# Patient Record
Sex: Female | Born: 1945 | Race: White | Hispanic: No | Smoking: Never smoker
Health system: Southern US, Community
[De-identification: ages and names within clinical notes are randomized; demographics above are authoritative.]

## PROBLEM LIST (undated history)

## (undated) ENCOUNTER — Emergency Department (HOSPITAL_COMMUNITY): Disposition: A | Discharge status: Home / Self Care | Payer: Commercial Managed Care - HMO

## (undated) DIAGNOSIS — Z23 Encounter for immunization: Secondary | ICD-10-CM

## (undated) DIAGNOSIS — I499 Cardiac arrhythmia, unspecified: Secondary | ICD-10-CM

## (undated) DIAGNOSIS — M8580 Other specified disorders of bone density and structure, unspecified site: Secondary | ICD-10-CM

## (undated) DIAGNOSIS — B3789 Other sites of candidiasis: Secondary | ICD-10-CM

## (undated) DIAGNOSIS — F32A Depression, unspecified: Secondary | ICD-10-CM

## (undated) DIAGNOSIS — Z9889 Other specified postprocedural states: Secondary | ICD-10-CM

## (undated) DIAGNOSIS — R200 Anesthesia of skin: Secondary | ICD-10-CM

## (undated) DIAGNOSIS — I10 Essential (primary) hypertension: Secondary | ICD-10-CM

## (undated) DIAGNOSIS — I82402 Acute embolism and thrombosis of unspecified deep veins of left lower extremity: Secondary | ICD-10-CM

## (undated) DIAGNOSIS — K648 Other hemorrhoids: Secondary | ICD-10-CM

## (undated) DIAGNOSIS — F329 Major depressive disorder, single episode, unspecified: Secondary | ICD-10-CM

## (undated) DIAGNOSIS — Z8719 Personal history of other diseases of the digestive system: Secondary | ICD-10-CM

## (undated) DIAGNOSIS — M179 Osteoarthritis of knee, unspecified: Secondary | ICD-10-CM

## (undated) DIAGNOSIS — K219 Gastro-esophageal reflux disease without esophagitis: Secondary | ICD-10-CM

## (undated) DIAGNOSIS — R112 Nausea with vomiting, unspecified: Secondary | ICD-10-CM

## (undated) DIAGNOSIS — J189 Pneumonia, unspecified organism: Secondary | ICD-10-CM

## (undated) DIAGNOSIS — Z9289 Personal history of other medical treatment: Secondary | ICD-10-CM

## (undated) DIAGNOSIS — M1710 Unilateral primary osteoarthritis, unspecified knee: Secondary | ICD-10-CM

## (undated) HISTORY — DX: Acute embolism and thrombosis of unspecified deep veins of left lower extremity: I82.402

## (undated) HISTORY — DX: Other specified disorders of bone density and structure, unspecified site: M85.80

## (undated) HISTORY — DX: Unilateral primary osteoarthritis, unspecified knee: M17.10

## (undated) HISTORY — DX: Major depressive disorder, single episode, unspecified: F32.9

## (undated) HISTORY — DX: Encounter for immunization: Z23

## (undated) HISTORY — PX: OTHER SURGICAL HISTORY: SHX169

## (undated) HISTORY — DX: Essential (primary) hypertension: I10

## (undated) HISTORY — DX: Other hemorrhoids: K64.8

## (undated) HISTORY — PX: CHOLECYSTECTOMY: SHX55

## (undated) HISTORY — DX: Osteoarthritis of knee, unspecified: M17.9

## (undated) HISTORY — DX: Depression, unspecified: F32.A

## (undated) HISTORY — DX: Other sites of candidiasis: B37.89

## (undated) HISTORY — DX: Personal history of other medical treatment: Z92.89

---

## 1999-09-13 ENCOUNTER — Ambulatory Visit (HOSPITAL_COMMUNITY): Admission: RE | Admit: 1999-09-13 | Discharge: 1999-09-13 | Payer: Self-pay | Admitting: Internal Medicine

## 2000-08-24 ENCOUNTER — Other Ambulatory Visit: Admission: RE | Admit: 2000-08-24 | Discharge: 2000-08-24 | Payer: Self-pay | Admitting: Internal Medicine

## 2004-11-02 ENCOUNTER — Ambulatory Visit: Payer: Self-pay | Admitting: Internal Medicine

## 2004-11-16 ENCOUNTER — Ambulatory Visit: Payer: Self-pay | Admitting: Internal Medicine

## 2004-12-21 ENCOUNTER — Ambulatory Visit: Payer: Self-pay | Admitting: Internal Medicine

## 2005-02-15 ENCOUNTER — Ambulatory Visit: Payer: Self-pay | Admitting: Internal Medicine

## 2005-02-22 ENCOUNTER — Encounter (INDEPENDENT_AMBULATORY_CARE_PROVIDER_SITE_OTHER): Payer: Self-pay | Admitting: *Deleted

## 2005-02-22 ENCOUNTER — Ambulatory Visit (HOSPITAL_COMMUNITY): Admission: RE | Admit: 2005-02-22 | Discharge: 2005-02-22 | Payer: Self-pay | Admitting: Internal Medicine

## 2005-03-01 ENCOUNTER — Ambulatory Visit: Payer: Self-pay | Admitting: Internal Medicine

## 2005-04-26 ENCOUNTER — Ambulatory Visit: Payer: Self-pay | Admitting: Internal Medicine

## 2005-09-16 ENCOUNTER — Ambulatory Visit: Payer: Self-pay | Admitting: Internal Medicine

## 2005-09-26 ENCOUNTER — Ambulatory Visit: Payer: Self-pay | Admitting: Internal Medicine

## 2005-10-05 ENCOUNTER — Ambulatory Visit: Payer: Self-pay | Admitting: Internal Medicine

## 2006-02-22 ENCOUNTER — Ambulatory Visit: Payer: Self-pay | Admitting: Hospitalist

## 2006-02-22 ENCOUNTER — Ambulatory Visit (HOSPITAL_COMMUNITY): Admission: RE | Admit: 2006-02-22 | Discharge: 2006-02-22 | Payer: Self-pay | Admitting: Hospitalist

## 2006-03-01 ENCOUNTER — Ambulatory Visit: Payer: Self-pay | Admitting: Internal Medicine

## 2006-04-06 ENCOUNTER — Ambulatory Visit: Payer: Self-pay | Admitting: Internal Medicine

## 2006-04-06 ENCOUNTER — Encounter (INDEPENDENT_AMBULATORY_CARE_PROVIDER_SITE_OTHER): Payer: Self-pay | Admitting: *Deleted

## 2006-04-06 LAB — CONVERTED CEMR LAB
CO2: 25 meq/L (ref 19–32)
Calcium: 9.5 mg/dL (ref 8.4–10.5)
Creatinine, Ser: 0.61 mg/dL (ref 0.40–1.20)
Glucose, Bld: 105 mg/dL — ABNORMAL HIGH (ref 70–99)

## 2006-04-17 ENCOUNTER — Ambulatory Visit (HOSPITAL_COMMUNITY): Admission: RE | Admit: 2006-04-17 | Discharge: 2006-04-17 | Payer: Self-pay | Admitting: Internal Medicine

## 2006-04-19 ENCOUNTER — Ambulatory Visit: Payer: Self-pay | Admitting: Hospitalist

## 2006-04-19 ENCOUNTER — Encounter (INDEPENDENT_AMBULATORY_CARE_PROVIDER_SITE_OTHER): Payer: Self-pay | Admitting: *Deleted

## 2006-04-19 LAB — CONVERTED CEMR LAB
Glucose, Bld: 109 mg/dL — ABNORMAL HIGH (ref 70–99)
LDL Cholesterol: 100 mg/dL — ABNORMAL HIGH (ref 0–99)

## 2006-05-19 ENCOUNTER — Emergency Department (HOSPITAL_COMMUNITY): Admission: EM | Admit: 2006-05-19 | Discharge: 2006-05-19 | Payer: Self-pay | Admitting: Emergency Medicine

## 2006-05-24 ENCOUNTER — Ambulatory Visit (HOSPITAL_COMMUNITY): Admission: RE | Admit: 2006-05-24 | Discharge: 2006-05-24 | Payer: Self-pay | Admitting: Sports Medicine

## 2006-07-03 DIAGNOSIS — I10 Essential (primary) hypertension: Secondary | ICD-10-CM

## 2006-07-03 DIAGNOSIS — M1990 Unspecified osteoarthritis, unspecified site: Secondary | ICD-10-CM | POA: Insufficient documentation

## 2006-07-04 ENCOUNTER — Ambulatory Visit: Payer: Self-pay | Admitting: Internal Medicine

## 2006-07-04 LAB — CONVERTED CEMR LAB
Blood in Urine, dipstick: NEGATIVE
Glucose, Urine, Semiquant: NEGATIVE
Ketones, urine, test strip: NEGATIVE
Specific Gravity, Urine: <1.005
WBC Urine, dipstick: NEGATIVE
pH: 7.5

## 2006-07-11 ENCOUNTER — Ambulatory Visit: Payer: Self-pay | Admitting: Internal Medicine

## 2006-07-11 ENCOUNTER — Encounter (INDEPENDENT_AMBULATORY_CARE_PROVIDER_SITE_OTHER): Payer: Self-pay | Admitting: *Deleted

## 2006-07-11 LAB — CONVERTED CEMR LAB
Chloride: 100 meq/L (ref 96–112)
Creatinine, Ser: 0.64 mg/dL (ref 0.40–1.20)

## 2006-07-17 ENCOUNTER — Emergency Department (HOSPITAL_COMMUNITY): Admission: EM | Admit: 2006-07-17 | Discharge: 2006-07-17 | Payer: Self-pay | Admitting: Family Medicine

## 2006-07-21 ENCOUNTER — Emergency Department (HOSPITAL_COMMUNITY): Admission: EM | Admit: 2006-07-21 | Discharge: 2006-07-21 | Payer: Self-pay | Admitting: Emergency Medicine

## 2006-09-07 ENCOUNTER — Ambulatory Visit: Payer: Self-pay | Admitting: Internal Medicine

## 2006-09-07 DIAGNOSIS — F341 Dysthymic disorder: Secondary | ICD-10-CM

## 2006-12-01 ENCOUNTER — Telehealth (INDEPENDENT_AMBULATORY_CARE_PROVIDER_SITE_OTHER): Payer: Self-pay | Admitting: *Deleted

## 2006-12-29 ENCOUNTER — Ambulatory Visit: Payer: Self-pay | Admitting: Internal Medicine

## 2007-04-09 ENCOUNTER — Ambulatory Visit: Payer: Self-pay | Admitting: Internal Medicine

## 2007-04-09 ENCOUNTER — Encounter (INDEPENDENT_AMBULATORY_CARE_PROVIDER_SITE_OTHER): Payer: Self-pay | Admitting: *Deleted

## 2007-04-09 LAB — CONVERTED CEMR LAB
BUN: 12 mg/dL (ref 6–23)
Chloride: 102 meq/L (ref 96–112)
Potassium: 4.1 meq/L (ref 3.5–5.3)
Sodium: 141 meq/L (ref 135–145)

## 2007-06-18 ENCOUNTER — Ambulatory Visit (HOSPITAL_COMMUNITY): Admission: RE | Admit: 2007-06-18 | Discharge: 2007-06-18 | Payer: Self-pay | Admitting: *Deleted

## 2007-10-15 ENCOUNTER — Ambulatory Visit: Payer: Self-pay | Admitting: *Deleted

## 2007-10-15 ENCOUNTER — Encounter (INDEPENDENT_AMBULATORY_CARE_PROVIDER_SITE_OTHER): Payer: Self-pay | Admitting: *Deleted

## 2007-11-08 ENCOUNTER — Ambulatory Visit: Payer: Self-pay | Admitting: Internal Medicine

## 2007-11-08 ENCOUNTER — Encounter (INDEPENDENT_AMBULATORY_CARE_PROVIDER_SITE_OTHER): Payer: Self-pay | Admitting: *Deleted

## 2008-03-03 ENCOUNTER — Encounter (INDEPENDENT_AMBULATORY_CARE_PROVIDER_SITE_OTHER): Payer: Self-pay | Admitting: *Deleted

## 2008-03-03 ENCOUNTER — Ambulatory Visit: Payer: Self-pay | Admitting: Internal Medicine

## 2008-04-04 ENCOUNTER — Ambulatory Visit: Payer: Self-pay | Admitting: Internal Medicine

## 2008-04-17 ENCOUNTER — Encounter (INDEPENDENT_AMBULATORY_CARE_PROVIDER_SITE_OTHER): Payer: Self-pay | Admitting: *Deleted

## 2008-04-17 ENCOUNTER — Ambulatory Visit: Payer: Self-pay | Admitting: *Deleted

## 2008-04-17 LAB — CONVERTED CEMR LAB
Albumin: 3.9 g/dL (ref 3.5–5.2)
Alkaline Phosphatase: 62 units/L (ref 39–117)
BUN: 10 mg/dL (ref 6–23)
CO2: 26 meq/L (ref 19–32)
Calcium: 9.3 mg/dL (ref 8.4–10.5)
Chloride: 104 meq/L (ref 96–112)
Cholesterol: 163 mg/dL (ref 0–200)
Glucose, Bld: 95 mg/dL (ref 70–99)
HDL: 52 mg/dL (ref >39)
Potassium: 4 meq/L (ref 3.5–5.3)
Sodium: 142 meq/L (ref 135–145)
Total Protein: 6.8 g/dL (ref 6.0–8.3)
Triglycerides: 188 mg/dL — ABNORMAL HIGH (ref <150)

## 2008-05-05 ENCOUNTER — Ambulatory Visit (HOSPITAL_COMMUNITY): Admission: RE | Admit: 2008-05-05 | Discharge: 2008-05-05 | Payer: Self-pay | Admitting: Infectious Diseases

## 2008-05-08 ENCOUNTER — Ambulatory Visit (HOSPITAL_COMMUNITY): Admission: RE | Admit: 2008-05-08 | Discharge: 2008-05-08 | Payer: Self-pay | Admitting: Infectious Diseases

## 2008-05-08 ENCOUNTER — Encounter (INDEPENDENT_AMBULATORY_CARE_PROVIDER_SITE_OTHER): Payer: Self-pay | Admitting: *Deleted

## 2008-06-10 ENCOUNTER — Telehealth (INDEPENDENT_AMBULATORY_CARE_PROVIDER_SITE_OTHER): Payer: Self-pay | Admitting: *Deleted

## 2008-06-30 ENCOUNTER — Ambulatory Visit: Payer: Self-pay | Admitting: Internal Medicine

## 2008-06-30 ENCOUNTER — Encounter (INDEPENDENT_AMBULATORY_CARE_PROVIDER_SITE_OTHER): Payer: Self-pay | Admitting: *Deleted

## 2008-06-30 LAB — HM PAP SMEAR: HM Pap smear: NEGATIVE

## 2008-12-24 ENCOUNTER — Ambulatory Visit (HOSPITAL_COMMUNITY): Admission: RE | Admit: 2008-12-24 | Discharge: 2008-12-24 | Payer: Self-pay | Admitting: Internal Medicine

## 2008-12-24 ENCOUNTER — Ambulatory Visit: Payer: Self-pay | Admitting: Internal Medicine

## 2008-12-24 DIAGNOSIS — M25519 Pain in unspecified shoulder: Secondary | ICD-10-CM

## 2009-01-01 ENCOUNTER — Ambulatory Visit: Payer: Self-pay | Admitting: Internal Medicine

## 2009-01-02 ENCOUNTER — Ambulatory Visit: Payer: Self-pay | Admitting: Family Medicine

## 2009-01-08 ENCOUNTER — Ambulatory Visit (HOSPITAL_COMMUNITY): Admission: RE | Admit: 2009-01-08 | Discharge: 2009-01-08 | Payer: Self-pay | Admitting: Internal Medicine

## 2009-03-30 ENCOUNTER — Ambulatory Visit: Payer: Self-pay | Admitting: Internal Medicine

## 2009-03-30 DIAGNOSIS — K921 Melena: Secondary | ICD-10-CM

## 2009-03-30 LAB — CONVERTED CEMR LAB
ALT: 15 units/L (ref 0–35)
AST: 16 units/L (ref 0–37)
BUN: 10 mg/dL (ref 6–23)
Calcium: 9.4 mg/dL (ref 8.4–10.5)
Chloride: 100 meq/L (ref 96–112)
Creatinine, Ser: 0.62 mg/dL (ref 0.40–1.20)
HCT: 39.7 % (ref 36.0–46.0)
HDL: 62 mg/dL (ref >39)
Hemoglobin: 13 g/dL (ref 12.0–15.0)
MCHC: 32.7 g/dL (ref 30.0–36.0)
MCV: 91.3 fL (ref >=78.0)
Platelets: 332 10*3/uL (ref 150–400)
RDW: 13.8 % (ref 11.5–15.5)
Total Bilirubin: 0.4 mg/dL (ref 0.3–1.2)
Total CHOL/HDL Ratio: 3.3
VLDL: 39 mg/dL (ref 0–40)

## 2009-07-21 ENCOUNTER — Telehealth: Payer: Self-pay | Admitting: *Deleted

## 2009-07-21 ENCOUNTER — Encounter (INDEPENDENT_AMBULATORY_CARE_PROVIDER_SITE_OTHER): Payer: Self-pay | Admitting: Internal Medicine

## 2009-09-02 ENCOUNTER — Telehealth (INDEPENDENT_AMBULATORY_CARE_PROVIDER_SITE_OTHER): Payer: Self-pay | Admitting: Internal Medicine

## 2009-09-15 ENCOUNTER — Ambulatory Visit: Payer: Self-pay | Admitting: Internal Medicine

## 2009-09-15 LAB — CONVERTED CEMR LAB
ALT: 29 units/L (ref 0–35)
AST: 22 units/L (ref 0–37)
Alkaline Phosphatase: 74 units/L (ref 39–117)
Basophils Relative: 0 % (ref 0–1)
Creatinine, Ser: 0.6 mg/dL (ref 0.40–1.20)
Eosinophils Absolute: 0.3 10*3/uL (ref 0.0–0.7)
MCHC: 32.7 g/dL (ref 30.0–36.0)
MCV: 91.3 fL (ref >=78.0)
Monocytes Relative: 5 % (ref 3–12)
Neutro Abs: 5.2 10*3/uL (ref 1.7–7.7)
Neutrophils Relative %: 65 % (ref 43–77)
Platelets: 326 10*3/uL (ref 150–400)
RBC: 4.25 M/uL (ref 3.87–5.11)
Sodium: 139 meq/L (ref 135–145)
Total Bilirubin: 0.4 mg/dL (ref 0.3–1.2)
Total Protein: 7 g/dL (ref 6.0–8.3)
WBC: 7.9 10*3/uL (ref 4.0–10.5)

## 2009-09-28 ENCOUNTER — Ambulatory Visit: Payer: Self-pay | Admitting: Family Medicine

## 2010-02-22 ENCOUNTER — Ambulatory Visit: Payer: Self-pay | Admitting: Internal Medicine

## 2010-05-03 ENCOUNTER — Telehealth: Payer: Self-pay | Admitting: Internal Medicine

## 2010-06-07 ENCOUNTER — Ambulatory Visit: Payer: Self-pay | Admitting: Internal Medicine

## 2010-06-07 DIAGNOSIS — G4700 Insomnia, unspecified: Secondary | ICD-10-CM | POA: Insufficient documentation

## 2010-06-08 LAB — CONVERTED CEMR LAB
BUN: 9 mg/dL (ref 6–23)
Creatinine, Ser: 0.61 mg/dL (ref 0.40–1.20)
Eosinophils Absolute: 0.4 10*3/uL (ref 0.0–0.7)
Eosinophils Relative: 4 % (ref 0–5)
Glucose, Bld: 102 mg/dL — ABNORMAL HIGH (ref 70–99)
HCT: 41.5 % (ref 36.0–46.0)
Hemoglobin: 13.4 g/dL (ref 12.0–15.0)
Lymphs Abs: 2.6 10*3/uL (ref 0.7–4.0)
MCHC: 32.3 g/dL (ref 30.0–36.0)
MCV: 92.4 fL (ref 78.0–100.0)
Monocytes Absolute: 0.4 10*3/uL (ref 0.1–1.0)
Monocytes Relative: 5 % (ref 3–12)
Neutrophils Relative %: 60 % (ref 43–77)
Potassium: 3.9 meq/L (ref 3.5–5.3)
RBC: 4.49 M/uL (ref 3.87–5.11)
WBC: 8.5 10*3/uL (ref 4.0–10.5)

## 2010-07-06 NOTE — Assessment & Plan Note (Signed)
Summary: EST-CK/FU/MEDS/CFB   Vital Signs:  Patient profile:   65 year old female Height:      63 inches (160.02 cm) Weight:      236.8 pounds (107.64 kg) BMI:     42.10 Temp:     98.0 degrees F oral Pulse rate:   76 / minute BP sitting:   114 / 65  (right arm)  Vitals Entered By: Chinita Pester RN (September 15, 2009 3:57 PM) CC: Medication refills.  Referral to Sports Med. forsteroid injections. Is Patient Diabetic? No Pain Assessment Patient in pain? yes     Location: right shoulder Intensity: 9 Type: sharp Onset of pain  Chronic Nutritional Status BMI of > 30 = obese  Have you ever been in a relationship where you felt threatened, hurt or afraid?No   Does patient need assistance? Functional Status Self care Ambulation Normal   Primary Care Provider:  Olene Craven MD  CC:  Medication refills.  Referral to Sports Med. forsteroid injections..  History of Present Illness: 65yof with pmh outlined in this chart.  She is here because her right shoulder hurts again due to arthritis, she went to dr. Jennette Kettle for this and recieved steriod injection earlier this year.  She has had total relief until recently and would like to go back for another injection.  Otherwise she is doing great.  States that she has taken the stress out of her life by banning her mother from her house and it has made a huge difference in her outlook.    Depression History:      The patient denies a depressed mood most of the day and a diminished interest in her usual daily activities.         Preventive Screening-Counseling & Management  Alcohol-Tobacco     Alcohol drinks/day: 0     Smoking Status: never  Caffeine-Diet-Exercise     Does Patient Exercise: yes     Type of exercise: WALKING     Times/week: 5  Current Medications (verified): 1)  Hydrochlorothiazide 25 Mg Tabs (Hydrochlorothiazide) .... Take 1 Tablet By Mouth Once A Day 2)  Lisinopril 10 Mg Tabs (Lisinopril) .... Take 1 Tablet By  Mouth Once A Day 3)  Alprazolam 0.5 Mg  Tabs (Alprazolam) .Marland Kitchen.. 1 Tablet At Bedtime As Needed  Allergies (verified): 1)  ! Celebrex 2)  Pcn  Review of Systems       per hpi  Physical Exam  General:  overweight-appearing.   Head:  normocephalic and atraumatic.   Eyes:  vision grossly intact, pupils equal, pupils round, and pupils reactive to light.   Mouth:  good dentition and pharynx pink and moist.   Lungs:  normal respiratory effort and normal breath sounds.   Heart:  normal rate, regular rhythm, no murmur, and no gallop.   Msk:  pain with movment of the right shoulder, no warmth, redness or obvious effusion Pulses:  +1 Extremities:  no edema Neurologic:  alert & oriented X3, cranial nerves II-XII intact, and gait normal.   Psych:  Oriented X3, memory intact for recent and remote, normally interactive, good eye contact, and not anxious appearing.     Impression & Recommendations:  Problem # 1:  HYPERTENSION (ICD-401.9) BP is great, no changes.   Her updated medication list for this problem includes:    Hydrochlorothiazide 25 Mg Tabs (Hydrochlorothiazide) .Marland Kitchen... Take 1 tablet by mouth once a day    Lisinopril 10 Mg Tabs (Lisinopril) .Marland Kitchen... Take 1 tablet by mouth  once a day  BP today: 114/65 Prior BP: 147/75 (03/30/2009)  Labs Reviewed: K+: 4.0 (03/30/2009) Creat: : 0.62 (03/30/2009)   Chol: 206 (03/30/2009)   HDL: 62 (03/30/2009)   LDL: 105 (03/30/2009)   TG: 193 (03/30/2009)  Problem # 2:  SHOULDER PAIN, RIGHT (ICD-719.41) Refer back to sports medicine as this was very effective in the past.  Orders: Sports Medicine (Sports Med)  Problem # 3:  OBESITY (ICD-278.00) She is frustrated that she is exercising and eating well, but can not lose weight.   Orders: T-Comprehensive Metabolic Panel [phone]) T-TSH [phone]) T-T4, Free [phone])  Problem # 4:  BLOOD IN STOOL (ICD-578.1) check cbc  Orders: T-CBC w/Diff ([id number]) Sports Medicine (Sports  Med)  Problem # 5:  ANXIETY DEPRESSION (ICD-300.4) much improved.  seems to have made major strides in dealing with her family dynamics.  Complete Medication List: 1)  Hydrochlorothiazide 25 Mg Tabs (Hydrochlorothiazide) .... Take 1 tablet by mouth once a day 2)  Lisinopril 10 Mg Tabs (Lisinopril) .... Take 1 tablet by mouth once a day 3)  Alprazolam 0.5 Mg Tabs (Alprazolam) .Marland Kitchen.. 1 tablet at bedtime as needed  Other Orders: T-Hemoccult Card-Multiple (take home) ([id number])  Patient Instructions: 1)  You will be refered to Sports medicine for shoulder injections. 2)  You had labwork done today, we will call you if there is anything that needs to be addressed before your next appointment. 3)  Please return the hemocult cards. 4)  Please schedule a follow-up appointment in 6 months. Prescriptions: ALPRAZOLAM 0.5 MG  TABS (ALPRAZOLAM) 1 tablet at bedtime as needed  #30 x 3   Entered and Authorized by:   Elby Showers MD   Signed by:   Elby Showers MD on 09/15/2009   Method used:   Telephoned to ...       [facility] [address] [phone]* (retail)       [address]       [place], [place]  [address]       Ph: [phone]       Fax: [phone]   RxID:   [id number]   Prevention & Chronic Care Immunizations   Influenza vaccine: Fluvax 3+  (03/30/2009)    Tetanus booster: Not documented    Pneumococcal vaccine: Not documented    H. zoster vaccine: Not documented  Colorectal Screening   Hemoccult: Not documented   Hemoccult action/deferral: Ordered  (09/15/2009)    Colonoscopy: Not documented   Colonoscopy action/deferral: GI referral  (03/30/2009)  Other Screening   Pap smear:  Specimen Adequacy: Satisfactory for evaluation.   Interpretation/Result:Negative for intraepithelial Lesion or Malignancy.     (06/30/2008)   Pap smear due: 06/30/2010    Mammogram: ASSESSMENT: Negative - BI-RADS 1^MM DIGITAL SCREENING  (01/08/2009)   Mammogram action/deferral: Deferred-2 yr interval   (01/01/2009)    DXA bone density scan: Not documented   Smoking status: never  (09/15/2009)  Lipids   Total Cholesterol: 206  (03/30/2009)   Lipid panel action/deferral: Lipid Panel ordered   LDL: 105  (03/30/2009)   LDL Direct: Not documented   HDL: 62  (03/30/2009)   Triglycerides: 193  (03/30/2009)  Hypertension   Last Blood Pressure: 114 / 65  (09/15/2009)   Serum creatinine: 0.62  (03/30/2009)   Serum potassium 4.0  (03/30/2009) CMP ordered     Hypertension flowsheet reviewed?: Yes   Progress toward BP goal: At goal  Self-Management Support :   Personal Goals (by the next clinic visit) :  Personal blood pressure goal: 140/90  (09/15/2009)   Patient will work on the following items until the next clinic visit to reach self-care goals:     Medications and monitoring: take my medicines every day, bring all of my medications to every visit  (09/15/2009)     Eating: eat more vegetables, use fresh or frozen vegetables, eat foods that are low in salt, eat baked foods instead of fried foods  (09/15/2009)     Activity: take a 30 minute walk every day  (09/15/2009)    Hypertension self-management support: Written self-care plan, Education handout, Resources for patients handout  (09/15/2009)   Hypertension self-care plan printed.   Hypertension education handout printed      Resource handout printed.   Nursing Instructions: Provide Hemoccult cards with instructions (see order)   Process Orders Check Orders Results:     [facility]: ABN not required for this insurance Tests Sent for requisitioning (September 17, 2009 9:09 AM):     09/15/2009: [facility] -- T-Comprehensive Metabolic Panel [[id number]] (signed)     09/15/2009: [facility] -- T-TSH [phone] (signed)     09/15/2009: [facility] -- [place], [place] [[id number]] (signed)     09/15/2009: [facility] -- T-CBC w/Diff [[id number]]  (signed)

## 2010-07-06 NOTE — Progress Notes (Signed)
Summary: refill/ hla  Phone Note Refill Request Message from:  Patient on May 03, 2010 10:29 AM  Refills Requested: Medication #1:  ALPRAZOLAM 0.5 MG  TABS 1 tablet at bedtime as needed.   Dosage confirmed as above?Dosage Confirmed   Brand Name Necessary? No   Supply Requested: 1 month   Last Refilled: 09/15/2009 last visit 09/2009  Initial call taken by: Marin Roberts RN,  May 03, 2010 10:30 AM  Follow-up for Phone Call        Rx denied because i have never seen the patient and would like to meet her and discuss about starting her on SSRI's for anxiety rather then refilling benzo. Follow-up by: [name] MD,  May 03, 2010 1:35 PM  Additional Follow-up for Phone Call Additional follow up Details #1::        Pt has appointment with you on 12/5. Will you fill until then? Additional Follow-up by: Merrie Roof RN,  May 03, 2010 2:25 PM    Additional Follow-up for Phone Call Additional follow up Details #2::    Rx approved for a month. Please call in the prescription. Follow-up by: [name] MD,  May 03, 2010 4:13 PM  Additional Follow-up for Phone Call Additional follow up Details #3:: Details for Additional Follow-up Action Taken: Rx called in Additional Follow-up by: Merrie Roof RN,  May 03, 2010 5:12 PM  Prescriptions: ALPRAZOLAM 0.5 MG  TABS (ALPRAZOLAM) 1 tablet at bedtime as needed  #30 x 0   Entered and Authorized by:   [name] MD   Signed by:   [name] MD on 05/03/2010   Method used:   Telephoned to ...       [facility] [address] [phone]* (retail)       [address]       [place], [place]  [address]       Ph: [phone]       Fax: [phone]   RxID:   [id number]

## 2010-07-06 NOTE — Assessment & Plan Note (Signed)
Summary: Lori Bradford   Primary Care Provider:  Olene Craven MD   History of Present Illness: f/u right shoulder pain. I gave her injection last June--has lasted until about a month ago. Now having pain again w overhead motions., esp getting anything off high shelf. no numbness or tingling in hand or arm. Has had no intercurrent injury. Pain relief last time was 100%. woudl like 2nd injection today  Allergies: 1)  ! Celebrex 2)  Pcn  Physical Exam  Additional Exam:  Patient given informed consent for injection. Discussed possible complications of infection, bleeding or skin atrophy at site of injection. Possible side effect of avascular necrosis (focal area of bone death) due to steroid use.Appropriate verbal time out taken Are cleaned and prepped in usual sterile fashion. A --1-- cc kennalog plus --4--cc 1% lidocaine without epinephrine was injected into the-right subacromial bursa--. Patient tolerated procedure well with no complications.    Shoulder/Elbow Exam  Shoulder Exam:    Right:    Inspection:  Normal       Location:  deltoid    Stability:  stable    Swelling:  no    decreased ROM above 110 degrees in lateral ABduction, can forward flex to 100 degrees. IR/ERE intact. distally NV intact   Impression & Recommendations:  Problem # 1:  SHOULDER PAIN, RIGHT (ICD-719.41)  return of subacromial bursitis injection therapy today rec she continue exercises, esp ROm. We discussed how often she should have injections--as infrequently as possible.  Orders: Joint Aspirate / Injection, Large ([id number])  Complete Medication List: 1)  Hydrochlorothiazide 25 Mg Tabs (Hydrochlorothiazide) .... Take 1 tablet by mouth once a day 2)  Lisinopril 10 Mg Tabs (Lisinopril) .... Take 1 tablet by mouth once a day 3)  Alprazolam 0.5 Mg Tabs (Alprazolam) .Marland Kitchen.. 1 tablet at bedtime as needed

## 2010-07-06 NOTE — Letter (Addendum)
Summary: Referral Appt.:  GI Appt. Notice  Referral Appt.:  GI Appt. Notice   Imported By: Florinda Marker 07/24/2009 14:15:26  _____________________________________________________________________  External Attachment:    Type:   Image     Comment:   External Document  Appended Document: Referral Appt.:  GI Appt. Notice Patient cancelled this appointment with Dr Dulce Sellar and never rescheduled, as per Dr Hulen Shouts office.

## 2010-07-06 NOTE — Assessment & Plan Note (Signed)
Summary: FLU SHOT/CFB  Nurse Visit   Allergies: 1)  ! Celebrex 2)  Pcn  Orders Added: 1)  Admin 1st Vaccine [90471] 2)  Flu Vaccine 13yrs + [62831] Flu Vaccine Consent Questions     Do you have a history of severe allergic reactions to this vaccine? no    Any prior history of allergic reactions to egg and/or gelatin? no    Do you have a sensitivity to the preservative Thimersol? no    Do you have a past history of Guillan-Barre Syndrome? no    Do you currently have an acute febrile illness? no    Have you ever had a severe reaction to latex? no    Vaccine information given and explained to patient? yes    Are you currently pregnant? no    Lot Number:[id number]   Exp Date:12/04/2010..leftdeltoid.Marland Kitchen  Marland KitchenCynda Familia [facility])  February 22, 2010 10:36 AM sign  .opcflu

## 2010-07-06 NOTE — Progress Notes (Signed)
  Phone Note Outgoing Call   Call placed by: Theotis Barrio NT II,  July 21, 2009 4:25 PM Call placed to: Patient Details for Reason: [facility] Summary of Call: CALLED PATIENT AND LEFT VOICE MESSAGE FOR THE PATIENT TO GIVE ME A CALL BACK AT [phone].  APPT WITH [facility]-DR OUTLAW FEB. 22, 011 @ 10:15AM FOR A CONSULTATION ONLY.  RECORDS FAXED TO [phone].  APPT REMINDER LETTER ALSO MAILED TO THE PATIENT.Lela Sturdivant NT II  July 21, 2009 4:28 PM

## 2010-07-06 NOTE — Progress Notes (Signed)
Summary: refill/ hla  Phone Note Refill Request Message from:  Fax from Pharmacy on September 02, 2009 4:06 PM  Refills Requested: Medication #1:  HYDROCHLOROTHIAZIDE 25 MG TABS Take 1 tablet by mouth once a day   Last Refilled: 12/29  Medication #2:  LISINOPRIL 10 MG TABS Take 1 tablet by mouth once a day   Last Refilled: 12/29 Initial call taken by: Marin Roberts RN,  September 02, 2009 4:06 PM    Prescriptions: LISINOPRIL 10 MG TABS (LISINOPRIL) Take 1 tablet by mouth once a day  #90 x 4   Entered and Authorized by:   Elby Showers MD   Signed by:   Elby Showers MD on 09/02/2009   Method used:   Electronically to        [facility] (retail)       [address]       [place], [place]  [address]       Ph: [phone]       Fax: [phone]   RxID:   [id number] HYDROCHLOROTHIAZIDE 25 MG TABS (HYDROCHLOROTHIAZIDE) Take 1 tablet by mouth once a day  #90 x 4   Entered and Authorized by:   Elby Showers MD   Signed by:   Elby Showers MD on 09/02/2009   Method used:   Electronically to        [facility] (retail)       [address]       [place], [place]  [address]       Ph: [phone]       Fax: [phone]   RxID:   [id number]
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
## 2010-07-14 ENCOUNTER — Other Ambulatory Visit: Payer: Self-pay | Admitting: *Deleted

## 2010-07-19 ENCOUNTER — Telehealth: Payer: Self-pay | Admitting: *Deleted

## 2010-07-19 MED ORDER — ALPRAZOLAM 0.5 MG PO TABS: 0.5000 mg | ORAL_TABLET | Freq: Every evening | ORAL | Status: DC | PRN

## 2010-07-19 NOTE — Telephone Encounter (Signed)
Encounter opened in error

## 2010-07-19 NOTE — Telephone Encounter (Signed)
Rx faxed to pharmacy  

## 2010-08-02 ENCOUNTER — Ambulatory Visit: Payer: Self-pay | Admitting: Internal Medicine

## 2010-08-05 ENCOUNTER — Encounter: Payer: Self-pay | Admitting: Internal Medicine

## 2010-08-05 ENCOUNTER — Ambulatory Visit (INDEPENDENT_AMBULATORY_CARE_PROVIDER_SITE_OTHER): Payer: Self-pay | Admitting: Internal Medicine

## 2010-08-05 DIAGNOSIS — K625 Hemorrhage of anus and rectum: Secondary | ICD-10-CM
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
## 2010-08-12 NOTE — Letter (Signed)
Summary: [facility] Instructions  [facility]  [address] [place], [place] [address]   Phone: [phone]  Fax: [phone]       Lori Bradford    [birth year]    MRN: [id number]   Procedure Day /Date: Tuesday 08/31/10     Arrival Time: 7:30 am     Procedure Time: 8:30 am     Location of Procedure:                    _ x_  [facility] (4th Floor)  PREPARATION FOR COLONOSCOPY WITH MOVIPREP   Starting 5 days prior to your procedure 08/26/10 do not eat nuts, seeds, popcorn, corn, beans, peas,  salads, or any raw vegetables.  Do not take any fiber supplements (e.g. Metamucil, Citrucel, and Benefiber).  THE DAY BEFORE YOUR PROCEDURE         DATE: 08/30/10  DAY: Tuesday  1.  Drink clear liquids the entire day-NO SOLID FOOD  2.  Do not drink anything colored red or purple.  Avoid juices with pulp.  No orange juice.  3.  Drink at least 64 oz. (8 glasses) of fluid/clear liquids during the day to prevent dehydration and help the prep work efficiently.  CLEAR LIQUIDS INCLUDE: Water Jello Ice Popsicles Tea (sugar ok, no milk/cream) Powdered fruit flavored drinks Coffee (sugar ok, no milk/cream) Gatorade Juice: apple, white grape, white cranberry  Lemonade Clear bullion, consomm, broth Carbonated beverages (any kind) Strained chicken noodle soup Hard Candy                             4.  In the morning, mix first dose of MoviPrep solution:    Empty 1 Pouch A and 1 Pouch B into the disposable container    Add lukewarm drinking water to the top line of the container. Mix to dissolve    Refrigerate (mixed solution should be used within 24 hrs)  5.  Begin drinking the prep at 5:00 p.m. The MoviPrep container is divided by 4 marks.   Every 15 minutes drink the solution down to the next mark (approximately 8 oz) until the full liter is complete.   6.  Follow completed prep with 16 oz of clear liquid of your choice (Nothing red or purple).  Continue to  drink clear liquids until bedtime.  7.  Before going to bed, mix second dose of MoviPrep solution:    Empty 1 Pouch A and 1 Pouch B into the disposable container    Add lukewarm drinking water to the top line of the container. Mix to dissolve    Refrigerate  THE DAY OF YOUR PROCEDURE      DATE: 08/31/10 DAY: Tuesday  Beginning at 3:30 a.m. (5 hours before procedure):         1. Every 15 minutes, drink the solution down to the next mark (approx 8 oz) until the full liter is complete.  2. Follow completed prep with 16 oz. of clear liquid of your choice.    3. You may drink clear liquids until 6:30 am (2 HOURS BEFORE PROCEDURE).   MEDICATION INSTRUCTIONS  Unless otherwise instructed, you should take regular prescription medications with a small sip of water   as early as possible the morning of your procedure.        OTHER INSTRUCTIONS  You will need a responsible adult at least 64 years of age to accompany you  and drive you home.   This person must remain in the waiting room during your procedure.  Wear loose fitting clothing that is easily removed.  Leave jewelry and other valuables at home.  However, you may wish to bring a book to read or  an iPod/MP3 player to listen to music as you wait for your procedure to start.  Remove all body piercing jewelry and leave at home.  Total time from sign-in until discharge is approximately 2-3 hours.  You should go home directly after your procedure and rest.  You can resume normal activities the  day after your procedure.  The day of your procedure you should not:   Drive   Make legal decisions   Operate machinery   Drink alcohol   Return to work  You will receive specific instructions about eating, activities and medications before you leave.    The above instructions have been reviewed and explained to me by   _______________________    I fully understand and can verbalize these instructions  _____________________________ Date _________

## 2010-08-26 ENCOUNTER — Encounter: Payer: Self-pay | Admitting: Internal Medicine

## 2010-08-31 ENCOUNTER — Other Ambulatory Visit: Payer: Self-pay | Admitting: Internal Medicine

## 2010-08-31 ENCOUNTER — Other Ambulatory Visit: Payer: Self-pay | Admitting: *Deleted

## 2010-08-31 MED ORDER — ALPRAZOLAM 0.5 MG PO TABS: 0.5000 mg | ORAL_TABLET | Freq: Every evening | ORAL | Status: DC | PRN

## 2010-08-31 NOTE — Telephone Encounter (Signed)
Can you please give pt 5 refills on this med.   She has to call in each month and is delayed in getting meds.

## 2010-08-31 NOTE — Telephone Encounter (Signed)
Please make an appointment with me next month to assess need for long term xanax.

## 2010-09-01 NOTE — Telephone Encounter (Signed)
Appointment made

## 2010-09-01 NOTE — Telephone Encounter (Signed)
Rx called in to pharmacy. Pt called and message left X2 to call clinic and make appointment

## 2010-09-06 ENCOUNTER — Encounter: Payer: Self-pay | Admitting: Internal Medicine

## 2010-09-06 ENCOUNTER — Ambulatory Visit (INDEPENDENT_AMBULATORY_CARE_PROVIDER_SITE_OTHER): Payer: Self-pay | Admitting: Internal Medicine

## 2010-09-06 DIAGNOSIS — G4700 Insomnia, unspecified: Secondary | ICD-10-CM

## 2010-09-06 DIAGNOSIS — E669 Obesity, unspecified: Secondary | ICD-10-CM

## 2010-09-06 DIAGNOSIS — F341 Dysthymic disorder: Secondary | ICD-10-CM

## 2010-09-06 DIAGNOSIS — I10 Essential (primary) hypertension: Secondary | ICD-10-CM

## 2010-09-06 DIAGNOSIS — M1990 Unspecified osteoarthritis, unspecified site: Secondary | ICD-10-CM

## 2010-09-06 MED ORDER — MELOXICAM 7.5 MG PO TABS: 7.5000 mg | ORAL_TABLET | Freq: Every day | ORAL | Status: DC

## 2010-09-06 MED ORDER — FLUOXETINE HCL 20 MG PO TABS: 20.0000 mg | ORAL_TABLET | Freq: Every day | ORAL | Status: DC

## 2010-09-06 NOTE — Assessment & Plan Note (Signed)
Patient was started on Prozac today 30 mg daily at nighttime I explained in detail the benefits and side effects of Prozac. Patient needs to be continued on Prozac for at least 4-6 months before any other adjustment. I would refill patient's Xanax until that period of time.

## 2010-09-06 NOTE — Assessment & Plan Note (Signed)
Well controlled at this point of time. Patient is not due for any blood tests today

## 2010-09-06 NOTE — Progress Notes (Signed)
  Subjective:    Patient ID: Lori Bradford, female    DOB: [birth year], 65 y.o.   MRN: [id number]  HPI [name] in the 65 year old white female with past medical history as described in the EPIC. This is a regular followup appointment.  Her blood pressure is well-controlled at 136/68 today and she is taking all her blood pressure medications as prescribed.  The patient has a generalized anxiety disorder and has been taking Xanax 0.5 mg as needed at night for sleeping purposes. I looked back at [facility] notes and this was started by Dr. Higinio Roger, only for a short-term measure but ultimately somehow got continued for last 4 years. Patient wants me to put in 3-4 months refill at a time because she uses it almost every day for sleeping purposes. Patient says she doesn't have any suicidal or homicidal ideations and uses the medication only as a sleep aid.  Patient is due for colonoscopy but denies.  Patient complains of pain in her bilateral knees left more than right. She has osteoarthritis and uses Aleve 1 or 2 tablets every day. Patient wants some other medications to be prescribed at this point of time.  Patient is due for a Zostavax but she says that she would rather wait until she turned 50 and didn't reimburse since she cannot afford to pay for it right now.  No other complaints at this time.    Review of Systems  Constitutional: Negative for fever, activity change and appetite change.  HENT: Negative for sore throat.   Respiratory: Negative for cough and shortness of breath.   Cardiovascular: Negative for chest pain and leg swelling.  Gastrointestinal: Negative for nausea, abdominal pain, diarrhea, constipation and abdominal distention.  Genitourinary: Negative for frequency, hematuria and difficulty urinating.  Neurological: Negative for dizziness and headaches.  Psychiatric/Behavioral: Negative for suicidal ideas and behavioral problems.       Objective:   Physical Exam   Constitutional: She is oriented to person, place, and time. She appears well-developed and well-nourished.  HENT:  Head: Normocephalic and atraumatic.  Eyes: Conjunctivae and EOM are normal. Pupils are equal, round, and reactive to light. No scleral icterus.  Neck: Normal range of motion. Neck supple. No JVD present. No thyromegaly present.  Cardiovascular: Normal rate, regular rhythm, normal heart sounds and intact distal pulses.  Exam reveals no gallop and no friction rub.   No murmur heard. Pulmonary/Chest: Effort normal and breath sounds normal. No respiratory distress. She has no wheezes. She has no rales.  Abdominal: Soft. Bowel sounds are normal. She exhibits no distension and no mass. There is no tenderness. There is no rebound and no guarding.  Musculoskeletal: Normal range of motion. She exhibits no edema and no tenderness.  Lymphadenopathy:    She has no cervical adenopathy.  Neurological: She is alert and oriented to person, place, and time.  Psychiatric: She has a normal mood and affect. Her behavior is normal.          Assessment & Plan:

## 2010-09-06 NOTE — Assessment & Plan Note (Signed)
I would refill her Xanax at this point of time but planned to taper it down once the anxiety medication that is exercise kicks.

## 2010-09-06 NOTE — Assessment & Plan Note (Signed)
Discussed in detail today patient is up almost 80 pounds above her ideal body weight she says that she would start working on it once her pain gets but controlled.

## 2010-09-06 NOTE — Patient Instructions (Signed)
Arthritis - Degenerative, Osteoarthritis You have osteoarthritis. This is the wear and tear arthritis that comes with aging. It is also called degenerative arthritis. This is common in people past middle age. It is caused by stress on the joints from living. The large weight bearing joints of the lower extremities are most often affected. The knees, hips, back, neck, and hands can become painful, swollen, and stiff. This is the most common type of arthritis. It comes on with age, carrying too much weight, and from injury. Treatment includes resting the sore joint until the pain and swelling improve. Crutches or a walker may be needed for severe flares. Only take over-the-counter or prescription medicines for pain, discomfort, or fever as directed by your caregiver. Local heat therapy may improve motion. Cortisone shots into the joint are sometimes used to reduce pain and swelling during flares. Osteoarthritis is usually not crippling and progresses slowly. There are things you can do to decrease pain:  Avoid high impact activities.   Exercise regularly.   Low impact exercises such as walking, biking and swimming help to keep the muscles strong and keep normal joint function.   Stretching helps to keep your range of motion.   Lose weight if you are overweight. This reduces joint stress.  In severe cases when you have pain at rest or increasing disability, joint surgery may be helpful. See your caregiver for follow-up treatment as recommended.  SEEK IMMEDIATE MEDICAL CARE IF:  You have severe joint pain.   Marked swelling and redness in your joint develops.   You develop a high fever.  Document Released: 05/23/2005 Document Re-Released: 09/22/2007 [employer] Patient Information 2011 [employer]. 

## 2010-09-06 NOTE — Assessment & Plan Note (Signed)
I would start patient on meloxicam 7.5 mg tablet once a day in the morning as it'll provide a relatively longer period of relief for her. Patient plans to undergo left total knee replacement once her Medicare takes them at 65 years of age. Patient has seen an orthopedic in the past.

## 2010-10-07 ENCOUNTER — Other Ambulatory Visit: Payer: Self-pay | Admitting: Sports Medicine

## 2010-10-09 ENCOUNTER — Other Ambulatory Visit: Payer: Self-pay | Admitting: Sports Medicine

## 2010-10-11 ENCOUNTER — Other Ambulatory Visit: Payer: Self-pay | Admitting: *Deleted

## 2010-10-11 MED ORDER — ALPRAZOLAM 0.5 MG PO TABS: 0.5000 mg | ORAL_TABLET | Freq: Every evening | ORAL | Status: DC | PRN

## 2010-10-11 NOTE — Telephone Encounter (Signed)
Dr. Eben Burow doesn't want her on this long term. He can refill any he wants further.

## 2010-10-11 NOTE — Telephone Encounter (Signed)
Pt request 5 refills because she doesn't like calling every month

## 2010-10-12 ENCOUNTER — Other Ambulatory Visit: Payer: Self-pay | Admitting: Sports Medicine

## 2010-10-12 NOTE — Telephone Encounter (Signed)
Alprazolam is called to [facility] also had pharm correct requests being [facility] to [facility]

## 2010-10-26 ENCOUNTER — Encounter: Payer: Self-pay | Admitting: Internal Medicine

## 2010-11-19 ENCOUNTER — Other Ambulatory Visit: Payer: Self-pay | Admitting: *Deleted

## 2010-11-19 MED ORDER — ALPRAZOLAM 0.5 MG PO TABS: 0.5000 mg | ORAL_TABLET | Freq: Every evening | ORAL | Status: DC | PRN

## 2010-11-23 ENCOUNTER — Telehealth: Payer: Self-pay | Admitting: *Deleted

## 2010-11-23 NOTE — Telephone Encounter (Signed)
Pt was given a 6 month supply of Prozac at last visit. Mobic is a short term med so long term supply not indicated. Has not gotten refill of Lisinopril since EPIC started. I assume she is upset over her Xanax. Dr Eben Burow researched the med and included his findings in his note. He is tapering her off med while starting SSRI so long term refills not appropriate.   If she has concerns about her care, she needs to make an appt and discuss with Dr Eben Burow. We generally do not switch providers. I believe after doing the research that Dr Eben Burow did, a new provider might come to the same conclusion to taper med as Dr Eben Burow. Even if a new provider decided to cont Xanax, it would be chronic controlled substances and would be held to standards - only 3 month supply, UDS, contract, etc.     Lori Bradford - would you address name calling and inappropriate language with patient?

## 2010-11-23 NOTE — Telephone Encounter (Signed)
Pt has called 2 days in a row and left unkind messages, she states she "does not blame you girls but does blame the ---- ---- dr. She progresses to call dr garg names because he will not give her "at least 90 day supplies but she would like 5 or more refills" the message was retained on triage phone. She also states she wants a new pcp. Even though the message was not directed at myself or any other staff it was quite rude toward the md.

## 2010-11-24 ENCOUNTER — Telehealth: Payer: Self-pay | Admitting: *Deleted

## 2010-11-24 NOTE — Telephone Encounter (Signed)
I called patient and we discussed how she felt.  I followed Dr. Radene Gunning recommendations.  Pt. Is due for a f/u appointment in Aug.  She agreed we need to eliminate her concerns over her care.  She also agreed that name calling can not happen and she should stop. The issue the patient stressed is the refilling of her alprazolam, stating other doctors have always done this.  My response was this doctor does not feel that is in you best interest and for clarity his recommendations were reviewed by the medical director.

## 2011-01-03 ENCOUNTER — Encounter: Payer: Self-pay | Admitting: Internal Medicine

## 2011-01-03 ENCOUNTER — Ambulatory Visit (INDEPENDENT_AMBULATORY_CARE_PROVIDER_SITE_OTHER): Payer: Self-pay | Admitting: Internal Medicine

## 2011-01-03 VITALS — BP 157/65 | HR 75 | Temp 97.5°F | Ht 63.0 in | Wt 234.6 lb

## 2011-01-03 DIAGNOSIS — F341 Dysthymic disorder: Secondary | ICD-10-CM

## 2011-01-03 DIAGNOSIS — K921 Melena: Secondary | ICD-10-CM

## 2011-01-03 DIAGNOSIS — F419 Anxiety disorder, unspecified: Secondary | ICD-10-CM

## 2011-01-03 DIAGNOSIS — I10 Essential (primary) hypertension: Secondary | ICD-10-CM

## 2011-01-03 DIAGNOSIS — G4700 Insomnia, unspecified: Secondary | ICD-10-CM

## 2011-01-03 DIAGNOSIS — F411 Generalized anxiety disorder: Secondary | ICD-10-CM

## 2011-01-03 MED ORDER — FLUOXETINE HCL 20 MG PO TABS: 20.0000 mg | ORAL_TABLET | Freq: Every day | ORAL | Status: DC

## 2011-01-03 MED ORDER — ALPRAZOLAM 0.5 MG PO TABS: 0.5000 mg | ORAL_TABLET | Freq: Every evening | ORAL | Status: DC | PRN

## 2011-01-03 MED ORDER — LISINOPRIL-HYDROCHLOROTHIAZIDE 20-12.5 MG PO TABS: 1.0000 | ORAL_TABLET | Freq: Every day | ORAL | Status: DC

## 2011-01-03 NOTE — Patient Instructions (Signed)

## 2011-01-03 NOTE — Progress Notes (Signed)
  Subjective:    Patient ID: Lori Bradford, female    DOB: 05-Apr-1946, 65 y.o.   MRN: [id number]  HPI Lori Bradford of this 65 year old female with past medical history of anxiety depression, hypertension and obesity who comes in today for medication refill. Patient has had all her regular blood tests done in last visit 3 months ago. Patient is up-to-date on all her health maintenance issues. Patient does not want to get colonoscopy done at this point of time. Patient was given a prescription for Zostavax today. Her blood pressure was elevated at 157/65. Patient said that she has not started taking the new medication which was prescribed last month. I have asked her to start taking the medication and follow up with me in one month. No other complaints at this time the  Review of Systems  Constitutional: Negative for fever, activity change and appetite change.  HENT: Negative for sore throat.   Respiratory: Negative for cough and shortness of breath.   Cardiovascular: Negative for chest pain and leg swelling.  Gastrointestinal: Negative for nausea, abdominal pain, diarrhea, constipation and abdominal distention.  Genitourinary: Negative for frequency, hematuria and difficulty urinating.  Neurological: Negative for dizziness and headaches.  Psychiatric/Behavioral: Negative for suicidal ideas and behavioral problems.       Objective:   Physical Exam  Constitutional: She is oriented to person, place, and time. She appears well-developed and well-nourished.  HENT:  Head: Normocephalic and atraumatic.  Eyes: Conjunctivae and EOM are normal. Pupils are equal, round, and reactive to light. No scleral icterus.  Neck: Normal range of motion. Neck supple. No JVD present. No thyromegaly present.  Cardiovascular: Normal rate, regular rhythm, normal heart sounds and intact distal pulses.  Exam reveals no gallop and no friction rub.   No murmur heard. Pulmonary/Chest: Effort normal and breath sounds normal.  No respiratory distress. She has no wheezes. She has no rales.  Abdominal: Soft. Bowel sounds are normal. She exhibits no distension and no mass. There is no tenderness. There is no rebound and no guarding.  Musculoskeletal: Normal range of motion. She exhibits no edema and no tenderness.  Lymphadenopathy:    She has no cervical adenopathy.  Neurological: She is alert and oriented to person, place, and time.  Psychiatric: She has a normal mood and affect. Her behavior is normal.          Assessment & Plan:   No problem-specific assessment & plan notes found for this encounter.

## 2011-01-03 NOTE — Assessment & Plan Note (Signed)
Patient has not had colonoscopy in the past and states that she would only have one once she gets Medicare next year. Hemoglobin stable checked 3 months ago. No need to repeat as patient is asymptomatic at this time.

## 2011-01-03 NOTE — Assessment & Plan Note (Signed)
Hypertension today. Followup in one month after she starts taking her medications.

## 2011-01-03 NOTE — Assessment & Plan Note (Signed)
Patient complains of chronic insomnia. The Xanax given for 30 days last visit with no refills did not work well for her as she was unable to sleep for a significant period of time. I will give her a 90 day supply today and plan to restart Prozac and follow up in one month to go up on the dose.

## 2011-01-03 NOTE — Assessment & Plan Note (Signed)
Patient states that Prozac helped a lot but she still is unable to sleep at night. In view of her insomnia which is chronic I would give her 90 days of Xanax at this time. Patient was also requested to keep taking Prozac. She did not refill Prozac and have asked her to come back in one month when she restarts Prozac and at that time we would go up on the dosages.

## 2011-02-01 ENCOUNTER — Ambulatory Visit (INDEPENDENT_AMBULATORY_CARE_PROVIDER_SITE_OTHER): Payer: Self-pay | Admitting: Internal Medicine

## 2011-02-01 ENCOUNTER — Encounter: Payer: Self-pay | Admitting: Internal Medicine

## 2011-02-01 ENCOUNTER — Ambulatory Visit: Payer: Self-pay | Admitting: Internal Medicine

## 2011-02-01 VITALS — BP 123/71 | HR 66 | Temp 96.8°F | Ht 63.0 in | Wt 233.6 lb

## 2011-02-01 DIAGNOSIS — Z23 Encounter for immunization: Secondary | ICD-10-CM

## 2011-02-01 DIAGNOSIS — F419 Anxiety disorder, unspecified: Secondary | ICD-10-CM

## 2011-02-01 DIAGNOSIS — Z299 Encounter for prophylactic measures, unspecified: Secondary | ICD-10-CM

## 2011-02-01 DIAGNOSIS — I10 Essential (primary) hypertension: Secondary | ICD-10-CM

## 2011-02-01 DIAGNOSIS — F411 Generalized anxiety disorder: Secondary | ICD-10-CM

## 2011-02-01 DIAGNOSIS — F341 Dysthymic disorder: Secondary | ICD-10-CM

## 2011-02-01 MED ORDER — LISINOPRIL-HYDROCHLOROTHIAZIDE 20-12.5 MG PO TABS: 1.0000 | ORAL_TABLET | Freq: Every day | ORAL | Status: DC

## 2011-02-01 MED ORDER — MELOXICAM 7.5 MG PO TABS: 7.5000 mg | ORAL_TABLET | Freq: Three times a day (TID) | ORAL | Status: DC

## 2011-02-01 MED ORDER — FLUOXETINE HCL 20 MG PO TABS: 20.0000 mg | ORAL_TABLET | Freq: Every day | ORAL | Status: DC

## 2011-02-01 MED ORDER — ALPRAZOLAM 0.5 MG PO TABS: 0.5000 mg | ORAL_TABLET | Freq: Every evening | ORAL | Status: DC | PRN

## 2011-02-01 NOTE — Assessment & Plan Note (Signed)
Well-controlled on current medication regimen. We will continue the same regimen for now

## 2011-02-01 NOTE — Progress Notes (Signed)
  Subjective:    Patient ID: [name], female    DOB: [birth year], 65 y.o.   MRN: [id number]  HPI  Patient is 65 year old female with past medical history outlined below who comes to clinic for regular followup and like to have refills on her medications. She reports compliance with medicines as well as recommended diet and exercise. She denies recent significant hospitalizations, no episodes of chest pain or shortness of breath, no fevers or chills, no abdominal or urinary concerns.  Review of Systems Constitutional: Denies fever, chills, diaphoresis, appetite change and fatigue.  HEENT: Denies photophobia, eye pain, redness, hearing loss, ear pain, congestion, sore throat, rhinorrhea, sneezing, mouth sores, trouble swallowing, neck pain, neck stiffness and tinnitus.   Respiratory: Denies SOB, DOE, cough, chest tightness,  and wheezing.   Cardiovascular: Denies chest pain, palpitations and leg swelling.  Gastrointestinal: Denies nausea, vomiting, abdominal pain, diarrhea, constipation, blood in stool and abdominal distention.  Genitourinary: Denies dysuria, urgency, frequency, hematuria, flank pain and difficulty urinating.  Musculoskeletal: Denies myalgias, back pain, joint swelling, arthralgias and gait problem.  Skin: Denies pallor, rash and wound.  Neurological: Denies dizziness, seizures, syncope, weakness, light-headedness, numbness and headaches.  Hematological: Denies adenopathy. Easy bruising, personal or family bleeding history  Psychiatric/Behavioral: Denies suicidal ideation, mood changes, confusion, nervousness, sleep disturbance and agitation      Objective:   Physical Exam  Constitutional: Vital signs reviewed.  Patient is a well-developed and well-nourished in no acute distress and cooperative with exam. Alert and oriented x3.  Head: Normocephalic and atraumatic Ear: TM normal bilaterally Mouth: no erythema or exudates, MMM Eyes: PERRL, EOMI, conjunctivae normal, No  scleral icterus.  Neck: Supple, Trachea midline normal ROM, No JVD, mass, thyromegaly, or carotid bruit present.  Cardiovascular: RRR, S1 normal, S2 normal, no MRG, pulses symmetric and intact bilaterally Pulmonary/Chest: CTAB, no wheezes, rales, or rhonchi Abdominal: Soft. Non-tender, non-distended, bowel sounds are normal, no masses, organomegaly, or guarding present.  GU: no CVA tenderness Musculoskeletal: No joint deformities, erythema, or stiffness, ROM full and no nontender Hematology: no cervical, inginal, or axillary adenopathy.  Neurological: A&O x3, Strenght is normal and symmetric bilaterally, cranial nerve II-XII are grossly intact, no focal motor deficit, sensory intact to light touch bilaterally.  Skin: Warm, dry and intact. No rash, cyanosis, or clubbing.  Psychiatric: Normal mood and affect. speech and behavior is normal. Judgment and thought content normal. Cognition and memory are normal.         Assessment & Plan:

## 2011-02-01 NOTE — Assessment & Plan Note (Signed)
Well-controlled on current medication regimen. We will continue the same. Would recommend checking an excellent panel on her next visit

## 2011-04-12 ENCOUNTER — Encounter: Payer: Self-pay | Admitting: Internal Medicine

## 2011-05-13 ENCOUNTER — Other Ambulatory Visit: Payer: Self-pay | Admitting: *Deleted

## 2011-05-13 DIAGNOSIS — F419 Anxiety disorder, unspecified: Secondary | ICD-10-CM

## 2011-05-16 MED ORDER — ALPRAZOLAM 0.5 MG PO TABS: 0.5000 mg | ORAL_TABLET | Freq: Every evening | ORAL | Status: DC | PRN

## 2011-05-16 NOTE — Telephone Encounter (Signed)
Spoke with Dr. Eben Burow refill for the Ambien was called to the [facility] in [facility].  Angelina Ok, RN 05/16/2011 12:33 PM

## 2011-06-14 ENCOUNTER — Telehealth: Payer: Self-pay | Admitting: *Deleted

## 2011-06-14 NOTE — Telephone Encounter (Signed)
Pharmacy informed.

## 2011-06-14 NOTE — Telephone Encounter (Signed)
It should only be for BID. Fine to give 5 refills. Please thank them for calling.

## 2011-06-14 NOTE — Telephone Encounter (Signed)
Pt brought in Rx for meloxicam 7.5 mg TID to pharmacy.  Pharmacy is questioning the dose of tid as it exceeds the recommended dose of 14 mg a day.  Rx written by Dr Aldine Contes on 8/28.  Just being filled now. Please advise

## 2011-08-19 ENCOUNTER — Other Ambulatory Visit: Payer: Self-pay | Admitting: Family Medicine

## 2011-08-19 ENCOUNTER — Encounter: Payer: Self-pay | Admitting: Family Medicine

## 2011-08-19 ENCOUNTER — Ambulatory Visit
Admission: RE | Admit: 2011-08-19 | Discharge: 2011-08-19 | Disposition: A | Discharge status: Home / Self Care | Payer: Medicare Other | Source: Ambulatory Visit | Attending: Family Medicine | Admitting: Family Medicine

## 2011-08-19 ENCOUNTER — Ambulatory Visit (INDEPENDENT_AMBULATORY_CARE_PROVIDER_SITE_OTHER): Payer: Medicare Other | Admitting: Family Medicine

## 2011-08-19 VITALS — BP 136/81

## 2011-08-19 DIAGNOSIS — M25562 Pain in left knee: Secondary | ICD-10-CM

## 2011-08-19 DIAGNOSIS — M25569 Pain in unspecified knee: Secondary | ICD-10-CM

## 2011-08-19 MED ORDER — TRAMADOL HCL 50 MG PO TABS: 50.0000 mg | ORAL_TABLET | Freq: Four times a day (QID) | ORAL | Status: AC | PRN

## 2011-08-19 NOTE — Patient Instructions (Signed)
Thank you for coming in today. Please come back in 2-4 weeks.  Use the tramadol as needed twice a day.  We will get xrays.  We will call about physical therapy.

## 2011-08-19 NOTE — Assessment & Plan Note (Signed)
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  However I suspect general knee arthritis is advance do to loss of extension bilaterally. Plan to assess arthritis with standing knee films and patellofemoral arthritis with sunrise views.  Additionally will prescribe tramadol as needed for pain and see patient back in clinic in 2-3 weeks.  Advised against increasing doses of long-term NSAIDs due to the possibility of renal insufficiency with hypertension and NSAIDs.  Also asked patient to followup with her primary care doctor for a basic metabolic panel and it's been one year since her last assessment of creatinine and she is on daily meloxicam.

## 2011-08-19 NOTE — Progress Notes (Signed)
Lori Bradford is a 66 y.o. female who presents to [facility] today for Left anterior knee pain. Pain is present for over one year. Patient notes pain upon standing from a seated position and with walking. Occasionally she will have pain at rest.  She denies any history of trauma to her left knee. Additionally she denies any surgical history to her left knee. She notes her right knee had a ligamentous injury and had a arthroscopic repair in 2000.  She takes meloxicam daily for shoulder arthritis and knee arthritis. She denies any significant knee swelling.    PMH reviewed. Significant for obesity, and hypertension ROS as above otherwise neg Medications reviewed. Significant for meloxicam and lisinopril  Exam:  BP 136/81 Gen: Well NAD, Obese MSK: Left knee with no effusion. No erythema. Range of motion Right knee diminished 170 extension and90 flexion Left knee extension to 170 flexion to 110 With range of motion of the left knee crepitations are palpated over the patella.   Pain with patellar grind test. Lachman's, valgus and varus stress and McMurray's are negative.  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  However I suspect general knee arthritis is advance do to loss of extension bilaterally. Plan to assess arthritis with standing knee films and patellofemoral arthritis with sunrise views.  Additionally will prescribe tramadol as needed for pain and see patient back in clinic in 2-3 weeks.  Advised against increasing doses of long-term NSAIDs due to the possibility of renal insufficiency with hypertension and NSAIDs.  Also asked patient to followup with her primary care doctor for a basic metabolic panel and it's been one year since her last assessment of creatinine and she is on daily meloxicam.

## 2011-08-23 ENCOUNTER — Encounter: Payer: Self-pay | Admitting: Family Medicine

## 2011-09-06 ENCOUNTER — Encounter: Payer: Self-pay | Admitting: Internal Medicine

## 2011-09-06 ENCOUNTER — Ambulatory Visit (INDEPENDENT_AMBULATORY_CARE_PROVIDER_SITE_OTHER): Payer: Medicare Other | Admitting: Internal Medicine

## 2011-09-06 VITALS — BP 126/65 | HR 79 | Temp 97.4°F | Ht 63.0 in | Wt 235.9 lb

## 2011-09-06 DIAGNOSIS — F341 Dysthymic disorder: Secondary | ICD-10-CM

## 2011-09-06 DIAGNOSIS — M25569 Pain in unspecified knee: Secondary | ICD-10-CM

## 2011-09-06 DIAGNOSIS — Z1231 Encounter for screening mammogram for malignant neoplasm of breast: Secondary | ICD-10-CM

## 2011-09-06 DIAGNOSIS — Z1211 Encounter for screening for malignant neoplasm of colon: Secondary | ICD-10-CM

## 2011-09-06 DIAGNOSIS — Z0000 Encounter for general adult medical examination without abnormal findings: Secondary | ICD-10-CM

## 2011-09-06 DIAGNOSIS — M25562 Pain in left knee: Secondary | ICD-10-CM

## 2011-09-06 DIAGNOSIS — I10 Essential (primary) hypertension: Secondary | ICD-10-CM

## 2011-09-06 DIAGNOSIS — Z1239 Encounter for other screening for malignant neoplasm of breast: Secondary | ICD-10-CM

## 2011-09-06 LAB — COMPREHENSIVE METABOLIC PANEL
AST: 20 U/L (ref 0–37)
Alkaline Phosphatase: 78 U/L (ref 39–117)
BUN: 8 mg/dL (ref 6–23)
Creat: 0.63 mg/dL (ref 0.50–1.10)
Total Bilirubin: 0.5 mg/dL (ref 0.3–1.2)

## 2011-09-06 MED ORDER — LISINOPRIL-HYDROCHLOROTHIAZIDE 20-12.5 MG PO TABS: 1.0000 | ORAL_TABLET | Freq: Every day | ORAL | Status: DC

## 2011-09-06 MED ORDER — ZOSTER VACCINE LIVE 19400 UNT/0.65ML ~~LOC~~ SOLR: 0.6500 mL | Freq: Once | SUBCUTANEOUS | Status: AC

## 2011-09-06 NOTE — Assessment & Plan Note (Signed)
Pertinent Data: BP Readings from Last 3 Encounters:  09/06/11 126/65  08/19/11 136/81  02/01/11 123/71    Basic Metabolic Panel:    Component Value Date/Time   NA 136 06/08/2010 0429   K 3.9 06/08/2010 0429   CL 99 06/08/2010 0429   CO2 25 06/08/2010 0429   BUN 9 06/08/2010 0429   CREATININE 0.61 06/08/2010 0429   GLUCOSE 102* 06/08/2010 0429   CALCIUM 10.0 06/08/2010 0429    Assessment: Status: - Patient is compliant all of the time with prescribed medications.   Disease Control: controlled  Progress toward goals: at goal  Barriers to meeting goals: no barriers identified   Plan:  continue current medications  Check BMET today.

## 2011-09-06 NOTE — Patient Instructions (Signed)
   Please follow-up at the clinic in 3-6 months, at which time we will reevaluate your blood pressure and preventative care matters - OR, please follow-up in the clinic sooner if needed.  There have been changes in your medications:  STOP your meloxicam   If you have been started on new medication(s), and you develop symptoms concerning for allergic reaction, including, but not limited to, throat closing, tongue swelling, rash, please stop the medication immediately and call the clinic at [phone], and go to the ER.  If symptoms worsen, or new symptoms arise, please call the clinic or go to the ER.  Please bring all of your medications in a bag to your next visit.

## 2011-09-06 NOTE — Assessment & Plan Note (Signed)
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 She is being evaluated and treated by Dr. Jennette Kettle. Tramadol has been much more effective than Meloxicam (which does not help with her pain).  Plan:      DC Meloxicam  Continue Tramadol as prescribed.  Check BMET.
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
## 2011-09-06 NOTE — Assessment & Plan Note (Signed)
Assessment:  Pt is currently taking Xanax and is controlled on this medication - states she stopped prozac recently because made her feel like a zombie. Denies SI/HI. Other potential contributing factors towards her symptoms include poor relationship with her daughter, and resultant inability to have a close relationship with her granddaughter.  Plan:  Continue current regimen.

## 2011-09-06 NOTE — Assessment & Plan Note (Addendum)
Health Maintenance  Topic Date Due  . Colonoscopy  06/22/1996  . Zostavax  06/22/2006  . Mammogram  01/09/2011  . Pneumococcal Polysaccharide Vaccine Age 65 And Over  06/23/2011  . Influenza Vaccine  03/06/2012  . Tetanus/tdap  06/07/2020    Assessment:  Due for several preventative care measures.  Refuses colonoscopy  Wants new Rx for zostavax.  Plan:  Zostavax Rx  Stool cards  Mammogram referral --> will forward results to Dr. Eben Burow

## 2011-09-09 ENCOUNTER — Ambulatory Visit (INDEPENDENT_AMBULATORY_CARE_PROVIDER_SITE_OTHER): Payer: Medicare Other | Admitting: Family Medicine

## 2011-09-09 ENCOUNTER — Encounter: Payer: Self-pay | Admitting: Family Medicine

## 2011-09-09 VITALS — BP 115/73 | HR 71

## 2011-09-09 DIAGNOSIS — M179 Osteoarthritis of knee, unspecified: Secondary | ICD-10-CM | POA: Insufficient documentation

## 2011-09-09 DIAGNOSIS — M1710 Unilateral primary osteoarthritis, unspecified knee: Secondary | ICD-10-CM
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
## 2011-09-09 NOTE — Patient Instructions (Signed)
You have been scheduled for an appointment with Dr. Magnus Ivan at [facility] ortho on September 19, 2011 at 10:45am.   Their office is located at [address] [place] [address] and phone number is [phone].

## 2011-09-27 ENCOUNTER — Other Ambulatory Visit (HOSPITAL_COMMUNITY): Payer: Self-pay | Admitting: Orthopaedic Surgery

## 2011-09-28 ENCOUNTER — Other Ambulatory Visit (HOSPITAL_COMMUNITY): Payer: Self-pay | Admitting: Orthopaedic Surgery

## 2011-09-30 ENCOUNTER — Ambulatory Visit (HOSPITAL_COMMUNITY)
Admission: RE | Admit: 2011-09-30 | Discharge: 2011-09-30 | Disposition: A | Discharge status: Home / Self Care | Payer: Medicare Other | Source: Ambulatory Visit | Attending: Internal Medicine | Admitting: Internal Medicine

## 2011-09-30 DIAGNOSIS — Z1231 Encounter for screening mammogram for malignant neoplasm of breast: Secondary | ICD-10-CM | POA: Insufficient documentation

## 2011-10-05 ENCOUNTER — Encounter (HOSPITAL_COMMUNITY): Payer: Self-pay | Admitting: Pharmacy Technician

## 2011-10-10 ENCOUNTER — Encounter (HOSPITAL_COMMUNITY)
Admission: RE | Admit: 2011-10-10 | Discharge: 2011-10-10 | Disposition: A | Discharge status: Home / Self Care | Payer: Medicare Other | Source: Ambulatory Visit | Attending: Orthopaedic Surgery | Admitting: Orthopaedic Surgery

## 2011-10-10 ENCOUNTER — Encounter (HOSPITAL_COMMUNITY): Payer: Self-pay

## 2011-10-10 ENCOUNTER — Ambulatory Visit (HOSPITAL_COMMUNITY)
Admission: RE | Admit: 2011-10-10 | Discharge: 2011-10-10 | Disposition: A | Discharge status: Home / Self Care | Payer: Medicare Other | Source: Ambulatory Visit | Attending: Orthopaedic Surgery | Admitting: Orthopaedic Surgery

## 2011-10-10 DIAGNOSIS — Z01810 Encounter for preprocedural cardiovascular examination: Secondary | ICD-10-CM | POA: Insufficient documentation

## 2011-10-10 DIAGNOSIS — X58XXXA Exposure to other specified factors, initial encounter: Secondary | ICD-10-CM | POA: Insufficient documentation

## 2011-10-10 DIAGNOSIS — Z01812 Encounter for preprocedural laboratory examination: Secondary | ICD-10-CM | POA: Insufficient documentation

## 2011-10-10 DIAGNOSIS — S32009A Unspecified fracture of unspecified lumbar vertebra, initial encounter for closed fracture: Secondary | ICD-10-CM | POA: Insufficient documentation

## 2011-10-10 DIAGNOSIS — I10 Essential (primary) hypertension: Secondary | ICD-10-CM | POA: Insufficient documentation

## 2011-10-10 DIAGNOSIS — M1710 Unilateral primary osteoarthritis, unspecified knee: Secondary | ICD-10-CM | POA: Insufficient documentation

## 2011-10-10 HISTORY — DX: Pneumonia, unspecified organism: J18.9

## 2011-10-10 HISTORY — DX: Cardiac arrhythmia, unspecified: I49.9

## 2011-10-10 HISTORY — DX: Personal history of other diseases of the digestive system: Z87.19

## 2011-10-10 LAB — CBC
HCT: 37.7 % (ref 36.0–46.0)
MCHC: 33.2 g/dL (ref 30.0–36.0)
MCV: 90.6 fL (ref 78.0–100.0)
RDW: 13.4 % (ref 11.5–15.5)

## 2011-10-10 LAB — PROTIME-INR: Prothrombin Time: 13.6 seconds (ref 11.6–15.2)

## 2011-10-10 LAB — URINALYSIS, ROUTINE W REFLEX MICROSCOPIC
Bilirubin Urine: NEGATIVE
Hgb urine dipstick: NEGATIVE
Specific Gravity, Urine: 1.005 (ref 1.005–1.030)
Urobilinogen, UA: 0.2 mg/dL (ref 0.0–1.0)

## 2011-10-10 LAB — BASIC METABOLIC PANEL
BUN: 7 mg/dL (ref 6–23)
CO2: 27 mEq/L (ref 19–32)
Chloride: 99 mEq/L (ref 96–112)
Creatinine, Ser: 0.63 mg/dL (ref 0.50–1.10)

## 2011-10-10 LAB — SURGICAL PCR SCREEN: Staphylococcus aureus: NEGATIVE

## 2011-10-10 NOTE — Patient Instructions (Signed)
20 Lori Bradford  10/10/2011   Your procedure is scheduled on:  10/14/11 1234pm-300pm  Report to [facility] at 1000 AM.  Call this number if you have problems the morning of surgery: [phone]   Remember:   Do not eat food:After Midnight.  May have clear liquids:until Midnight .   Take these medicines the morning of surgery with A SIP OF WATER:   Do not wear jewelry, make-up or nail polish.  Do not wear lotions, powders, or perfumes.   Do not shave 48 hours prior to surgery.  Do not bring valuables to the hospital.  Contacts, dentures or bridgework may not be worn into surgery.  Leave suitcase in the car. After surgery it may be brought to your room.  For patients admitted to the hospital, checkout time is 11:00 AM the day of discharge.    Special Instructions: CHG Shower Use Special Wash: 1/2 bottle night before surgery and 1/2 bottle morning of surgery. shower chin to toes with CHG.  Wash face and private parts with regular soap.    Please read over the following fact sheets that you were given: MRSA Information, Blood Transfusion Fact Sheet, coughing and deep breathing exercises, leg exercises

## 2011-10-14 ENCOUNTER — Encounter (HOSPITAL_COMMUNITY): Payer: Self-pay | Admitting: Anesthesiology

## 2011-10-14 ENCOUNTER — Encounter (HOSPITAL_COMMUNITY)
Admission: RE | Disposition: A | Discharge status: Home Health | Payer: Self-pay | Source: Ambulatory Visit | Attending: Orthopaedic Surgery

## 2011-10-14 ENCOUNTER — Inpatient Hospital Stay (HOSPITAL_COMMUNITY)
Admission: RE | Admit: 2011-10-14 | Discharge: 2011-10-16 | DRG: 470 | Disposition: A | Discharge status: Home Health | Payer: Medicare Other | Source: Ambulatory Visit | Attending: Orthopaedic Surgery | Admitting: Orthopaedic Surgery

## 2011-10-14 ENCOUNTER — Encounter (HOSPITAL_COMMUNITY): Payer: Self-pay | Admitting: *Deleted

## 2011-10-14 ENCOUNTER — Ambulatory Visit (HOSPITAL_COMMUNITY): Payer: Medicare Other | Admitting: Anesthesiology

## 2011-10-14 ENCOUNTER — Ambulatory Visit (HOSPITAL_COMMUNITY): Payer: Medicare Other

## 2011-10-14 DIAGNOSIS — D649 Anemia, unspecified: Secondary | ICD-10-CM | POA: Diagnosis not present

## 2011-10-14 DIAGNOSIS — Z6841 Body Mass Index (BMI) 40.0 and over, adult: Secondary | ICD-10-CM

## 2011-10-14 DIAGNOSIS — M1712 Unilateral primary osteoarthritis, left knee: Secondary | ICD-10-CM

## 2011-10-14 DIAGNOSIS — I10 Essential (primary) hypertension: Secondary | ICD-10-CM | POA: Diagnosis present

## 2011-10-14 DIAGNOSIS — M1710 Unilateral primary osteoarthritis, unspecified knee: Principal | ICD-10-CM | POA: Diagnosis present

## 2011-10-14 HISTORY — PX: KNEE ARTHROPLASTY: SHX992

## 2011-10-14 LAB — TYPE AND SCREEN
ABO/RH(D): A POS
Antibody Screen: NEGATIVE

## 2011-10-14 SURGERY — ARTHROPLASTY, KNEE, TOTAL, USING IMAGELESS COMPUTER-ASSISTED NAVIGATION
Anesthesia: General | Site: Knee | Laterality: Left | Wound class: Clean

## 2011-10-14 MED ORDER — MORPHINE SULFATE (PF) 1 MG/ML IV SOLN
INTRAVENOUS | Status: AC
  Administered 2011-10-15: 1.98 mg via INTRAVENOUS
  Filled 2011-10-14: qty 25

## 2011-10-14 MED ORDER — FENTANYL CITRATE 0.05 MG/ML IJ SOLN
INTRAMUSCULAR | Status: DC | PRN
  Administered 2011-10-14 (×7): 50 ug via INTRAVENOUS
  Administered 2011-10-14: 100 ug via INTRAVENOUS
  Administered 2011-10-14: 50 ug via INTRAVENOUS

## 2011-10-14 MED ORDER — ZOLPIDEM TARTRATE 5 MG PO TABS: 5.0000 mg | ORAL_TABLET | Freq: Every evening | ORAL | Status: DC | PRN

## 2011-10-14 MED ORDER — ALUM & MAG HYDROXIDE-SIMETH 200-200-20 MG/5ML PO SUSP: 30.0000 mL | ORAL | Status: DC | PRN

## 2011-10-14 MED ORDER — HYDROMORPHONE HCL PF 1 MG/ML IJ SOLN: 0.2500 mg | INTRAMUSCULAR | Status: DC | PRN

## 2011-10-14 MED ORDER — MIDAZOLAM HCL 5 MG/5ML IJ SOLN
INTRAMUSCULAR | Status: DC | PRN
  Administered 2011-10-14: 2 mg via INTRAVENOUS

## 2011-10-14 MED ORDER — NALOXONE HCL 0.4 MG/ML IJ SOLN: 0.4000 mg | INTRAMUSCULAR | Status: DC | PRN

## 2011-10-14 MED ORDER — ONDANSETRON HCL 4 MG/2ML IJ SOLN
INTRAMUSCULAR | Status: DC | PRN
  Administered 2011-10-14: 4 mg via INTRAVENOUS

## 2011-10-14 MED ORDER — NEOSTIGMINE METHYLSULFATE 1 MG/ML IJ SOLN
INTRAMUSCULAR | Status: DC | PRN
  Administered 2011-10-14: 4 mg via INTRAVENOUS

## 2011-10-14 MED ORDER — LISINOPRIL 20 MG PO TABS
20.0000 mg | ORAL_TABLET | Freq: Every day | ORAL | Status: DC
  Administered 2011-10-14: 20 mg via ORAL
  Filled 2011-10-14 (×3): qty 1

## 2011-10-14 MED ORDER — HYDROCODONE-ACETAMINOPHEN 5-325 MG PO TABS: 1.0000 | ORAL_TABLET | ORAL | Status: DC | PRN

## 2011-10-14 MED ORDER — ACETAMINOPHEN 650 MG RE SUPP: 650.0000 mg | Freq: Four times a day (QID) | RECTAL | Status: DC | PRN

## 2011-10-14 MED ORDER — METHOCARBAMOL 100 MG/ML IJ SOLN
500.0000 mg | Freq: Four times a day (QID) | INTRAVENOUS | Status: DC | PRN
  Administered 2011-10-14: 500 mg via INTRAVENOUS
  Filled 2011-10-14 (×2): qty 5

## 2011-10-14 MED ORDER — PROPOFOL 10 MG/ML IV BOLUS
INTRAVENOUS | Status: DC | PRN
  Administered 2011-10-14: 150 mg via INTRAVENOUS

## 2011-10-14 MED ORDER — ACETAMINOPHEN 325 MG PO TABS: 650.0000 mg | ORAL_TABLET | Freq: Four times a day (QID) | ORAL | Status: DC | PRN

## 2011-10-14 MED ORDER — OXYCODONE HCL 5 MG PO TABS: 5.0000 mg | ORAL_TABLET | ORAL | Status: DC | PRN

## 2011-10-14 MED ORDER — SODIUM CHLORIDE 0.9 % IJ SOLN: 9.0000 mL | INTRAMUSCULAR | Status: DC | PRN

## 2011-10-14 MED ORDER — DIPHENHYDRAMINE HCL 12.5 MG/5ML PO ELIX: 12.5000 mg | ORAL_SOLUTION | ORAL | Status: DC | PRN

## 2011-10-14 MED ORDER — LACTATED RINGERS IV SOLN
INTRAVENOUS | Status: DC
  Administered 2011-10-14 (×2): via INTRAVENOUS
  Administered 2011-10-14: 1000 mL via INTRAVENOUS

## 2011-10-14 MED ORDER — SUCCINYLCHOLINE CHLORIDE 20 MG/ML IJ SOLN
INTRAMUSCULAR | Status: DC | PRN
  Administered 2011-10-14: 100 mg via INTRAVENOUS

## 2011-10-14 MED ORDER — DIPHENHYDRAMINE HCL 50 MG/ML IJ SOLN: 12.5000 mg | Freq: Four times a day (QID) | INTRAMUSCULAR | Status: DC | PRN

## 2011-10-14 MED ORDER — METHOCARBAMOL 500 MG PO TABS: 500.0000 mg | ORAL_TABLET | Freq: Four times a day (QID) | ORAL | Status: DC | PRN

## 2011-10-14 MED ORDER — CLINDAMYCIN PHOSPHATE 600 MG/50ML IV SOLN
INTRAVENOUS | Status: AC
  Filled 2011-10-14: qty 50

## 2011-10-14 MED ORDER — MORPHINE SULFATE (PF) 1 MG/ML IV SOLN
INTRAVENOUS | Status: DC
  Administered 2011-10-14: 15:00:00 via INTRAVENOUS
  Administered 2011-10-14: 1.5 mg via INTRAVENOUS
  Administered 2011-10-14: 7.5 mg via INTRAVENOUS
  Administered 2011-10-15: 6 mg via INTRAVENOUS
  Administered 2011-10-15: 06:00:00 via INTRAVENOUS
  Administered 2011-10-15: 6 mg via INTRAVENOUS
  Administered 2011-10-15: 3 mg via INTRAVENOUS
  Administered 2011-10-15: 4.5 mg via INTRAVENOUS
  Administered 2011-10-15: 3.5 mg via INTRAVENOUS
  Administered 2011-10-16: 1.5 mg via INTRAVENOUS
  Administered 2011-10-16: 3 mg via INTRAVENOUS
  Filled 2011-10-14: qty 25

## 2011-10-14 MED ORDER — METOCLOPRAMIDE HCL 10 MG PO TABS: 5.0000 mg | ORAL_TABLET | Freq: Three times a day (TID) | ORAL | Status: DC | PRN

## 2011-10-14 MED ORDER — MORPHINE SULFATE 2 MG/ML IJ SOLN: 1.0000 mg | INTRAMUSCULAR | Status: DC | PRN

## 2011-10-14 MED ORDER — GLYCOPYRROLATE 0.2 MG/ML IJ SOLN
INTRAMUSCULAR | Status: DC | PRN
  Administered 2011-10-14: .5 mg via INTRAVENOUS

## 2011-10-14 MED ORDER — ACETAMINOPHEN 10 MG/ML IV SOLN
INTRAVENOUS | Status: AC
  Filled 2011-10-14: qty 100

## 2011-10-14 MED ORDER — DEXAMETHASONE SODIUM PHOSPHATE 10 MG/ML IJ SOLN
INTRAMUSCULAR | Status: DC | PRN
  Administered 2011-10-14: 10 mg via INTRAVENOUS

## 2011-10-14 MED ORDER — LISINOPRIL-HYDROCHLOROTHIAZIDE 20-12.5 MG PO TABS: 1.0000 | ORAL_TABLET | Freq: Every day | ORAL | Status: DC

## 2011-10-14 MED ORDER — ONDANSETRON HCL 4 MG/2ML IJ SOLN
4.0000 mg | Freq: Four times a day (QID) | INTRAMUSCULAR | Status: DC | PRN
  Filled 2011-10-14: qty 2

## 2011-10-14 MED ORDER — SODIUM CHLORIDE 0.9 % IV SOLN
INTRAVENOUS | Status: DC
  Administered 2011-10-14 – 2011-10-15 (×2): via INTRAVENOUS

## 2011-10-14 MED ORDER — HYDROCHLOROTHIAZIDE 12.5 MG PO CAPS
12.5000 mg | ORAL_CAPSULE | Freq: Every day | ORAL | Status: DC
  Administered 2011-10-14: 12.5 mg via ORAL
  Filled 2011-10-14 (×3): qty 1

## 2011-10-14 MED ORDER — SODIUM CHLORIDE 0.9 % IR SOLN
Status: DC | PRN
  Administered 2011-10-14: 1

## 2011-10-14 MED ORDER — DIPHENHYDRAMINE HCL 12.5 MG/5ML PO ELIX
12.5000 mg | ORAL_SOLUTION | Freq: Four times a day (QID) | ORAL | Status: DC | PRN
  Filled 2011-10-14: qty 5

## 2011-10-14 MED ORDER — ONDANSETRON HCL 4 MG/2ML IJ SOLN: 4.0000 mg | Freq: Four times a day (QID) | INTRAMUSCULAR | Status: DC | PRN

## 2011-10-14 MED ORDER — MENTHOL 3 MG MT LOZG: 1.0000 | LOZENGE | OROMUCOSAL | Status: DC | PRN

## 2011-10-14 MED ORDER — ROCURONIUM BROMIDE 100 MG/10ML IV SOLN
INTRAVENOUS | Status: DC | PRN
  Administered 2011-10-14: 20 mg via INTRAVENOUS

## 2011-10-14 MED ORDER — CLINDAMYCIN PHOSPHATE 600 MG/50ML IV SOLN
600.0000 mg | INTRAVENOUS | Status: AC
  Administered 2011-10-14: 600 mg via INTRAVENOUS

## 2011-10-14 MED ORDER — METOCLOPRAMIDE HCL 5 MG/ML IJ SOLN: 5.0000 mg | Freq: Three times a day (TID) | INTRAMUSCULAR | Status: DC | PRN

## 2011-10-14 MED ORDER — RIVAROXABAN 10 MG PO TABS
10.0000 mg | ORAL_TABLET | Freq: Every day | ORAL | Status: DC
  Administered 2011-10-15 – 2011-10-16 (×2): 10 mg via ORAL
  Filled 2011-10-14 (×3): qty 1

## 2011-10-14 MED ORDER — ACETAMINOPHEN 10 MG/ML IV SOLN
INTRAVENOUS | Status: DC | PRN
  Administered 2011-10-14: 1000 mg via INTRAVENOUS

## 2011-10-14 MED ORDER — CLINDAMYCIN PHOSPHATE 600 MG/50ML IV SOLN
600.0000 mg | Freq: Four times a day (QID) | INTRAVENOUS | Status: AC
  Administered 2011-10-14 – 2011-10-15 (×3): 600 mg via INTRAVENOUS
  Filled 2011-10-14 (×3): qty 50

## 2011-10-14 MED ORDER — PHENOL 1.4 % MT LIQD: 1.0000 | OROMUCOSAL | Status: DC | PRN

## 2011-10-14 MED ORDER — ALPRAZOLAM 0.5 MG PO TABS: 0.5000 mg | ORAL_TABLET | Freq: Every evening | ORAL | Status: DC | PRN

## 2011-10-14 MED ORDER — ONDANSETRON HCL 4 MG PO TABS: 4.0000 mg | ORAL_TABLET | Freq: Four times a day (QID) | ORAL | Status: DC | PRN

## 2011-10-14 MED ORDER — DOCUSATE SODIUM 100 MG PO CAPS
100.0000 mg | ORAL_CAPSULE | Freq: Two times a day (BID) | ORAL | Status: DC
  Administered 2011-10-14 – 2011-10-16 (×4): 100 mg via ORAL

## 2011-10-14 SURGICAL SUPPLY — 60 items
BAG SPEC THK2 15X12 ZIP CLS (MISCELLANEOUS) ×1
BAG ZIPLOCK 12X15 (MISCELLANEOUS) ×2 IMPLANT
BANDAGE ELASTIC 6 VELCRO ST LF (GAUZE/BANDAGES/DRESSINGS) ×2 IMPLANT
BANDAGE ESMARK 6X9 LF (GAUZE/BANDAGES/DRESSINGS) ×1 IMPLANT
BLADE SAG 18X100X1.27 (BLADE) ×2 IMPLANT
BLADE SAW SGTL 13.0X1.19X90.0M (BLADE) ×2 IMPLANT
BLADE SURG SZ11 CARB STEEL (BLADE) ×2 IMPLANT
BNDG CMPR 9X6 STRL LF SNTH (GAUZE/BANDAGES/DRESSINGS) ×1
BNDG COHESIVE 6X5 TAN STRL LF (GAUZE/BANDAGES/DRESSINGS) IMPLANT
BNDG ESMARK 6X9 LF (GAUZE/BANDAGES/DRESSINGS) ×2
BOWL SMART MIX CTS (DISPOSABLE) ×2 IMPLANT
CEMENT HV SMART SET (Cement) ×4 IMPLANT
CLOTH BEACON ORANGE TIMEOUT ST (SAFETY) ×2 IMPLANT
COVER SURGICAL LIGHT HANDLE (MISCELLANEOUS) ×2 IMPLANT
CUFF TOURN SGL QUICK 34 (TOURNIQUET CUFF) ×2
CUFF TRNQT CYL 34X4X40X1 (TOURNIQUET CUFF) ×1 IMPLANT
DRAPE EXTREMITY T 121X128X90 (DRAPE) ×2 IMPLANT
DRAPE LG THREE QUARTER DISP (DRAPES) ×2 IMPLANT
DRAPE POUCH INSTRU U-SHP 10X18 (DRAPES) ×2 IMPLANT
DRAPE U-SHAPE 47X51 STRL (DRAPES) ×2 IMPLANT
DRSG ADAPTIC 3X8 NADH LF (GAUZE/BANDAGES/DRESSINGS) ×2 IMPLANT
DRSG PAD ABDOMINAL 8X10 ST (GAUZE/BANDAGES/DRESSINGS) ×2 IMPLANT
DURAPREP 26ML APPLICATOR (WOUND CARE) ×2 IMPLANT
ELECT REM PT RETURN 9FT ADLT (ELECTROSURGICAL) ×2
ELECTRODE REM PT RTRN 9FT ADLT (ELECTROSURGICAL) ×1 IMPLANT
EVACUATOR 1/8 PVC DRAIN (DRAIN) ×2 IMPLANT
FACESHIELD LNG OPTICON STERILE (SAFETY) ×8 IMPLANT
GAUZE XEROFORM 1X8 LF (GAUZE/BANDAGES/DRESSINGS) ×1 IMPLANT
GAUZE XEROFORM 5X9 LF (GAUZE/BANDAGES/DRESSINGS) ×1 IMPLANT
GLOVE BIO SURGEON STRL SZ7.5 (GLOVE) ×2 IMPLANT
GOWN STRL REIN XL XLG (GOWN DISPOSABLE) ×2 IMPLANT
HANDPIECE INTERPULSE COAX TIP (DISPOSABLE) ×2
IMMOBILIZER KNEE 20 (SOFTGOODS) ×2
IMMOBILIZER KNEE 20 THIGH 36 (SOFTGOODS) IMPLANT
KIT BASIN OR (CUSTOM PROCEDURE TRAY) ×2 IMPLANT
NS IRRIG 1000ML POUR BTL (IV SOLUTION) ×2 IMPLANT
PACK TOTAL JOINT (CUSTOM PROCEDURE TRAY) ×2 IMPLANT
PADDING CAST COTTON 6X4 STRL (CAST SUPPLIES) ×2 IMPLANT
PIN SCHANZ 4MM 130MM (PIN) ×8 IMPLANT
POSITIONER SURGICAL ARM (MISCELLANEOUS) ×2 IMPLANT
SET HNDPC FAN SPRY TIP SCT (DISPOSABLE) ×1 IMPLANT
SET PAD KNEE POSITIONER (MISCELLANEOUS) ×2 IMPLANT
SPONGE GAUZE 4X4 12PLY (GAUZE/BANDAGES/DRESSINGS) ×2 IMPLANT
SPONGE LAP 18X18 X RAY DECT (DISPOSABLE) ×1 IMPLANT
STAPLER VISISTAT 35W (STAPLE) ×2 IMPLANT
STRIP CLOSURE SKIN 1/2X4 (GAUZE/BANDAGES/DRESSINGS) ×2 IMPLANT
SUCTION FRAZIER 12FR DISP (SUCTIONS) ×2 IMPLANT
SUT ETHILON 3 0 PS 1 (SUTURE) ×2 IMPLANT
SUT VIC AB 0 CT1 27 (SUTURE)
SUT VIC AB 0 CT1 27XBRD ANTBC (SUTURE) ×2 IMPLANT
SUT VIC AB 1 CT1 27 (SUTURE) ×2
SUT VIC AB 1 CT1 27XBRD ANTBC (SUTURE) ×1 IMPLANT
SUT VIC AB 2-0 CT1 27 (SUTURE) ×6
SUT VIC AB 2-0 CT1 TAPERPNT 27 (SUTURE) ×3 IMPLANT
SUT VIC AB 4-0 PS2 27 (SUTURE) ×1 IMPLANT
SUT VLOC 180 0 24IN GS25 (SUTURE) ×2 IMPLANT
TOWEL OR 17X26 10 PK STRL BLUE (TOWEL DISPOSABLE) ×6 IMPLANT
TRAY FOLEY CATH 14FRSI W/METER (CATHETERS) ×2 IMPLANT
WATER STERILE IRR 1500ML POUR (IV SOLUTION) ×2 IMPLANT
WRAP KNEE MAXI GEL POST OP (GAUZE/BANDAGES/DRESSINGS) ×2 IMPLANT

## 2011-10-14 NOTE — Plan of Care (Signed)
Problem: Consults Goal: Diagnosis- Total Joint Replacement Primary Total Knee Left     

## 2011-10-14 NOTE — Anesthesia Postprocedure Evaluation (Signed)
  Anesthesia Post-op Note  Patient: Lori Bradford  Procedure(s) Performed: Procedure(s) (LRB): COMPUTER ASSISTED TOTAL KNEE ARTHROPLASTY (Left)  Patient Location: PACU  Anesthesia Type: General  Level of Consciousness: oriented and sedated  Airway and Oxygen Therapy: Patient Spontanous Breathing and Patient connected to nasal cannula oxygen  Post-op Pain: mild  Post-op Assessment: Post-op Vital signs reviewed, Patient's Cardiovascular Status Stable, Respiratory Function Stable and Patent Airway  Post-op Vital Signs: stable  Complications: No apparent anesthesia complications

## 2011-10-14 NOTE — Preoperative (Signed)
Beta Blockers   Reason not to administer Beta Blockers:Not Applicable 

## 2011-10-14 NOTE — Brief Op Note (Signed)
10/14/2011  2:34 PM  PATIENT:  [name]  66 y.o. female  PRE-OPERATIVE DIAGNOSIS:  Severe osteoarthritis left knee  POST-OPERATIVE DIAGNOSIS:  Severe osteoarthritis left knee  PROCEDURE:  Procedure(s) (LRB): COMPUTER ASSISTED TOTAL KNEE ARTHROPLASTY (Left)  SURGEON:  Surgeon(s) and Role:    * Kathryne Hitch, MD - Primary  PHYSICIAN ASSISTANT:   ASSISTANTS: none   ANESTHESIA:   general  EBL:  Total I/O In: 2200 [I.V.:2200] Out: 400 [Urine:100; Blood:300]  BLOOD ADMINISTERED:none  DRAINS: (medium) Hemovact drain(s) in the knee joint with  Suction Open   LOCAL MEDICATIONS USED:  NONE  SPECIMEN:  No Specimen and Excision  DISPOSITION OF SPECIMEN:  N/A  COUNTS:  YES  TOURNIQUET:   Total Tourniquet Time Documented: Thigh (Left) - 108 minutes  DICTATION: .Other Dictation: Dictation Number [id number]  PLAN OF CARE: Admit to inpatient   PATIENT DISPOSITION:  PACU - hemodynamically stable.   Delay start of Pharmacological VTE agent (>24hrs) due to surgical blood loss or risk of bleeding: no
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
## 2011-10-14 NOTE — Anesthesia Preprocedure Evaluation (Signed)
Anesthesia Evaluation  Patient identified by MRN, date of birth, ID band Patient awake    Reviewed: Allergy & Precautions, H&P , NPO status , Patient's Chart, lab work & pertinent test results, reviewed documented beta blocker date and time   Airway Mallampati: II TM Distance: >3 FB     Dental  (+) Teeth Intact and Dental Advisory Given   Pulmonary neg pulmonary ROS,  breath sounds clear to auscultation        Cardiovascular hypertension, Pt. on medications Rhythm:Regular Rate:Normal  Denies cardiac symptoms   Neuro/Psych negative neurological ROS  negative psych ROS   GI/Hepatic negative GI ROS, Neg liver ROS,   Endo/Other  negative endocrine ROSMorbid obesity  Renal/GU negative Renal ROS  negative genitourinary   Musculoskeletal   Abdominal   Peds negative pediatric ROS (+)  Hematology negative hematology ROS (+)   Anesthesia Other Findings   Reproductive/Obstetrics negative OB ROS                           Anesthesia Physical Anesthesia Plan  ASA: II  Anesthesia Plan: General   Post-op Pain Management:    Induction: Intravenous  Airway Management Planned: Oral ETT  Additional Equipment:   Intra-op Plan:   Post-operative Plan: Extubation in OR  Informed Consent: I have reviewed the patients History and Physical, chart, labs and discussed the procedure including the risks, benefits and alternatives for the proposed anesthesia with the patient or authorized representative who has indicated his/her understanding and acceptance.   Dental advisory given  Plan Discussed with: CRNA and Surgeon  Anesthesia Plan Comments:         Anesthesia Quick Evaluation

## 2011-10-14 NOTE — Transfer of Care (Signed)
Immediate Anesthesia Transfer of Care Note  Patient: Lori Bradford  Procedure(s) Performed: Procedure(s) (LRB): COMPUTER ASSISTED TOTAL KNEE ARTHROPLASTY (Left)  Patient Location: PACU  Anesthesia Type: General  Level of Consciousness: awake, alert , oriented and patient cooperative  Airway & Oxygen Therapy: Patient Spontanous Breathing and Patient connected to face mask oxygen  Post-op Assessment: Report given to PACU RN and Post -op Vital signs reviewed and stable  Post vital signs: Reviewed and stable  Complications: No apparent anesthesia complications

## 2011-10-15 LAB — CBC
Hemoglobin: 10.1 g/dL — ABNORMAL LOW (ref 12.0–15.0)
MCH: 30.3 pg (ref 26.0–34.0)
MCHC: 33.4 g/dL (ref 30.0–36.0)
MCV: 90.7 fL (ref 78.0–100.0)
RBC: 3.33 MIL/uL — ABNORMAL LOW (ref 3.87–5.11)

## 2011-10-15 LAB — BASIC METABOLIC PANEL
CO2: 24 mEq/L (ref 19–32)
Calcium: 8.7 mg/dL (ref 8.4–10.5)
Creatinine, Ser: 0.52 mg/dL (ref 0.50–1.10)
GFR calc non Af Amer: >90 mL/min (ref >90)
Glucose, Bld: 178 mg/dL — ABNORMAL HIGH (ref 70–99)
Sodium: 132 mEq/L — ABNORMAL LOW (ref 135–145)

## 2011-10-15 MED ORDER — RIVAROXABAN 10 MG PO TABS: 10.0000 mg | ORAL_TABLET | Freq: Every day | ORAL | Status: DC

## 2011-10-15 MED ORDER — METHOCARBAMOL 500 MG PO TABS: 500.0000 mg | ORAL_TABLET | Freq: Four times a day (QID) | ORAL | Status: AC | PRN

## 2011-10-15 MED ORDER — FERROUS SULFATE 325 (65 FE) MG PO TABS
325.0000 mg | ORAL_TABLET | Freq: Two times a day (BID) | ORAL | Status: DC
  Administered 2011-10-15 – 2011-10-16 (×3): 325 mg via ORAL
  Filled 2011-10-15 (×5): qty 1

## 2011-10-15 MED ORDER — OXYCODONE-ACETAMINOPHEN 5-325 MG PO TABS: 1.0000 | ORAL_TABLET | ORAL | Status: AC | PRN

## 2011-10-15 NOTE — Op Note (Signed)
NAMEPATRENA, Lori Bradford               ACCOUNT NO.:  [id number]  MEDICAL RECORD NO.:  [id number]  LOCATION:  1616                         FACILITY:  [facility]  PHYSICIAN:  Vanita Panda. Magnus Ivan, M.D.DATE OF BIRTH:  11/17/1945  DATE OF PROCEDURE:  10/14/2011 DATE OF DISCHARGE:                              OPERATIVE REPORT   PREOPERATIVE DIAGNOSIS:  End-stage arthritis and degenerative joint disease, left knee.  POSTOPERATIVE DIAGNOSIS:  End-stage arthritis and degenerative joint disease, left knee.  PROCEDURE:  Left total knee arthroplasty utilizing computer navigation as assistance.  SURGEON:  Vanita Panda. Magnus Ivan, M.D.  ANESTHESIA:  General.  BLOOD LOSS:  Less than 300 mL.  TOURNIQUET TIME:  Less than 2 hours.  COMPLICATIONS:  None.  INDICATIONS:  Ms. Matera is a 66 year old female with end-stage arthritis of both her knees, left has been much worse than right.  She has well documented evidence of failure to conservative treatment, as well as radiographs that show bone-on-bone wear with multiple osteophytes throughout the knee.  She fully understands the need to proceed with total knee arthroplasty due to the effects in her quality of life.  Her pain is daily and her mobility is very limited.  PROCEDURE:  After informed consent was obtained, the appropriate left knee was marked.  She was brought to the operating room, placed supine on the operating table.  General anesthesia was then obtained and a sterile tourniquet was placed around her upper left thigh and her left leg was prepped and draped with DuraPrep and sterile drapes including sterile stockinette.  A time-out was called to identify the correct patient and correct left knee.  I then used the Esmarch wrap out the leg and tourniquet was inflated to 325 mm of pressure.  I then made a midline incision directly over the knee and dissected down to the knee joint.  I performed a medial parapatellar arthrotomy to expose  the knee joint.  Of note, preoperatively she had flexion contracture of 20 degrees and varus angulation of 8 degrees.  Once I had the knee opened and cleaned the knee of remnants of the meniscus, ACL, PCL, as well as osteophytes throughout the knee, I then began with a computer-guided navigation portion of the case.  Two temporary Steinmann pins were placed in the tibia and 2 temporary Steinmann pins were placed in the femur.  Off these pins, small globes were placed, so I could map out a model of the knee and did a generator model of the aforementioned deformities.  Based on our computer navigated plan, then I took 10 mm off the high side of the tibia.  I then used a soft tissue balancer and tensioner to balance in flexion and extension.  I then chose a size 3 femur to make my distal femoral cut at about 11-12 mm.  This did balance me in extension once I released more medial.  I then went back to the femur, set my rotation guide off my computer plan.  I then made my anterior and posterior cuts as well as my chamfer cuts.  I then made my femoral box cut.  Once this was made, I went back to the tibia, I sized  for size #3 tibia and drilled for the post and punched for the keel with that and placed the trial tibia, placed a trial size 3 femur, and then we trialed several mm polyethylene down the way to the 15 __________ stable.  Once I cleaned the knee completely off osteophytes especially in the back of the knee.  I then cut underneath the patella and drilled for a 35 patellar button.  I then removed all trial components, cleaned the knee of debris copiously with normal saline solution and pulsatile lavage.  I then cemented the real size 3 tibial tray followed by the real size 3 femur, and placed a 15 mm polyethylene insert and cemented the 35 mm patellar button.  Once the cement had dried, we let the tourniquet down.  Hemostasis was obtained with electrocautery, and felt like it improved  her alignment significantly.  I could not get her to full extension, but there was just a few degrees, but it is much better than when she was and I have significantly corrected her varus deformity.  I then placed a medium Hemovac drain within the arthrotomy and closed the arthrotomy with a running V-Loc 0 suture followed by 2-0 Vicryl in the deep and subcutaneous tissue and staples on the skin.  All Steinmann pins were removed as well.  Xeroform followed by well-padded sterile dressings were applied, and the patient's knee was placed in the knee immobilizer.  She was awakened, extubated, and taken to the recovery room in stable condition.  All final counts were correct. There were no complications noted.     Vanita Panda. Magnus Ivan, M.D.     CYB/MEDQ  D:  10/14/2011  T:  10/15/2011  Job:  [id number]

## 2011-10-15 NOTE — Plan of Care (Signed)
Problem: Consults Goal: Diagnosis- Total Joint Replacement Primary Total Knee Left     

## 2011-10-15 NOTE — Progress Notes (Signed)
Cm spoke with pt concerning dc planning. Per pt choice [facility] to provide Hh services upon dc. Pt's spouse to assist in home care. Pt request RW. DME delivery scheduled to room prior to dc home.    Leonie Green [phone]

## 2011-10-15 NOTE — Evaluation (Signed)
Physical Therapy Evaluation Patient Details Name: Lori Bradford MRN: [id number] DOB: [birth year] Today's Date: 10/15/2011 Time: 6213-0865    PT Assessment / Plan / Recommendation Clinical Impression  pt is POD#1 s/p LEFT TKA who will benefit from PT to maximize indpendence for home setting;    PT Assessment  Patient needs continued PT services    Follow Up Recommendations  Home health PT    Barriers to Discharge None      lEquipment Recommendations  Rolling walker with 5" wheels    Recommendations for Other Services     Frequency 7X/week    Precautions / Restrictions Precautions Precautions: Knee Precaution Comments: no pillow under knee Restrictions LLE Weight Bearing: Weight bearing as tolerated   Pertinent Vitals/Pain       Mobility  Bed Mobility Bed Mobility: Supine to Sit Supine to Sit: 4: Min assist Details for Bed Mobility Assistance: min with RLE and cues to use UEs to self assist Transfers Transfers: Sit to Stand;Stand to Sit Sit to Stand: 4: Min assist;From bed;With upper extremity assist Stand to Sit: 4: Min assist;To chair/3-in-1;With armrests Details for Transfer Assistance: cues for hand placement and safety Ambulation/Gait Ambulation/Gait Assistance: 4: Min assist Ambulation Distance (Feet): 10 Feet (X2) Assistive device: Rolling walker Ambulation/Gait Assistance Details: cues for sequence and RW distance from self Gait Pattern: Step-to pattern    Exercises Total Joint Exercises Ankle Circles/Pumps: AROM;10 reps;Both Quad Sets: 5 reps;Both   PT Diagnosis: Difficulty walking  PT Problem List: Decreased strength;Decreased range of motion;Decreased activity tolerance;Decreased mobility;Decreased knowledge of precautions;Pain;Decreased knowledge of use of DME PT Treatment Interventions: DME instruction;Gait training;Stair training;Functional mobility training;Therapeutic activities;Therapeutic exercise;Patient/family education   PT Goals Acute  Rehab PT Goals PT Goal Formulation: With patient Pt will go Supine/Side to Sit: with supervision PT Goal: Supine/Side to Sit - Progress: Goal set today Pt will go Sit to Supine/Side: with supervision PT Goal: Sit to Supine/Side - Progress: Goal set today Pt will go Sit to Stand: with supervision PT Goal: Sit to Stand - Progress: Goal set today Pt will go Stand to Sit: with supervision PT Goal: Stand to Sit - Progress: Goal set today Pt will Ambulate: 51 - 150 feet;with supervision;with rolling walker PT Goal: Ambulate - Progress: Goal set today Pt will Go Up / Down Stairs: 3-5 stairs;with min assist;with rail(s) PT Goal: Up/Down Stairs - Progress: Goal set today  Visit Information  Last PT Received On: 10/15/11 Assistance Needed: +1    Subjective Data  Subjective: I want to potty Patient Stated Goal: to return to independence   Prior Functioning  Home Living Lives With: Other (Comment) (ex husband) Type of Home: Mobile home Home Access: Stairs to enter Secretary/administrator of Steps: 2 Entrance Stairs-Rails: Right;Left;Can reach both Home Layout: One level Bathroom Shower/Tub: Health visitor: Handicapped height Home Adaptive Equipment: Straight cane Additional Comments: pt lives with her ex-husband, he has some STM deficits and pt is his "cargiver" helps him with meds/supervision etc Prior Function Level of Independence: Independent Communication Communication: No difficulties    Cognition  Overall Cognitive Status: Appears within functional limits for tasks assessed/performed Arousal/Alertness: Awake/alert Orientation Level: Appears intact for tasks assessed Behavior During Session: [facility] for tasks performed    Extremity/Trunk Assessment Right Upper Extremity Assessment RUE ROM/Strength/Tone: [facility] for tasks assessed Left Upper Extremity Assessment LUE ROM/Strength/Tone: [facility] for tasks assessed Right Lower Extremity Assessment RLE ROM/Strength/Tone: [facility]  for tasks assessed RLE ROM/Strength/Tone Deficits: able to do SLR; ankle WFL Left Lower Extremity  Assessment LLE ROM/Strength/Tone: Deficits LLE ROM/Strength/Tone Deficits: able to do SLR independently this am; ankle WFL   Balance    End of Session PT - End of Session Equipment Utilized During Treatment: Gait belt;Other (comment) (KI not used this am, I SLR) Activity Tolerance: Patient tolerated treatment well Patient left: in chair;with call bell/phone within reach Nurse Communication: Mobility status   [facility] 10/15/2011, 12:46 PM

## 2011-10-15 NOTE — Progress Notes (Signed)
10/15/11 1500  PT Visit Information  Last PT Received On 10/15/11  Assistance Needed +1  PT Time Calculation  PT Start Time 1430  PT Stop Time 1501  PT Time Calculation (min) 31 min  Subjective Data  Subjective im sleepy  Precautions  Precautions Knee  Precaution Comments no pillow under knee  Restrictions  LLE Weight Bearing WBAT  Cognition  Overall Cognitive Status Appears within functional limits for tasks assessed/performed  Arousal/Alertness Awake/alert  Orientation Level Appears intact for tasks assessed  Behavior During Session [facility] for tasks performed  Bed Mobility  Bed Mobility Sit to Supine  Sit to Supine 5: Supervision  Details for Bed Mobility Assistance cues to self assist   Transfers  Transfers Sit to Stand;Stand to Sit  Sit to Stand 4: Min guard;4: Min assist;From chair/3-in-1;With upper extremity assist  Stand to Sit 4: Min guard;4: Min assist;With upper extremity assist;To chair/3-in-1;To bed  Details for Transfer Assistance cues for hand placement and safety  Ambulation/Gait  Ambulation/Gait Assistance 4: Min assist  Ambulation Distance (Feet) 10 Feet (X2)  Assistive device Rolling walker  Ambulation/Gait Assistance Details cues for sequence and RW distance from self  Gait Pattern Step-to pattern;Antalgic  Total Joint Exercises  Ankle Circles/Pumps AROM;10 reps;Both  Quad Sets AROM;Both;10 reps  Heel Slides AROM;AAROM;Left;10 reps  Straight Leg Raises AROM;AAROM;Left;10 reps  PT - End of Session  Activity Tolerance Patient tolerated treatment well  Patient left in chair;with call bell/phone within reach  PT - Assessment/Plan  Comments on Treatment Session pt continues to progress well; wants to D/C in am  PT Plan Discharge plan remains appropriate;Frequency remains appropriate  PT Frequency 7X/week  Follow Up Recommendations Home health PT  Equipment Recommended Rolling walker with 5" wheels  Acute Rehab PT Goals  Time For Goal Achievement 10/15/11   Potential to Achieve Goals Good  Pt will go Sit to Supine/Side with supervision  PT Goal: Sit to Supine/Side - Progress Progressing toward goal  Pt will go Sit to Stand with supervision  PT Goal: Sit to Stand - Progress Progressing toward goal  Pt will go Stand to Sit with supervision  PT Goal: Stand to Sit - Progress Progressing toward goal  Pt will Ambulate 51 - 150 feet;with supervision;with rolling walker  PT Goal: Ambulate - Progress Progressing toward goal

## 2011-10-15 NOTE — Progress Notes (Signed)
Subjective: 1 Day Post-Op Procedure(s) (LRB): COMPUTER ASSISTED TOTAL KNEE ARTHROPLASTY (Left) Pain is tolerated well with PCA. Knee feels better.  Patient reports pain as moderate.    Objective:   VITALS:  Temp:  [97.7 F (36.5 C)-98.5 F (36.9 C)] 97.8 F (36.6 C) (05/11 0950) Pulse Rate:  [73-97] 73  (05/11 0950) Resp:  [14-19] 18  (05/11 0950) BP: (96-159)/(46-85) 96/62 mmHg (05/11 0950) SpO2:  [95 %-100 %] 98 % (05/11 0950) Weight:  [105.8 kg (233 lb 4 oz)] 105.8 kg (233 lb 4 oz) (05/10 1622)  Neurologically intact ABD soft Neurovascular intact Sensation intact distally Intact pulses distally Dorsiflexion/Plantar flexion intact Incision: dressing C/D/I Homan's negative. Hemovac discontinued.  LABS  Basename 10/15/11 0423  HGB 10.1*  WBC 13.5*  PLT 262    Basename 10/15/11 0423  NA 132*  K 3.8  CL 96  CO2 24  BUN 8  CREATININE 0.52  GLUCOSE 178*   No results found for this basename: LABPT:2,INR:2 in the last 72 hours   Assessment/Plan: 1 Day Post-Op Procedure(s) (LRB): COMPUTER ASSISTED TOTAL KNEE ARTHROPLASTY (Left)  Advance diet Up with therapy D/C IV fluids Dressing change in the am. Lori Bradford E 10/15/2011, 10:18 AM

## 2011-10-16 LAB — CBC
MCH: 30.4 pg (ref 26.0–34.0)
MCHC: 33.2 g/dL (ref 30.0–36.0)
MCV: 91.5 fL (ref 78.0–100.0)
Platelets: 260 10*3/uL (ref 150–400)
RBC: 2.93 MIL/uL — ABNORMAL LOW (ref 3.87–5.11)
RDW: 13.7 % (ref 11.5–15.5)

## 2011-10-16 NOTE — Progress Notes (Signed)
Subjective: 2 Days Post-Op Procedure(s) (LRB): COMPUTER ASSISTED TOTAL KNEE ARTHROPLASTY (Left)Awake Alert Oriented x 4. I want to go home. Still on O2 pnc, IVF still in place. No history of previous knee surgery.  Patient reports pain as mild.    Objective:   VITALS:  Temp:  [97.8 F (36.6 C)-98.9 F (37.2 C)] 98.8 F (37.1 C) (05/12 0639) Pulse Rate:  [73-101] 76  (05/12 0639) Resp:  [16-18] 16  (05/12 0749) BP: (96-135)/(60-74) 102/60 mmHg (05/12 0639) SpO2:  [98 %-100 %] 100 % (05/12 0749)  Neurologically intact ABD soft Neurovascular intact Sensation intact distally Intact pulses distally Dorsiflexion/Plantar flexion intact Incision: no drainage   LABS  Basename 10/16/11 0516 10/15/11 0423  HGB 8.9* 10.1*  WBC 15.3* 13.5*  PLT 260 262    Basename 10/15/11 0423  NA 132*  K 3.8  CL 96  CO2 24  BUN 8  CREATININE 0.52  GLUCOSE 178*   No results found for this basename: LABPT:2,INR:2 in the last 72 hours   Assessment/Plan: 2 Days Post-Op Procedure(s) (LRB): COMPUTER ASSISTED TOTAL KNEE ARTHROPLASTY (Left)  Advance diet Up with therapy D/C IV fluids Discharge home with home health  Lori Bradford E 10/16/2011, 9:31 AM
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
## 2011-10-16 NOTE — Progress Notes (Signed)
Discharged from floor via w/c, spouse with pt. No changes in assessment. Lori Bradford   

## 2011-10-16 NOTE — Evaluation (Signed)
Occupational Therapy Evaluation Patient Details Name: Lori Bradford MRN: [id number] DOB: [birth year] Today's Date: 10/16/2011 Time: 9147-8295 OT Time Calculation (min): 24 min  OT Assessment / Plan / Recommendation Clinical Impression  Pt admitted for L TKA.  At a supervision level in mobility.  Will rely on ex husband for LB ADL.  Recommend home with initial 24 hour care.  No further OT needs.    OT Assessment  Patient does not need any further OT services    Follow Up Recommendations  No OT follow up;Supervision/Assistance - 24 hour    Barriers to Discharge      Equipment Recommendations       Recommendations for Other Services    Frequency       Precautions / Restrictions Precautions Precautions: Knee Precaution Comments: no pillow under knee Restrictions Weight Bearing Restrictions: No LLE Weight Bearing: Weight bearing as tolerated   Pertinent Vitals/Pain 2/10 L knee    ADL  Eating/Feeding: Simulated;Independent Where Assessed - Eating/Feeding: Chair Grooming: Performed;Wash/dry hands;Wash/dry face;Supervision/safety Where Assessed - Grooming: Standing at sink Upper Body Bathing: Simulated;Set up Where Assessed - Upper Body Bathing: Sitting, bed Lower Body Bathing: Simulated;Minimal assistance Where Assessed - Lower Body Bathing: Sitting, bed;Sit to stand from bed Upper Body Dressing: Performed;Set up Where Assessed - Upper Body Dressing: Sitting, bed Lower Body Dressing: Performed;Minimal assistance Where Assessed - Lower Body Dressing: Sitting, bed;Sit to stand from bed Toilet Transfer: Performed;Supervision/safety Toilet Transfer Method: Ambulating Toilet Transfer Equipment: Raised toilet seat with arms (or 3-in-1 over toilet) Toileting - Clothing Manipulation: Performed;Modified independent Where Assessed - Toileting Clothing Manipulation: Standing Toileting - Hygiene: Independent Where Assessed - Toileting Hygiene: Sit to stand from 3-in-1 or  toilet Equipment Used: Rolling walker Ambulation Related to ADLs: supervision for ambulation with RW ADL Comments: Pt is knowledgeable in uses of 3 in1, but declining for home use.  Pt will rely on ex husband as needed for LB ADL, no interest in AE.       :     Visit Information  Last OT Received On: 10/16/11 Assistance Needed: +1    Subjective Data  Subjective: "I'm going home soon." Patient Stated Goal: Home with assist of ex husband.   Prior Functioning  Home Living Lives With: Significant other Available Help at Discharge: Available 24 hours/day Type of Home: Mobile home Home Access: Stairs to enter Entrance Stairs-Number of Steps: 2 Entrance Stairs-Rails: Right;Left;Can reach both Home Layout: One level Bathroom Shower/Tub: Health visitor: Handicapped height Home Adaptive Equipment: Straight cane Additional Comments: pt lives with her ex-husband, he has some STM deficits and pt is his "cargiver" helps him with meds/supervision etc Prior Function Level of Independence: Independent Able to Take Stairs?: Yes Driving: Yes Communication Communication: No difficulties Dominant Hand: Right    Cognition  Overall Cognitive Status: Appears within functional limits for tasks assessed/performed Arousal/Alertness: Awake/alert Orientation Level: Appears intact for tasks assessed Behavior During Session: [facility] for tasks performed    Extremity/Trunk Assessment Right Upper Extremity Assessment RUE ROM/Strength/Tone: WFL for tasks assessed RUE Coordination: WFL - gross/fine motor Left Upper Extremity Assessment LUE ROM/Strength/Tone: WFL for tasks assessed LUE Coordination: WFL - gross/fine motor   Mobility Bed Mobility Bed Mobility: Sit to Supine Supine to Sit: 7: Independent;HOB flat Transfers Sit to Stand: 5: Supervision;From toilet;From bed;With upper extremity assist (verbal cues for hand placement) Stand to Sit: 5: Supervision;With upper extremity  assist;To chair/3-in-1;To toilet (verbal cues for hand placement)  End of Session OT - End of Session Activity Tolerance: Patient tolerated treatment well Patient left: in chair;with family/visitor present;with call bell/phone within reach Nurse Communication: Other (comment) (pt states her ride is here for d/c) CPM Left Knee CPM Left Knee: Off   Evern Bio 10/16/2011, 10:35 AM

## 2011-10-18 ENCOUNTER — Encounter (HOSPITAL_COMMUNITY): Payer: Self-pay | Admitting: Orthopaedic Surgery

## 2011-11-03 ENCOUNTER — Ambulatory Visit (HOSPITAL_COMMUNITY)
Admission: RE | Admit: 2011-11-03 | Discharge: 2011-11-03 | Disposition: A | Discharge status: Home / Self Care | Payer: Medicare Other | Source: Ambulatory Visit | Attending: Family Medicine | Admitting: Family Medicine

## 2011-11-03 DIAGNOSIS — M7989 Other specified soft tissue disorders: Secondary | ICD-10-CM | POA: Insufficient documentation

## 2011-11-03 DIAGNOSIS — M79609 Pain in unspecified limb: Secondary | ICD-10-CM | POA: Insufficient documentation

## 2011-11-03 DIAGNOSIS — M79606 Pain in leg, unspecified: Secondary | ICD-10-CM

## 2011-12-12 ENCOUNTER — Other Ambulatory Visit: Payer: Self-pay | Admitting: *Deleted

## 2011-12-12 MED ORDER — ALPRAZOLAM 0.5 MG PO TABS: 0.5000 mg | ORAL_TABLET | Freq: Every evening | ORAL | Status: DC | PRN

## 2011-12-12 MED ORDER — LISINOPRIL-HYDROCHLOROTHIAZIDE 20-12.5 MG PO TABS: 1.0000 | ORAL_TABLET | Freq: Every day | ORAL | Status: DC

## 2011-12-14 NOTE — Telephone Encounter (Signed)
Called into pharmacy

## 2011-12-29 ENCOUNTER — Ambulatory Visit (INDEPENDENT_AMBULATORY_CARE_PROVIDER_SITE_OTHER): Payer: Medicare Other | Admitting: Internal Medicine

## 2011-12-29 ENCOUNTER — Encounter: Payer: Self-pay | Admitting: Internal Medicine

## 2011-12-29 VITALS — BP 145/67 | HR 68 | Temp 97.9°F | Ht 63.0 in | Wt 224.4 lb

## 2011-12-29 DIAGNOSIS — Z0000 Encounter for general adult medical examination without abnormal findings: Secondary | ICD-10-CM

## 2011-12-29 DIAGNOSIS — R7309 Other abnormal glucose: Secondary | ICD-10-CM

## 2011-12-29 DIAGNOSIS — Z23 Encounter for immunization: Secondary | ICD-10-CM

## 2011-12-29 DIAGNOSIS — G4700 Insomnia, unspecified: Secondary | ICD-10-CM

## 2011-12-29 DIAGNOSIS — R739 Hyperglycemia, unspecified: Secondary | ICD-10-CM

## 2011-12-29 DIAGNOSIS — I10 Essential (primary) hypertension: Secondary | ICD-10-CM

## 2011-12-29 DIAGNOSIS — D649 Anemia, unspecified: Secondary | ICD-10-CM

## 2011-12-29 LAB — ANEMIA PANEL
%SAT: 15 % — ABNORMAL LOW (ref 20–55)
ABS Retic: 49.5 10*3/uL (ref 19.0–186.0)
Ferritin: 169 ng/mL (ref 10–291)
Iron: 48 ug/dL (ref 42–145)
RBC.: 4.5 MIL/uL (ref 3.87–5.11)
TIBC: 320 ug/dL (ref 250–470)

## 2011-12-29 LAB — LIPID PANEL
Cholesterol: 218 mg/dL — ABNORMAL HIGH (ref 0–200)
Total CHOL/HDL Ratio: 3.4 Ratio

## 2011-12-29 LAB — CBC
HCT: 38.3 % (ref 36.0–46.0)
Hemoglobin: 13 g/dL (ref 12.0–15.0)
MCV: 85.1 fL (ref 78.0–100.0)
RBC: 4.5 MIL/uL (ref 3.87–5.11)
RDW: 14.5 % (ref 11.5–15.5)
WBC: 10 10*3/uL (ref 4.0–10.5)

## 2011-12-29 LAB — POCT GLYCOSYLATED HEMOGLOBIN (HGB A1C): Hemoglobin A1C: 6

## 2011-12-29 MED ORDER — ALPRAZOLAM 1 MG PO TABS: 1.0000 mg | ORAL_TABLET | Freq: Every evening | ORAL | Status: DC | PRN

## 2011-12-29 MED ORDER — LISINOPRIL-HYDROCHLOROTHIAZIDE 20-12.5 MG PO TABS: 1.0000 | ORAL_TABLET | Freq: Every day | ORAL | Status: DC

## 2011-12-29 NOTE — Patient Instructions (Signed)
Please follow-up at the clinic in 3-4 months, at which time we will reevaluate your sleeping, blood pressure - OR, please follow-up in the clinic sooner if needed.  There have been changes in your medications:  INCREASE Xanax to 1mg  at bedtime. You have been started on a new medication that can cause drowsiness, do not drive or operate heavy machinery . Do not take this medication with alcohol.    If you have been started on new medication(s), and you develop symptoms concerning for allergic reaction, including, but not limited to, throat closing, tongue swelling, rash, please stop the medication immediately and call the clinic at [phone], and go to the ER. You are getting labs today, if they are abnormal I will give you a call.  If symptoms worsen, or new symptoms arise, please call the clinic or go to the ER.  Please bring all of your medications in a bag to your next visit.   Insomnia Insomnia is frequent trouble falling and/or staying asleep. Insomnia can be a long term problem or a short term problem. Both are common. Insomnia can be a short term problem when the wakefulness is related to a certain stress or worry. Long term insomnia is often related to ongoing stress during waking hours and/or poor sleeping habits. Overtime, sleep deprivation itself can make the problem worse. Every little thing feels more severe because you are overtired and your ability to cope is decreased. CAUSES    Stress, anxiety, and depression.   Poor sleeping habits.   Distractions such as TV in the bedroom.   Naps close to bedtime.   Engaging in emotionally charged conversations before bed.   Technical reading before sleep.   Alcohol and other sedatives. They may make the problem worse. They can hurt normal sleep patterns and normal dream activity.   Stimulants such as caffeine for several hours prior to bedtime.   Pain syndromes and shortness of breath can cause insomnia.   Exercise late at  night.   Changing time zones may cause sleeping problems (jet lag).  It is sometimes helpful to have someone observe your sleeping patterns. They should look for periods of not breathing during the night (sleep apnea). They should also look to see how long those periods last. If you live alone or observers are uncertain, you can also be observed at a sleep clinic where your sleep patterns will be professionally monitored. Sleep apnea requires a checkup and treatment. Give your caregivers your medical history. Give your caregivers observations your family has made about your sleep.   SYMPTOMS    Not feeling rested in the morning.   Anxiety and restlessness at bedtime.   Difficulty falling and staying asleep.  TREATMENT    Your caregiver may prescribe treatment for an underlying medical disorders. Your caregiver can give advice or help if you are using alcohol or other drugs for self-medication. Treatment of underlying problems will usually eliminate insomnia problems.   Medications can be prescribed for short time use. They are generally not recommended for lengthy use.   Over-the-counter sleep medicines are not recommended for lengthy use. They can be habit forming.   You can promote easier sleeping by making lifestyle changes such as:   Using relaxation techniques that help with breathing and reduce muscle tension.   Exercising earlier in the day.   Changing your diet and the time of your last meal. No night time snacks.   Establish a regular time to go to bed.  Counseling can help with stressful problems and worry.   Soothing music and white noise may be helpful if there are background noises you cannot remove.   Stop tedious detailed work at least one hour before bedtime.  HOME CARE INSTRUCTIONS    Keep a diary. Inform your caregiver about your progress. This includes any medication side effects. See your caregiver regularly. Take note of:   Times when you are asleep.    Times when you are awake during the night.   The quality of your sleep.   How you feel the next day.  This information will help your caregiver care for you.  Get out of bed if you are still awake after 15 minutes. Read or do some quiet activity. Keep the lights down. Wait until you feel sleepy and go back to bed.   Keep regular sleeping and waking hours. Avoid naps.   Exercise regularly.   Avoid distractions at bedtime. Distractions include watching television or engaging in any intense or detailed activity like attempting to balance the household checkbook.   Develop a bedtime ritual. Keep a familiar routine of bathing, brushing your teeth, climbing into bed at the same time each night, listening to soothing music. Routines increase the success of falling to sleep faster.   Use relaxation techniques. This can be using breathing and muscle tension release routines. It can also include visualizing peaceful scenes. You can also help control troubling or intruding thoughts by keeping your mind occupied with boring or repetitive thoughts like the old concept of counting sheep. You can make it more creative like imagining planting one beautiful flower after another in your backyard garden.   During your day, work to eliminate stress. When this is not possible use some of the previous suggestions to help reduce the anxiety that accompanies stressful situations.  MAKE SURE YOU:    Understand these instructions.   Will watch your condition.   Will get help right away if you are not doing well or get worse.  Document Released: 05/20/2000 Document Revised: 05/12/2011 Document Reviewed: 06/20/2007 [facility] Patient Information 2012 [place], [place].

## 2011-12-29 NOTE — Assessment & Plan Note (Signed)
Assessment: The patient has chronic history of insomnia, which has been worse recently. There seemed to be multiple factors that contribute, including poor sleep hygiene (she watches TV until she falls asleep). There may also be a component of sleep apnea given the patient's body habitus, and history of snoring. She states that she's previously had a sleep study 10 years ago, and has since in fact lost weight, therefore she does not think she has sleep apnea.  Plan:      We had a long conversation regarding sleep hygiene, and instructions/ and education was given. As well, a handout was provided.   Will escalate the patient's Xanax to 1 mg each bedtime for insomnia.   We will reevaluate after these interventions are made, with consideration for sleep study in the future.
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
## 2011-12-29 NOTE — Assessment & Plan Note (Signed)
Pertinent Data: BP Readings from Last 3 Encounters:  12/29/11 145/67  10/16/11 102/60  10/16/11 102/60    Basic Metabolic Panel:    Component Value Date/Time   NA 132* 10/15/2011 0423   K 3.8 10/15/2011 0423   CL 96 10/15/2011 0423   CO2 24 10/15/2011 0423   BUN 8 10/15/2011 0423   CREATININE 0.52 10/15/2011 0423   CREATININE 0.63 09/06/2011 1145   GLUCOSE 178* 10/15/2011 0423   CALCIUM 8.7 10/15/2011 0423    Assessment: Disease Control: controlled  Progress toward goals: at goal  Barriers to meeting goals: no barriers identified    Patient is compliant all of the time with prescribed medications.   Despite recent mild hyponatremia (132) during recent admission, this seems to have been in the setting of mild hyperglycemia with a glucose of 178. Therefore, he corrected sodium is within normal limits.  Plan:  continue current medications  Refills given today.

## 2011-12-29 NOTE — Assessment & Plan Note (Addendum)
Health Maintenance  Topic Date Due  . Colonoscopy  06/22/1996  . Zostavax  06/22/2006  . Influenza Vaccine  03/06/2012  . Mammogram  09/29/2013  . Tetanus/tdap  06/07/2020  . Pneumococcal Polysaccharide Vaccine Age 65 And Over  Completed    Assessment:  Colonoscopy - The patient is overdue for a colonoscopy, and she reports blood in her stools. We had a long conversation regarding the importance of getting screening colonoscopy, to evaluate for colon cancer or other noncancerous lesions. The patient states that she understands the risks, however she absolutely refuses to do a colonoscopy at this time (even understanding that potentially if there were colon cancer, she could theoretically die from it). She refuses to do stool cards. States that she would not want further workup regardless of what was found on the stool cards or colonoscopy. She absolutely refuses both colonoscopy and stool cards today.  Zostavax - has her prescription, plans to get [facility].  Pneumovax - due, agreeable to get today.  Plan:  PCV vaccination today.   Recommended to get her Zostavax, she'll get [facility] with her prior prescription.   Recommended colonoscopy and stool cards.   Patient adamantly refuses stool cards for colonoscopy referral today.   Check lipid panel today (this has not been checked since 2010), the patient ate a sausage biscuit at 10:30 AM.   Check hemoglobin A1c, given that has recently experienced hyperglycemia during hospital course.

## 2011-12-29 NOTE — Assessment & Plan Note (Signed)
Pertinent Labs: CBC:    Component Value Date/Time   WBC 15.3* 10/16/2011 0516   HGB 8.9* 10/16/2011 0516   HCT 26.8* 10/16/2011 0516   PLT 260 10/16/2011 0516   MCV 91.5 10/16/2011 0516   NEUTROABS 5.1 06/08/2010 0429   LYMPHSABS 2.6 06/08/2010 0429   MONOABS 0.4 06/08/2010 0429   EOSABS 0.4 06/08/2010 0429   BASOSABS 0.0 06/08/2010 0429     Assessment: Patient was noted to have a normocytic anemia during her recent hospitalization in May 2013. This is likely secondary to blood loss anemia in the setting of her total knee replacement. She has not had subsequent reevaluation of this anemia. Given her history of blood in her stools, we will need to investigate further, although understanding that likely the acute decline during hospital course was secondary to her operation. Notably, the patient is asymptomatic in terms of lightheadedness, dizziness, chest pain, shortness of breath.  Plan:      CBC and anemia panel.

## 2012-01-07 NOTE — Progress Notes (Signed)
Quick Note:  LDL at this point is still under her goal LDL of 130 mg/dL per ATPIII guidelines. Will continue to monitor yearly. She needs to work on reducing fat intake. ______

## 2012-02-05 HISTORY — PX: OTHER SURGICAL HISTORY: SHX169

## 2012-03-01 ENCOUNTER — Encounter: Payer: Self-pay | Admitting: Internal Medicine

## 2012-03-14 ENCOUNTER — Encounter: Payer: Self-pay | Admitting: Internal Medicine

## 2012-03-14 DIAGNOSIS — R7303 Prediabetes: Secondary | ICD-10-CM | POA: Insufficient documentation

## 2012-03-15 ENCOUNTER — Ambulatory Visit (INDEPENDENT_AMBULATORY_CARE_PROVIDER_SITE_OTHER): Payer: Medicare Other | Admitting: Internal Medicine

## 2012-03-15 ENCOUNTER — Encounter: Payer: Self-pay | Admitting: Internal Medicine

## 2012-03-15 VITALS — BP 129/68 | HR 86 | Temp 97.5°F | Ht 63.0 in | Wt 227.5 lb

## 2012-03-15 DIAGNOSIS — F341 Dysthymic disorder: Secondary | ICD-10-CM

## 2012-03-15 DIAGNOSIS — Z0000 Encounter for general adult medical examination without abnormal findings: Secondary | ICD-10-CM

## 2012-03-15 DIAGNOSIS — M1712 Unilateral primary osteoarthritis, left knee: Secondary | ICD-10-CM

## 2012-03-15 DIAGNOSIS — G4700 Insomnia, unspecified: Secondary | ICD-10-CM

## 2012-03-15 DIAGNOSIS — R5383 Other fatigue: Secondary | ICD-10-CM

## 2012-03-15 DIAGNOSIS — I10 Essential (primary) hypertension: Secondary | ICD-10-CM

## 2012-03-15 DIAGNOSIS — R7309 Other abnormal glucose: Secondary | ICD-10-CM

## 2012-03-15 DIAGNOSIS — Z23 Encounter for immunization: Secondary | ICD-10-CM

## 2012-03-15 DIAGNOSIS — R739 Hyperglycemia, unspecified: Secondary | ICD-10-CM

## 2012-03-15 DIAGNOSIS — M1710 Unilateral primary osteoarthritis, unspecified knee: Secondary | ICD-10-CM

## 2012-03-15 LAB — COMPREHENSIVE METABOLIC PANEL WITH GFR
ALT: 16 U/L (ref 0–35)
AST: 17 U/L (ref 0–37)
Albumin: 4.1 g/dL (ref 3.5–5.2)
Alkaline Phosphatase: 81 U/L (ref 39–117)
BUN: 6 mg/dL (ref 6–23)
CO2: 28 meq/L (ref 19–32)
Calcium: 9.3 mg/dL (ref 8.4–10.5)
Chloride: 101 meq/L (ref 96–112)
Creat: 0.59 mg/dL (ref 0.50–1.10)
Glucose, Bld: 105 mg/dL — ABNORMAL HIGH (ref 70–99)
Potassium: 3.8 meq/L (ref 3.5–5.3)
Sodium: 138 meq/L (ref 135–145)
Total Bilirubin: 0.4 mg/dL (ref 0.3–1.2)
Total Protein: 7 g/dL (ref 6.0–8.3)

## 2012-03-15 LAB — GLUCOSE, CAPILLARY: Glucose-Capillary: 121 mg/dL — ABNORMAL HIGH (ref 70–99)

## 2012-03-15 MED ORDER — ALPRAZOLAM 1 MG PO TABS: 1.0000 mg | ORAL_TABLET | Freq: Every evening | ORAL | Status: DC | PRN

## 2012-03-15 MED ORDER — ZOSTER VACCINE LIVE 19400 UNT/0.65ML ~~LOC~~ SOLR: 0.6500 mL | Freq: Once | SUBCUTANEOUS | Status: DC

## 2012-03-15 NOTE — Addendum Note (Signed)
Addended by: Angelina Ok F on: 03/15/2012 02:20 PM   Modules accepted: Orders
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
## 2012-03-15 NOTE — Assessment & Plan Note (Signed)
Assessment: The patient has chronic history of insomnia, which was worse during last visit. Now controlled after changing xanax to 1mg  qHS and changing sleep hygiene. Continues to snore, and is high risk for sleep apnea.  Plan:      Continue current Xanax at 1mg  qHS.  Plan order for sleep study next visit, pt refuses today.  We will reevaluate after these interventions are made, with consideration for sleep study in the future.

## 2012-03-15 NOTE — Assessment & Plan Note (Addendum)
Pertinent Data: BP Readings from Last 3 Encounters:  03/15/12 129/68  12/29/11 145/67  10/16/11 102/60    Basic Metabolic Panel:    Component Value Date/Time   NA 132* 10/15/2011 0423   K 3.8 10/15/2011 0423   CL 96 10/15/2011 0423   CO2 24 10/15/2011 0423   BUN 8 10/15/2011 0423   CREATININE 0.52 10/15/2011 0423   CREATININE 0.63 09/06/2011 1145   GLUCOSE 178* 10/15/2011 0423   CALCIUM 8.7 10/15/2011 0423    Assessment: Disease Control: controlled  Progress toward goals: at goal  Barriers to meeting goals: no barriers identified    Patient is compliant all of the time with prescribed medications.   Plan:  continue current medications  Check CMET today.

## 2012-03-15 NOTE — Assessment & Plan Note (Signed)
Assessment:  Pt is currently taking Xanax and is controlled on this medication - Was previously on prozac, but did not tolerate secondary to feeling of feeling like a zombie. Denies SI/HI. Other potential contributing factors towards her symptoms include poor relationship with her daughter, and resultant inability to have a close relationship with her granddaughter. She also recently lost her mother 2 weeks ago.  Plan:  Continue current regimen.

## 2012-03-15 NOTE — Assessment & Plan Note (Addendum)
Pertinent Labs:  Ref. Range 09/15/2009 20:37 06/08/2010 04:29 09/06/2011 11:45 10/10/2011 11:15 10/15/2011 04:23  Glucose Latest Range: 70-99 mg/dL 960 (H) 454 (H) 098 (H) 93 178 (H)    Assessment: Has had episodes of hyperglycemia on multiple office visits. Given history of obesity, at increased risk for diabetes.  Plan:      Check A1c today.   ADDENDUM TO ABOVE AFTER LABS RESULTED: Lab Results  Component Value Date   HGBA1C 6.4 03/15/2012   Assessment: Patient has prediabetes. I called and discussed these results with the patient and she agrees that early intervention will be most appropriate. She is not sure if she will be able to achieve with lifestyle modification alone. Therefore, will plan to initiate Metformin.   Plan:  Start metformin 500mg  BID - will start with 1 pill a day for 1 week, then escalate to 2 pills a day if tolerated.  Will reassess A1c in 3 months, with plan to escalate regimen if needed at that time.  Encouraged to work on lifestyle modification.

## 2012-03-15 NOTE — Patient Instructions (Addendum)
   Please follow-up at the clinic in 6 months, at which time we will reevaluate blood pressure, cholesterol, sleeping - OR, please follow-up in the clinic sooner if needed.  There have not been changes in your medications.   You are getting labs today, if they are abnormal I will give you a call.   You are doing a fantastic job with your blood pressure control - keep up the good work.  Try to increase your exercise, as you've planned - let us know if you are having worsening chest pain, difficulty breathing, shortness of breath with activity.  We will plan to order your DEXA scan and sleep study at the next visit.  If symptoms worsen, or new symptoms arise, please call the clinic or go to the ER.  Please bring all of your medications in a bag to your next visit.

## 2012-03-15 NOTE — Progress Notes (Signed)
Quick Note:  Prediabetes noted per A1c, consistent with recent values. She will need to start with lifestyle modification, recheck at next visit, with consideration to initiate therapy at that time. Alternatively, can go ahead and initiate metformin. I will call pt to inform of results, and my note will be updated to reflect updated recommendations. ______

## 2012-03-15 NOTE — Assessment & Plan Note (Addendum)
Health Maintenance  Topic Date Due  . Colonoscopy  06/22/1996  . Zostavax  06/22/2006  . Influenza Vaccine  02/05/2012  . Mammogram  09/29/2013  . Tetanus/tdap  06/07/2020  . Pneumococcal Polysaccharide Vaccine Age 65 And Over  Completed    Assessment:  Colonoscopy - The patient is overdue for a colonoscopy, and she reports blood in her stools. We had a long conversation regarding the importance of getting screening colonoscopy, to evaluate for colon cancer or other noncancerous lesions. The patient states that she understands the risks, however she absolutely refuses to do a colonoscopy at this time (even understanding that potentially if there were colon cancer, she could theoretically die from it). She refuses to do stool cards. States that she would not want further workup regardless of what was found on the stool cards or colonoscopy. She absolutely refuses both colonoscopy and stool cards today.  Zostavax - due, agreeable for RX to be sent to [facility].  Flu shot - due, agreeable to get today.  Plan:  Flu vaccination today.   RX for Zostavax sent to her [facility].   Recommended but continues to adamantly refuse colonoscopy and stool cards.   Check hemoglobin A1c and TSH, given that has recently experienced hyperglycemia during hospital course.   Plan to order DEXA and sleep study at next visit (refuses order today).

## 2012-03-16 ENCOUNTER — Telehealth: Payer: Self-pay | Admitting: *Deleted

## 2012-03-16 MED ORDER — METFORMIN HCL 500 MG PO TABS: 500.0000 mg | ORAL_TABLET | Freq: Two times a day (BID) | ORAL | Status: DC

## 2012-03-16 NOTE — Addendum Note (Signed)
Addended by: Priscella Mann on: 03/16/2012 01:26 PM   Modules accepted: Orders

## 2012-03-16 NOTE — Telephone Encounter (Signed)
Spoke with patient regarding A1c of 6.4, and that she is in the prediabetes range, with concern for eventual progression of disease if we do not intervene at this time. We have agreed that starting metformin would be the best option at this time. We will start at once a day for 1 week, then escalate to 2 times daily if tolerated.  Patient was advised of the side effects of metformin, and that if she experiences any indication or adverse reaction or allergic reaction, she should stop the medication immediately and notify the clinic or go the the ER if symptoms are severe.  Patient expresses understanding and agreed with plan of care.  Signed: Johnette Abraham, Roma Schanz, Internal Medicine Resident Pager: [phone] (7AM-5PM) 03/16/2012, 1:03 PM

## 2012-03-16 NOTE — Telephone Encounter (Signed)
Pt called clinic stating someone had tried to reach her.   Pt can be reached at [phone]. I told her you wanted to talk with her about lab results.

## 2012-08-29 ENCOUNTER — Other Ambulatory Visit: Payer: Self-pay | Admitting: Internal Medicine

## 2012-08-29 DIAGNOSIS — Z1231 Encounter for screening mammogram for malignant neoplasm of breast: Secondary | ICD-10-CM

## 2012-09-10 ENCOUNTER — Ambulatory Visit (INDEPENDENT_AMBULATORY_CARE_PROVIDER_SITE_OTHER): Payer: Medicare Other | Admitting: Internal Medicine

## 2012-09-10 ENCOUNTER — Encounter: Payer: Self-pay | Admitting: Internal Medicine

## 2012-09-10 VITALS — BP 124/67 | HR 64 | Temp 97.0°F | Ht 62.0 in | Wt 229.2 lb

## 2012-09-10 DIAGNOSIS — M8580 Other specified disorders of bone density and structure, unspecified site: Secondary | ICD-10-CM

## 2012-09-10 DIAGNOSIS — G4700 Insomnia, unspecified: Secondary | ICD-10-CM

## 2012-09-10 DIAGNOSIS — R7309 Other abnormal glucose: Secondary | ICD-10-CM

## 2012-09-10 DIAGNOSIS — Z0000 Encounter for general adult medical examination without abnormal findings: Secondary | ICD-10-CM | POA: Insufficient documentation

## 2012-09-10 DIAGNOSIS — K219 Gastro-esophageal reflux disease without esophagitis: Secondary | ICD-10-CM | POA: Insufficient documentation

## 2012-09-10 DIAGNOSIS — R0989 Other specified symptoms and signs involving the circulatory and respiratory systems: Secondary | ICD-10-CM

## 2012-09-10 DIAGNOSIS — Z23 Encounter for immunization: Secondary | ICD-10-CM

## 2012-09-10 DIAGNOSIS — E669 Obesity, unspecified: Secondary | ICD-10-CM | POA: Insufficient documentation

## 2012-09-10 DIAGNOSIS — R0683 Snoring: Secondary | ICD-10-CM | POA: Insufficient documentation

## 2012-09-10 DIAGNOSIS — F341 Dysthymic disorder: Secondary | ICD-10-CM

## 2012-09-10 DIAGNOSIS — M899 Disorder of bone, unspecified: Secondary | ICD-10-CM

## 2012-09-10 DIAGNOSIS — I10 Essential (primary) hypertension: Secondary | ICD-10-CM

## 2012-09-10 DIAGNOSIS — R7303 Prediabetes: Secondary | ICD-10-CM

## 2012-09-10 DIAGNOSIS — Z13820 Encounter for screening for osteoporosis: Secondary | ICD-10-CM

## 2012-09-10 LAB — LIPID PANEL
Cholesterol: 186 mg/dL (ref 0–200)
Triglycerides: 154 mg/dL — ABNORMAL HIGH (ref <150)

## 2012-09-10 LAB — HEMOGLOBIN A1C: Mean Plasma Glucose: 131 mg/dL — ABNORMAL HIGH (ref <117)

## 2012-09-10 MED ORDER — ALPRAZOLAM 1 MG PO TABS: 1.0000 mg | ORAL_TABLET | Freq: Every evening | ORAL | Status: DC | PRN

## 2012-09-10 MED ORDER — ZOSTER VACCINE LIVE 19400 UNT/0.65ML ~~LOC~~ SOLR: 0.6500 mL | Freq: Once | SUBCUTANEOUS | Status: DC

## 2012-09-10 MED ORDER — LISINOPRIL-HYDROCHLOROTHIAZIDE 20-12.5 MG PO TABS: 1.0000 | ORAL_TABLET | Freq: Every day | ORAL | Status: DC

## 2012-09-10 MED ORDER — OMEPRAZOLE 20 MG PO CPDR: 20.0000 mg | DELAYED_RELEASE_CAPSULE | Freq: Every day | ORAL | Status: DC

## 2012-09-10 NOTE — Assessment & Plan Note (Signed)
Pertinent Data: Lab Results  Component Value Date   HGBA1C 6.4 03/15/2012   Assessment: Was started on Metformin during last visit in 03/2012, but she stopped taking it after 1 month because causing significant fatigue.  Plan:      Recheck A1c today.  Will continue with lifestyle modification at present - handout given.

## 2012-09-10 NOTE — Progress Notes (Signed)
I discussed the patient with resident Dr. Kalia-Reynolds at the time of the visit, and I reviewed the history, findings, diagnosis, and treatment plan as outlined in the resident's note. I agree with the plans as outlined in her note.  

## 2012-09-10 NOTE — Assessment & Plan Note (Signed)
Assessment: Has history of snoring, obesity, insomnia and daytime fatigue. High risk for OSA.  Plan:      Sleep study ordered today.
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
## 2012-09-10 NOTE — Assessment & Plan Note (Signed)
Health Maintenance  Topic Date Due  . Colonoscopy  09/14/2012  . Zostavax  09/14/2012  . Influenza Vaccine  02/04/2013  . Mammogram  09/29/2013  . Tetanus/tdap  06/07/2020  . Pneumococcal Polysaccharide Vaccine Age 66 And Over  Completed    Assessment:  Procedures due: colonoscopy, DEXA, mammo  Labs due: lipid panel, A1c  Immunizations due: Zostavax  Plan:  DEXA, mammogram (completed but no result yet), A1c, lipid panel today.  Continue to refuse colonoscopy - we again today discussed risks and benefits - continue to refuse.  RX for Zostavax today.

## 2012-09-10 NOTE — Assessment & Plan Note (Signed)
Assessment: Better controlled on the Xanax 1mg  at bedtime. States now getting 5-6 hours of sleep. Also using sleep hygiene techniques we discussed. Still snoring - now agreeable for sleep study.  Plan:      Refill Xanax today - paper RX given.  Ordered sleep study today.

## 2012-09-10 NOTE — Assessment & Plan Note (Signed)
Assessment: Disease Control: not controlled  Progress toward goals: unable to assess  Barriers to meeting goals: no barriers identified   Plan:      Rx for PPI is written  She should alert me if there are persistent symptoms, dysphagia, weight loss or GI bleeding.  GERD tips reviewed including caffeine reduction, dietary changes, elevate HOB, NPO after supper, and handout provided.

## 2012-09-10 NOTE — Assessment & Plan Note (Signed)
Assessment:  Pt is currently taking Xanax and is controlled on this medication - Was previously on prozac, but did not tolerate secondary to feeling of feeling like a zombie. Denies SI/HI.   Plan:  Continue current regimen.  Medication contract to be completed today.

## 2012-09-10 NOTE — Assessment & Plan Note (Signed)
Pertinent Data: BP Readings from Last 3 Encounters:  09/10/12 124/67  03/15/12 129/68  12/29/11 145/67    Basic Metabolic Panel:    Component Value Date/Time   NA 138 03/15/2012 1408   K 3.8 03/15/2012 1408   CL 101 03/15/2012 1408   CO2 28 03/15/2012 1408   BUN 6 03/15/2012 1408   CREATININE 0.59 03/15/2012 1408   CREATININE 0.52 10/15/2011 0423   GLUCOSE 105* 03/15/2012 1408   CALCIUM 9.3 03/15/2012 1408    Assessment: Disease Control: controlled  Progress toward goals: at goal  Barriers to meeting goals: no barriers identified    Taking medications as appropriate.   Plan:  continue current medications  Educational resources provided:    Self management tools provided:

## 2012-09-10 NOTE — Patient Instructions (Signed)
General Instructions:  Please follow-up at the clinic in 6 months, at which time we will reevaluate your blood pressure, prediabetes, sleeping - OR, please follow-up in the clinic sooner if needed.  There have not been changes in your medications.   It has been a pleasure to be a part of your medical care at [facility] - thank you for allowing me the opportunity.  Please get your DEXA scan and sleep study done as soon as you can - if they are abnormal, I will call you. You are getting labs today, if they are abnormal I will give you a call.  Keep working on weight loss and eating more healthy, you can do it!!  If symptoms worsen, or new symptoms arise, please call the clinic or go to the ER.  PLEASE BRING ALL OF YOUR MEDICATIONS  IN A BAG TO YOUR NEXT APPOINTMENT   Treatment Goals:  Goals (1 Years of Data) as of 09/10/12         As of Today 03/15/12 12/29/11 10/16/11 10/15/11     Blood Pressure    . Blood Pressure < 140/90  124/67 129/68 145/67 102/60 104/66      Progress Toward Treatment Goals:  Treatment Goal 09/10/2012  Blood pressure at goal    Self Care Goals & Plans:  Self Care Goal 09/10/2012  Manage my medications take my medicines as prescribed; bring my medications to every visit; refill my medications on time  Monitor my health keep track of my blood pressure  Eat healthy foods eat baked foods instead of fried foods; eat foods that are low in salt; eat more vegetables; drink diet soda or water instead of juice or soda; eat smaller portions  Be physically active take a walk every day       Care Management & Community Referrals:

## 2012-09-10 NOTE — Addendum Note (Signed)
Addended by: Priscella Mann on: 09/10/2012 12:40 PM   Modules accepted: Orders

## 2012-09-13 ENCOUNTER — Encounter: Payer: Medicare Other | Admitting: Internal Medicine

## 2012-09-17 ENCOUNTER — Ambulatory Visit (HOSPITAL_COMMUNITY): Payer: Medicare Other

## 2012-10-01 ENCOUNTER — Ambulatory Visit (HOSPITAL_COMMUNITY)
Admission: RE | Admit: 2012-10-01 | Discharge: 2012-10-01 | Disposition: A | Discharge status: Home / Self Care | Payer: Medicare Other | Source: Ambulatory Visit | Attending: Internal Medicine | Admitting: Internal Medicine

## 2012-10-01 ENCOUNTER — Ambulatory Visit (HOSPITAL_COMMUNITY): Payer: Medicare Other

## 2012-10-01 ENCOUNTER — Other Ambulatory Visit: Payer: Self-pay | Admitting: Internal Medicine

## 2012-10-01 DIAGNOSIS — Z13820 Encounter for screening for osteoporosis: Secondary | ICD-10-CM

## 2012-10-01 DIAGNOSIS — M1990 Unspecified osteoarthritis, unspecified site: Secondary | ICD-10-CM | POA: Insufficient documentation

## 2012-10-01 DIAGNOSIS — Z1231 Encounter for screening mammogram for malignant neoplasm of breast: Secondary | ICD-10-CM | POA: Insufficient documentation

## 2012-10-01 DIAGNOSIS — Z780 Asymptomatic menopausal state: Secondary | ICD-10-CM | POA: Insufficient documentation

## 2012-10-02 ENCOUNTER — Encounter: Payer: Self-pay | Admitting: Internal Medicine

## 2012-10-02 ENCOUNTER — Telehealth: Payer: Self-pay | Admitting: *Deleted

## 2012-10-02 DIAGNOSIS — M810 Age-related osteoporosis without current pathological fracture: Secondary | ICD-10-CM | POA: Insufficient documentation

## 2012-10-02 MED ORDER — CALCIUM CARB-CHOLECALCIFEROL 600-800 MG-UNIT PO TABS: 600.0000 mg | ORAL_TABLET | Freq: Two times a day (BID) | ORAL | Status: DC

## 2012-10-02 NOTE — Assessment & Plan Note (Addendum)
ADDENDUM TO PLAN AFTER LABS RESULTED: 10/02/2012, 9:55 AM  Pertinent Data Reviewed: DEXA Scan (10/01/2012) - Femoral T score -2, forearm T score -0.3.  Basic Metabolic Panel:    Component Value Date/Time   NA 138 03/15/2012 1408   K 3.8 03/15/2012 1408   CL 101 03/15/2012 1408   CO2 28 03/15/2012 1408   BUN 6 03/15/2012 1408   CREATININE 0.59 03/15/2012 1408   CREATININE 0.52 10/15/2011 0423   GLUCOSE 105* 03/15/2012 1408   CALCIUM 9.3 03/15/2012 1408     Assessment: Per the patient's most recent DEXA scan, which was ordered for screening purposes, she is noted to have osteopenia. Her FRAX score indicates an approximately 10 year risk of major osteoporotic fracture of 9.6% and 1.4% risk of hip fracture. As her 10 year risk of major osteoporotic fracture is < 20 % and hip fracture risk < 3%, will not initiate bisphosphonate therapy today (per uptodate), further supported by the fact that she has not had any major fractures today.   I will however initiate calcium and vitamin D supplementation today.  Plan:  Start calcium-vitamin D.  I called the patient in attempt to discuss results above. I left a voice message requesting return call. We'll also have triaged continue to try to get a hold of the patient to inform her of the above.

## 2012-10-02 NOTE — Progress Notes (Signed)
Quick Note:  Will add ca-vitamin D supplementation. Will need to recheck BMET next 2-3 months after starting the ca. ______

## 2012-10-02 NOTE — Addendum Note (Signed)
Addended by: Priscella Mann on: 10/02/2012 10:23 AM   Modules accepted: Orders

## 2012-10-02 NOTE — Telephone Encounter (Signed)
Call to pt to discuss lab results and plan for follow up per Dr. Thad Ranger.  Message left for pt to call the Clinics as soon as possible and to ask for Dr. Thad Ranger or Venita Sheffield.  Angelina Ok, RN 10/02/2012 10:42 AM.

## 2012-10-03 ENCOUNTER — Telehealth: Payer: Self-pay | Admitting: *Deleted

## 2012-10-03 NOTE — Telephone Encounter (Signed)
Return call to pt given results of Mammogram and recommendation to repeat in 1 year.  Given results of Dexa Scan and informed of need to start Calcium and Vit-D tablets 2 daily per order of Dr. Thad Ranger  Med has been sent to pt's Pharmacy..  Pt voiced understanding of the findings and treatment plans.  Angelina Ok, RN 10/03/2012 10:51AM.

## 2013-01-29 ENCOUNTER — Ambulatory Visit (INDEPENDENT_AMBULATORY_CARE_PROVIDER_SITE_OTHER): Payer: Medicare Other | Admitting: Internal Medicine

## 2013-01-29 ENCOUNTER — Encounter: Payer: Self-pay | Admitting: Internal Medicine

## 2013-01-29 VITALS — BP 113/66 | HR 64 | Temp 96.8°F | Ht 62.0 in | Wt 232.2 lb

## 2013-01-29 DIAGNOSIS — G4700 Insomnia, unspecified: Secondary | ICD-10-CM

## 2013-01-29 DIAGNOSIS — I10 Essential (primary) hypertension: Secondary | ICD-10-CM

## 2013-01-29 DIAGNOSIS — Z23 Encounter for immunization: Secondary | ICD-10-CM

## 2013-01-29 DIAGNOSIS — R7309 Other abnormal glucose: Secondary | ICD-10-CM

## 2013-01-29 DIAGNOSIS — K219 Gastro-esophageal reflux disease without esophagitis: Secondary | ICD-10-CM

## 2013-01-29 DIAGNOSIS — R7303 Prediabetes: Secondary | ICD-10-CM

## 2013-01-29 MED ORDER — ALPRAZOLAM 1 MG PO TABS: 1.0000 mg | ORAL_TABLET | Freq: Every evening | ORAL | Status: DC | PRN

## 2013-01-29 MED ORDER — OMEPRAZOLE 20 MG PO CPDR: 20.0000 mg | DELAYED_RELEASE_CAPSULE | Freq: Every day | ORAL | Status: DC

## 2013-01-29 NOTE — Patient Instructions (Signed)
We will give you refills of the Alprazolam to help with sleep and Omeprazole. Also we will like you to continue taking your metformin as previously prescribed. We will also be doing some blood work and giving you your flu shot today. We will like to see you in 6 months. Continue taking your other medications as prescribed.
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
## 2013-01-30 LAB — BASIC METABOLIC PANEL WITH GFR
GFR, Est African American: >89 mL/min
GFR, Est Non African American: >89 mL/min
Potassium: 4.1 mEq/L (ref 3.5–5.3)
Sodium: 136 mEq/L (ref 135–145)

## 2013-01-30 NOTE — Progress Notes (Signed)
I saw and evaluated the patient.  I personally confirmed the key portions of the history and exam documented by Dr. Emokpae and I reviewed pertinent patient test results.  The assessment, diagnosis, and plan were formulated together and I agree with the documentation in the resident's note. 

## 2013-06-12 DIAGNOSIS — H53009 Unspecified amblyopia, unspecified eye: Secondary | ICD-10-CM | POA: Diagnosis not present

## 2013-06-12 DIAGNOSIS — Z961 Presence of intraocular lens: Secondary | ICD-10-CM | POA: Diagnosis not present

## 2013-06-12 DIAGNOSIS — H1045 Other chronic allergic conjunctivitis: Secondary | ICD-10-CM | POA: Diagnosis not present

## 2013-06-17 DIAGNOSIS — M1710 Unilateral primary osteoarthritis, unspecified knee: Secondary | ICD-10-CM | POA: Diagnosis not present

## 2013-06-17 DIAGNOSIS — M25569 Pain in unspecified knee: Secondary | ICD-10-CM | POA: Diagnosis not present

## 2013-07-16 ENCOUNTER — Other Ambulatory Visit: Payer: Self-pay | Admitting: *Deleted

## 2013-07-16 DIAGNOSIS — G4700 Insomnia, unspecified: Secondary | ICD-10-CM

## 2013-07-16 MED ORDER — ALPRAZOLAM 1 MG PO TABS: 1.0000 mg | ORAL_TABLET | Freq: Every evening | ORAL | Status: DC | PRN

## 2013-07-17 NOTE — Telephone Encounter (Signed)
Rx called in 

## 2013-07-19 ENCOUNTER — Encounter: Payer: Self-pay | Admitting: Internal Medicine

## 2013-09-02 ENCOUNTER — Other Ambulatory Visit: Payer: Self-pay | Admitting: Internal Medicine

## 2013-09-02 DIAGNOSIS — Z1231 Encounter for screening mammogram for malignant neoplasm of breast: Secondary | ICD-10-CM

## 2013-09-17 DIAGNOSIS — M1710 Unilateral primary osteoarthritis, unspecified knee: Secondary | ICD-10-CM | POA: Diagnosis not present

## 2013-10-02 ENCOUNTER — Ambulatory Visit (HOSPITAL_COMMUNITY)
Admission: RE | Admit: 2013-10-02 | Discharge: 2013-10-02 | Disposition: A | Discharge status: Home / Self Care | Payer: Medicare Other | Source: Ambulatory Visit | Attending: Internal Medicine | Admitting: Internal Medicine

## 2013-10-02 ENCOUNTER — Encounter (INDEPENDENT_AMBULATORY_CARE_PROVIDER_SITE_OTHER): Payer: Self-pay

## 2013-10-02 DIAGNOSIS — Z1231 Encounter for screening mammogram for malignant neoplasm of breast: Secondary | ICD-10-CM | POA: Insufficient documentation

## 2013-10-07 ENCOUNTER — Other Ambulatory Visit: Payer: Self-pay | Admitting: *Deleted

## 2013-10-07 DIAGNOSIS — Z0000 Encounter for general adult medical examination without abnormal findings: Secondary | ICD-10-CM

## 2013-10-10 MED ORDER — LISINOPRIL-HYDROCHLOROTHIAZIDE 20-12.5 MG PO TABS: 1.0000 | ORAL_TABLET | Freq: Every day | ORAL | Status: DC

## 2013-10-15 ENCOUNTER — Encounter: Payer: Self-pay | Admitting: Internal Medicine

## 2013-10-15 ENCOUNTER — Encounter: Payer: Medicare Other | Admitting: Internal Medicine

## 2013-10-15 ENCOUNTER — Ambulatory Visit (INDEPENDENT_AMBULATORY_CARE_PROVIDER_SITE_OTHER): Payer: Medicare Other | Admitting: Internal Medicine

## 2013-10-15 ENCOUNTER — Other Ambulatory Visit (HOSPITAL_COMMUNITY)
Admission: RE | Admit: 2013-10-15 | Discharge: 2013-10-15 | Disposition: A | Discharge status: Home / Self Care | Payer: Medicare Other | Source: Ambulatory Visit | Attending: Internal Medicine | Admitting: Internal Medicine

## 2013-10-15 VITALS — BP 112/61 | HR 63 | Temp 98.2°F | Ht 62.0 in | Wt 232.4 lb

## 2013-10-15 DIAGNOSIS — R7309 Other abnormal glucose: Secondary | ICD-10-CM

## 2013-10-15 DIAGNOSIS — I10 Essential (primary) hypertension: Secondary | ICD-10-CM

## 2013-10-15 DIAGNOSIS — K219 Gastro-esophageal reflux disease without esophagitis: Secondary | ICD-10-CM

## 2013-10-15 DIAGNOSIS — M8580 Other specified disorders of bone density and structure, unspecified site: Secondary | ICD-10-CM

## 2013-10-15 DIAGNOSIS — R7303 Prediabetes: Secondary | ICD-10-CM

## 2013-10-15 DIAGNOSIS — Z139 Encounter for screening, unspecified: Secondary | ICD-10-CM

## 2013-10-15 DIAGNOSIS — L918 Other hypertrophic disorders of the skin: Secondary | ICD-10-CM

## 2013-10-15 DIAGNOSIS — Z124 Encounter for screening for malignant neoplasm of cervix: Secondary | ICD-10-CM | POA: Diagnosis not present

## 2013-10-15 DIAGNOSIS — G4700 Insomnia, unspecified: Secondary | ICD-10-CM

## 2013-10-15 DIAGNOSIS — Z0000 Encounter for general adult medical examination without abnormal findings: Secondary | ICD-10-CM

## 2013-10-15 LAB — HEMOGLOBIN A1C
HEMOGLOBIN A1C: 6.7 % — AB (ref <5.7)
Mean Plasma Glucose: 146 mg/dL — ABNORMAL HIGH (ref <117)

## 2013-10-15 LAB — BASIC METABOLIC PANEL WITH GFR
BUN: 14 mg/dL (ref 6–23)
CHLORIDE: 103 meq/L (ref 96–112)
CO2: 23 meq/L (ref 19–32)
CREATININE: 0.63 mg/dL (ref 0.50–1.10)
Calcium: 9.2 mg/dL (ref 8.4–10.5)
GFR, Est African American: >89 mL/min
GFR, Est Non African American: >89 mL/min
Glucose, Bld: 116 mg/dL — ABNORMAL HIGH (ref 70–99)
Potassium: 4.2 mEq/L (ref 3.5–5.3)
Sodium: 138 mEq/L (ref 135–145)

## 2013-10-15 MED ORDER — ALPRAZOLAM 1 MG PO TABS: 1.0000 mg | ORAL_TABLET | Freq: Every evening | ORAL | Status: DC | PRN

## 2013-10-15 MED ORDER — LISINOPRIL-HYDROCHLOROTHIAZIDE 20-12.5 MG PO TABS: 1.0000 | ORAL_TABLET | Freq: Every day | ORAL | Status: DC

## 2013-10-15 MED ORDER — CALCIUM CARB-CHOLECALCIFEROL 600-800 MG-UNIT PO TABS: 600.0000 mg | ORAL_TABLET | Freq: Two times a day (BID) | ORAL | Status: DC

## 2013-10-15 MED ORDER — OMEPRAZOLE 20 MG PO CPDR: 20.0000 mg | DELAYED_RELEASE_CAPSULE | Freq: Every day | ORAL | Status: DC

## 2013-10-15 NOTE — Patient Instructions (Addendum)
General Instructions:   Please bring your medicines with you each time you come to clinic.  Medicines may include prescription medications, over-the-counter medications, herbal remedies, eye drops, vitamins, or other pills.   Progress Toward Treatment Goals:  Treatment Goal 09/10/2012  Blood pressure at goal    Self Care Goals & Plans:  Self Care Goal 01/29/2013  Manage my medications bring my medications to every visit; refill my medications on time  Monitor my health check my feet daily; keep track of my blood pressure  Eat healthy foods eat baked foods instead of fried foods; eat foods that are low in salt; drink diet soda or water instead of juice or soda  Be physically active take a walk every day    No flowsheet data found.

## 2013-10-15 NOTE — Assessment & Plan Note (Addendum)
Lab Results  Component Value Date   HGBA1C 6.2* 09/10/2012   HGBA1C 6.4 03/15/2012   HGBA1C 6.0 12/29/2011     Assessment: Comments: Pre- Diabetes.   Plan: Other plans: HgbA1c today- 6.7. Counselled on weight loss and Adherence to a carb modified diet.

## 2013-10-15 NOTE — Assessment & Plan Note (Signed)
BP Readings from Last 3 Encounters:  10/15/13 112/61  01/29/13 113/66  09/10/12 124/67    Lab Results  Component Value Date   NA 136 01/29/2013   K 4.1 01/29/2013   CREATININE 0.57 01/29/2013    Assessment: Blood pressure control:  Controlled Progress toward BP goal:   At goal Comments: Complaint with meds, Lisinopril- HCTZ- 20-12.5mg  daily  Plan: Medications:  continue current medications Other plans: BMP today
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
## 2013-10-15 NOTE — Assessment & Plan Note (Signed)
No epigastric pain, but pt has some cough worse at night, hacking and non productive. Likely due to regurg.   Plan- Counselled on avoiding late night meals. - Continue Omeprazole- 20mg  daily.

## 2013-10-15 NOTE — Assessment & Plan Note (Signed)
Declined Colonoscopy, would not like use the FOBT cards either. Due for a Pap smear.  Plan- Defer colonoscopy. Pap smear done today.
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
## 2013-10-16 NOTE — Progress Notes (Signed)
Case discussed with Dr. Emokpae at the time of the visit.  We reviewed the resident's history and exam and pertinent patient test results.  I agree with the assessment, diagnosis, and plan of care documented in the resident's note. 

## 2013-11-05 ENCOUNTER — Encounter: Payer: Self-pay | Admitting: Licensed Clinical Social Worker

## 2013-11-18 ENCOUNTER — Telehealth: Payer: Self-pay | Admitting: *Deleted

## 2013-11-18 NOTE — Telephone Encounter (Signed)
Call from requesting Xanax refill. Last refill was on 5/12 per Dr Denton Brick. I called [facility] -  They did not received this refill; was not called in. Verbal order given to the pharmacist.

## 2013-11-18 NOTE — Telephone Encounter (Signed)
Thank you Glenda.  Bing Neighbors, MD. PGY-1

## 2013-11-22 ENCOUNTER — Ambulatory Visit: Payer: Medicare Other | Admitting: Family Medicine

## 2013-12-16 ENCOUNTER — Ambulatory Visit (INDEPENDENT_AMBULATORY_CARE_PROVIDER_SITE_OTHER): Payer: Medicare Other | Admitting: Family Medicine

## 2013-12-16 ENCOUNTER — Encounter: Payer: Self-pay | Admitting: Family Medicine

## 2013-12-16 VITALS — BP 135/71 | Ht 63.0 in | Wt 230.0 lb

## 2013-12-16 DIAGNOSIS — M67919 Unspecified disorder of synovium and tendon, unspecified shoulder: Secondary | ICD-10-CM | POA: Diagnosis not present

## 2013-12-16 DIAGNOSIS — M25512 Pain in left shoulder: Secondary | ICD-10-CM

## 2013-12-16 DIAGNOSIS — M75101 Unspecified rotator cuff tear or rupture of right shoulder, not specified as traumatic: Secondary | ICD-10-CM | POA: Insufficient documentation

## 2013-12-16 DIAGNOSIS — M719 Bursopathy, unspecified: Secondary | ICD-10-CM

## 2013-12-16 DIAGNOSIS — M75102 Unspecified rotator cuff tear or rupture of left shoulder, not specified as traumatic: Secondary | ICD-10-CM

## 2013-12-16 DIAGNOSIS — M25519 Pain in unspecified shoulder: Secondary | ICD-10-CM | POA: Diagnosis not present

## 2013-12-16 MED ORDER — METHYLPREDNISOLONE ACETATE 40 MG/ML IJ SUSP
40.0000 mg | Freq: Once | INTRAMUSCULAR | Status: AC
  Administered 2013-12-16: 40 mg via INTRA_ARTICULAR

## 2013-12-16 MED ORDER — METHYLPREDNISOLONE ACETATE 40 MG/ML IJ SUSP: 40.0000 mg | Freq: Once | INTRAMUSCULAR | Status: DC

## 2013-12-16 NOTE — Progress Notes (Signed)
Patient ID: Lori Bradford, female   DOB: 27-Sep-1945, 68 y.o.   MRN: [id number]  Lori Bradford - 68 y.o. female MRN [id number]  Date of birth: 14-Oct-1945    SUBJECTIVE:     Left shoulder pain for the last week and a half. Similar to the pain she had in her right shoulder years ago. At the time her right shoulder was bothering her it was diagnosed as bursitis, she had a corticosteroid injection did some exercises and it has totally resolved since. Left shoulder feels very similar to that, worse with certain movements to the side and overhead. Painful at night. Was doing some yard work he really prior to this but recalls no specific injury. ROS:     No numbness or tingling in her hand. She's noted no erythema or bruising in her arm or left upper extremity. No fever, sweats, chills, unusual weight change.  PERTINENT  PMH / PSH FH / / SH:  Past Medical, Surgical, Social, and Family History Reviewed & Updated in the EMR.  Pertinent findings include:  Negative for personal history of Diabetes, however she is diagnosed with prediabetes. Obesity Osteopenia  OBJECTIVE: BP 135/71  Ht 5\' 3"  (1.6 m)  Wt 230 lb (104.327 kg)  BMI 40.75 kg/m2  Physical Exam:  Vital signs are reviewed. GENERAL: Overweight well-developed female no acute distress Shoulder: Left. Symmetrical with the right shoulder. No tenderness to palpation over the bicipital tendon the biceps muscle is nontender. Biceps muscle strength is intact. Negative speeds, negative Yergason's. Abduction and external rotation reproduce her pain. Forward flexion above 100 reproduces her pain. She does have full range of motion in all these planes and intact strength of all rotator cuff muscles however. Distally she is neurovascularly intact.  INJECTION: Patient was given informed consent, signed copy in the chart. Appropriate time out was taken. Area prepped and draped in usual sterile fashion. One cc of methylprednisolone 40 mg/ml plus  4 cc of 1%  lidocaine without epinephrine was injected into the left subacromial bursa using a(n) posterior approach. The patient tolerated the procedure well. There were no complications. Post procedure instructions were given.   ASSESSMENT & PLAN:  See problem based charting & AVS for pt instructions.

## 2013-12-16 NOTE — Assessment & Plan Note (Signed)
Left corticosteroid  shoulder injection in subacromial bursa. We gave her a new handout for home exercise program and blue Thera-Band. I urged her to do these for both shoulders several times a week chronically. Followup when necessary.

## 2014-03-14 ENCOUNTER — Other Ambulatory Visit (HOSPITAL_COMMUNITY): Payer: Self-pay | Admitting: Orthopaedic Surgery

## 2014-03-20 ENCOUNTER — Other Ambulatory Visit: Payer: Self-pay | Admitting: *Deleted

## 2014-03-20 DIAGNOSIS — G4700 Insomnia, unspecified: Secondary | ICD-10-CM

## 2014-03-21 ENCOUNTER — Encounter: Payer: Self-pay | Admitting: Internal Medicine

## 2014-03-21 ENCOUNTER — Ambulatory Visit (INDEPENDENT_AMBULATORY_CARE_PROVIDER_SITE_OTHER): Payer: Commercial Managed Care - HMO | Admitting: Internal Medicine

## 2014-03-21 VITALS — BP 123/56 | HR 70 | Temp 98.1°F | Wt 230.4 lb

## 2014-03-21 DIAGNOSIS — R7309 Other abnormal glucose: Secondary | ICD-10-CM | POA: Diagnosis not present

## 2014-03-21 DIAGNOSIS — G4700 Insomnia, unspecified: Secondary | ICD-10-CM | POA: Diagnosis not present

## 2014-03-21 DIAGNOSIS — F341 Dysthymic disorder: Secondary | ICD-10-CM

## 2014-03-21 DIAGNOSIS — R7303 Prediabetes: Secondary | ICD-10-CM

## 2014-03-21 MED ORDER — ALPRAZOLAM 1 MG PO TABS: 1.0000 mg | ORAL_TABLET | Freq: Every evening | ORAL | Status: DC | PRN

## 2014-03-21 NOTE — Telephone Encounter (Signed)
Med reorder during visit
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
## 2014-03-21 NOTE — Patient Instructions (Signed)
General Instructions:     Please bring your medicines with you each time you come to clinic.  Medicines may include prescription medications, over-the-counter medications, herbal remedies, eye drops, vitamins, or other pills.  It was nice seeing you to day. Let us know if there is something we can help you with. Wish you the very best with your surgery.

## 2014-03-23 NOTE — Assessment & Plan Note (Addendum)
Pt on xanax, 1mg  Daily PRN, pt has a medication contract signed April 2014. Pt did not tolerate prozac. Pt advised on medication dose and when to avoid taking it. Pt also counselled not to increase dose without letting us know. 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 Pt denies falls. Endorses taking med at night only PRN. Denies suicidal and homicidal ideation. Is in good spirits and looking forward to her surgery.

## 2014-03-23 NOTE — Assessment & Plan Note (Addendum)
BP Readings from Last 3 Encounters:  03/21/14 123/56  12/16/13 135/71  10/15/13 112/61    Lab Results  Component Value Date   NA 138 10/15/2013   K 4.2 10/15/2013   CREATININE 0.63 10/15/2013    Assessment: Blood pressure control:  Controlled Progress toward BP goal:   At goal Comments: Compliant with Lisinopril-HCTZ 20-12.5mg  daily.  Plan: Medications:  continue current medications Educational resources provided: brochure Self management tools provided: home blood pressure logbook Other plans: Pt has no significant cardiac or pulmonary disease ,pt is considered low to moderate risk for surgery, and may proceed with planned total knee replacement as far no significant abnormal findings on CBC, BMP and EKG. Orders for lab work already ordered on epic. Will not order to avoid repeat.

## 2014-03-23 NOTE — Assessment & Plan Note (Addendum)
Pt encouraged to increase exercise for now conservative management with diet and exercise. Pt wants to engage in more more exercises after her knee surgery. Reacess at next visit. If HgbA1c increasing consider low dose 500mg  daily of metformin.

## 2014-03-24 ENCOUNTER — Encounter (HOSPITAL_COMMUNITY): Payer: Self-pay | Admitting: Pharmacy Technician

## 2014-03-25 NOTE — Progress Notes (Signed)
Internal Medicine Clinic Attending  Case discussed with Dr. Emokpae soon after the resident saw the patient.  We reviewed the resident's history and exam and pertinent patient test results.  I agree with the assessment, diagnosis, and plan of care documented in the resident's note. 

## 2014-03-27 ENCOUNTER — Ambulatory Visit (HOSPITAL_COMMUNITY)
Admission: RE | Admit: 2014-03-27 | Discharge: 2014-03-27 | Disposition: A | Discharge status: Home / Self Care | Payer: Commercial Managed Care - HMO | Source: Ambulatory Visit | Attending: Anesthesiology | Admitting: Anesthesiology

## 2014-03-27 ENCOUNTER — Encounter (HOSPITAL_COMMUNITY)
Admission: RE | Admit: 2014-03-27 | Discharge: 2014-03-27 | Disposition: A | Discharge status: Home / Self Care | Payer: Commercial Managed Care - HMO | Source: Ambulatory Visit | Attending: Orthopaedic Surgery | Admitting: Orthopaedic Surgery

## 2014-03-27 ENCOUNTER — Encounter (HOSPITAL_COMMUNITY): Payer: Self-pay

## 2014-03-27 DIAGNOSIS — Z01818 Encounter for other preprocedural examination: Secondary | ICD-10-CM | POA: Insufficient documentation

## 2014-03-27 DIAGNOSIS — M179 Osteoarthritis of knee, unspecified: Secondary | ICD-10-CM | POA: Insufficient documentation

## 2014-03-27 DIAGNOSIS — I10 Essential (primary) hypertension: Secondary | ICD-10-CM

## 2014-03-27 HISTORY — DX: Other specified postprocedural states: Z98.890

## 2014-03-27 HISTORY — DX: Anesthesia of skin: R20.0

## 2014-03-27 HISTORY — DX: Gastro-esophageal reflux disease without esophagitis: K21.9

## 2014-03-27 HISTORY — DX: Nausea with vomiting, unspecified: R11.2

## 2014-03-27 LAB — URINALYSIS, ROUTINE W REFLEX MICROSCOPIC
BILIRUBIN URINE: NEGATIVE
Glucose, UA: NEGATIVE mg/dL
Hgb urine dipstick: NEGATIVE
Ketones, ur: NEGATIVE mg/dL
LEUKOCYTES UA: NEGATIVE
Nitrite: NEGATIVE
Protein, ur: NEGATIVE mg/dL
SPECIFIC GRAVITY, URINE: 1.005 (ref 1.005–1.030)
UROBILINOGEN UA: 0.2 mg/dL (ref 0.0–1.0)
pH: 6 (ref 5.0–8.0)

## 2014-03-27 LAB — BASIC METABOLIC PANEL
ANION GAP: 15 (ref 5–15)
BUN: 7 mg/dL (ref 6–23)
CO2: 25 mEq/L (ref 19–32)
Calcium: 9.5 mg/dL (ref 8.4–10.5)
Chloride: 97 mEq/L (ref 96–112)
Creatinine, Ser: 0.57 mg/dL (ref 0.50–1.10)
GFR calc Af Amer: >90 mL/min (ref >90)
Glucose, Bld: 152 mg/dL — ABNORMAL HIGH (ref 70–99)
POTASSIUM: 3.9 meq/L (ref 3.7–5.3)
SODIUM: 137 meq/L (ref 137–147)

## 2014-03-27 LAB — CBC
HCT: 38.9 % (ref 36.0–46.0)
Hemoglobin: 12.5 g/dL (ref 12.0–15.0)
MCH: 29.1 pg (ref 26.0–34.0)
MCHC: 32.1 g/dL (ref 30.0–36.0)
MCV: 90.7 fL (ref 78.0–100.0)
PLATELETS: 335 10*3/uL (ref 150–400)
RBC: 4.29 MIL/uL (ref 3.87–5.11)
RDW: 13.4 % (ref 11.5–15.5)
WBC: 7.5 10*3/uL (ref 4.0–10.5)

## 2014-03-27 LAB — PROTIME-INR
INR: 1.04 (ref 0.00–1.49)
Prothrombin Time: 13.7 seconds (ref 11.6–15.2)

## 2014-03-27 LAB — SURGICAL PCR SCREEN
MRSA, PCR: NEGATIVE
Staphylococcus aureus: NEGATIVE

## 2014-03-27 LAB — APTT: APTT: 32 s (ref 24–37)

## 2014-03-27 NOTE — Pre-Procedure Instructions (Signed)
EKG AND CXR WERE DONE TODAY - PREOP AT [facility]. 

## 2014-03-27 NOTE — Progress Notes (Signed)
03/27/14 1029  OBSTRUCTIVE SLEEP APNEA  Have you ever been diagnosed with sleep apnea through a sleep study? No  Do you snore loudly (loud enough to be heard through closed doors)?  0  Do you often feel tired, fatigued, or sleepy during the daytime? 0  Has anyone observed you stop breathing during your sleep? 0  Do you have, or are you being treated for high blood pressure? 1  BMI more than 35 kg/m2? 1  Age over 67 years old? 1  Neck circumference greater than 40 cm/16 inches? 1  Gender: 0  Obstructive Sleep Apnea Score 4  Score 4 or greater  Results sent to PCP

## 2014-03-27 NOTE — Patient Instructions (Addendum)
YOUR SURGERY IS SCHEDULED AT [facility]  ON:  Friday  10/30  REPORT TO  SHORT STAY CENTER AT:  5:30 AM   DO NOT EAT OR DRINK ANYTHING AFTER MIDNIGHT THE NIGHT BEFORE YOUR SURGERY.  YOU MAY BRUSH YOUR TEETH, RINSE OUT YOUR MOUTH--BUT NO WATER, NO FOOD, NO CHEWING GUM, NO MINTS, NO CANDIES, NO CHEWING TOBACCO.   PLEASE TAKE THE FOLLOWING MEDICATIONS THE AM OF YOUR SURGERY WITH A FEW SIPS OF WATER:   OMEPRAZOLE ( PRILOSEC )     DO NOT BRING VALUABLES, MONEY, CREDIT CARDS.  DO NOT WEAR JEWELRY, MAKE-UP, NAIL POLISH AND NO METAL PINS OR CLIPS IN YOUR HAIR. CONTACT LENS, DENTURES / PARTIALS, GLASSES SHOULD NOT BE WORN TO SURGERY AND IN MOST CASES-HEARING AIDS WILL NEED TO BE REMOVED.  BRING YOUR GLASSES CASE, ANY EQUIPMENT NEEDED FOR YOUR CONTACT LENS. FOR PATIENTS ADMITTED TO THE HOSPITAL--CHECK OUT TIME THE DAY OF DISCHARGE IS 11:00 AM.  ALL INPATIENT ROOMS ARE PRIVATE - WITH BATHROOM, TELEPHONE, TELEVISION AND WIFI INTERNET.    PLEASE BE AWARE THAT YOU MAY NEED ADDITIONAL BLOOD DRAWN DAY OF YOUR SURGERY  _______________________________________________________________________   [facility] - Preparing for Surgery Before surgery, you can play an important role.  Because skin is not sterile, your skin needs to be as free of germs as possible.  You can reduce the number of germs on your skin by washing with CHG (chlorahexidine gluconate) soap before surgery.  CHG is an antiseptic cleaner which kills germs and bonds with the skin to continue killing germs even after washing. Please DO NOT use if you have an allergy to CHG or antibacterial soaps.  If your skin becomes reddened/irritated stop using the CHG and inform your nurse when you arrive at Short Stay. Do not shave (including legs and underarms) for at least 48 hours prior to the first CHG shower.  You may shave your face/neck. Please follow these instructions carefully:  1.  Shower with CHG Soap the night before surgery  and the  morning of Surgery.  2.  If you choose to wash your hair, wash your hair first as usual with your  normal  shampoo.  3.  After you shampoo, rinse your hair and body thoroughly to remove the  shampoo.                           4.  Use CHG as you would any other liquid soap.  You can apply chg directly  to the skin and wash                       Gently with a scrungie or clean washcloth.  5.  Apply the CHG Soap to your body ONLY FROM THE NECK DOWN.   Do not use on face/ open                           Wound or open sores. Avoid contact with eyes, ears mouth and genitals (private parts).                       Wash face,  Genitals (private parts) with your normal soap.             6.  Wash thoroughly, paying special attention to the area where your surgery  will be performed.  7.  Thoroughly rinse  your body with warm water from the neck down.  8.  DO NOT shower/wash with your normal soap after using and rinsing off  the CHG Soap.                9.  Pat yourself dry with a clean towel.            10.  Wear clean pajamas.            11.  Place clean sheets on your bed the night of your first shower and do not  sleep with pets. Day of Surgery : Do not apply any lotions/deodorants the morning of surgery.  Please wear clean clothes to the hospital/surgery center.  FAILURE TO FOLLOW THESE INSTRUCTIONS MAY RESULT IN THE CANCELLATION OF YOUR SURGERY PATIENT SIGNATURE_________________________________  NURSE SIGNATURE__________________________________  ________________________________________________________________________  WHAT IS A BLOOD TRANSFUSION? Blood Transfusion Information  A transfusion is the replacement of blood or some of its parts. Blood is made up of multiple cells which provide different functions.  Red blood cells carry oxygen and are used for blood loss replacement.  White blood cells fight against infection.  Platelets control bleeding.  Plasma helps clot  blood.  Other blood products are available for specialized needs, such as hemophilia or other clotting disorders. BEFORE THE TRANSFUSION  Who gives blood for transfusions?   Healthy volunteers who are fully evaluated to make sure their blood is safe. This is blood bank blood. Transfusion therapy is the safest it has ever been in the practice of medicine. Before blood is taken from a donor, a complete history is taken to make sure that person has no history of diseases nor engages in risky social behavior (examples are intravenous drug use or sexual activity with multiple partners). The donor's travel history is screened to minimize risk of transmitting infections, such as malaria. The donated blood is tested for signs of infectious diseases, such as HIV and hepatitis. The blood is then tested to be sure it is compatible with you in order to minimize the chance of a transfusion reaction. If you or a relative donates blood, this is often done in anticipation of surgery and is not appropriate for emergency situations. It takes many days to process the donated blood. RISKS AND COMPLICATIONS Although transfusion therapy is very safe and saves many lives, the main dangers of transfusion include:   Getting an infectious disease.  Developing a transfusion reaction. This is an allergic reaction to something in the blood you were given. Every precaution is taken to prevent this. The decision to have a blood transfusion has been considered carefully by your caregiver before blood is given. Blood is not given unless the benefits outweigh the risks. AFTER THE TRANSFUSION  Right after receiving a blood transfusion, you will usually feel much better and more energetic. This is especially true if your red blood cells have gotten low (anemic). The transfusion raises the level of the red blood cells which carry oxygen, and this usually causes an energy increase.  The nurse administering the transfusion will monitor  you carefully for complications. HOME CARE INSTRUCTIONS  No special instructions are needed after a transfusion. You may find your energy is better. Speak with your caregiver about any limitations on activity for underlying diseases you may have. SEEK MEDICAL CARE IF:   Your condition is not improving after your transfusion.  You develop redness or irritation at the intravenous (IV) site. SEEK IMMEDIATE MEDICAL CARE IF:  Any of the following  symptoms occur over the next 12 hours:  Shaking chills.  You have a temperature by mouth above 102 F (38.9 C), not controlled by medicine.  Chest, back, or muscle pain.  People around you feel you are not acting correctly or are confused.  Shortness of breath or difficulty breathing.  Dizziness and fainting.  You get a rash or develop hives.  You have a decrease in urine output.  Your urine turns a dark color or changes to pink, red, or brown. Any of the following symptoms occur over the next 10 days:  You have a temperature by mouth above 102 F (38.9 C), not controlled by medicine.  Shortness of breath.  Weakness after normal activity.  The white part of the eye turns yellow (jaundice).  You have a decrease in the amount of urine or are urinating less often.  Your urine turns a dark color or changes to pink, red, or brown. Document Released: 05/20/2000 Document Revised: 08/15/2011 Document Reviewed: 01/07/2008 [facility] Patient Information 2014 [place], [place].  _______________________________________________________________________

## 2014-03-28 ENCOUNTER — Telehealth: Payer: Self-pay | Admitting: Internal Medicine

## 2014-03-28 NOTE — Telephone Encounter (Signed)
Pt presented for pre-admission testing and screened positive for OSA with the sleep bang tool. At follow up visit will re-evaluate for OSA and consider dedicated sleep study/polysomnography.  Bing Neighbors, MD. PGY-2 IMTS.

## 2014-04-03 NOTE — Anesthesia Preprocedure Evaluation (Signed)
Anesthesia Evaluation  Patient identified by MRN, date of birth, ID band Patient awake    Reviewed: Allergy & Precautions, H&P , NPO status , Patient's Chart, lab work & pertinent test results  History of Anesthesia Complications (+) PONV and history of anesthetic complications  Airway Mallampati: II  TM Distance: >3 FB Neck ROM: Full    Dental no notable dental hx.    Pulmonary neg pulmonary ROS,  breath sounds clear to auscultation  Pulmonary exam normal       Cardiovascular hypertension, Pt. on medications + dysrhythmias Rhythm:Regular Rate:Normal     Neuro/Psych PSYCHIATRIC DISORDERS Anxiety Depression negative neurological ROS     GI/Hepatic Neg liver ROS, hiatal hernia, GERD-  Medicated and Controlled,  Endo/Other  negative endocrine ROSMorbid obesity  Renal/GU negative Renal ROS  negative genitourinary   Musculoskeletal  (+) Arthritis -, Osteoarthritis,    Abdominal   Peds negative pediatric ROS (+)  Hematology negative hematology ROS (+)   Anesthesia Other Findings   Reproductive/Obstetrics negative OB ROS                             Anesthesia Physical Anesthesia Plan  ASA: III  Anesthesia Plan: General and Spinal   Post-op Pain Management:    Induction: Intravenous  Airway Management Planned: Oral ETT and Nasal Cannula  Additional Equipment:   Intra-op Plan:   Post-operative Plan: Extubation in OR  Informed Consent: I have reviewed the patients History and Physical, chart, labs and discussed the procedure including the risks, benefits and alternatives for the proposed anesthesia with the patient or authorized representative who has indicated his/her understanding and acceptance.   Dental advisory given  Plan Discussed with: CRNA  Anesthesia Plan Comments:         Anesthesia Quick Evaluation

## 2014-04-04 ENCOUNTER — Inpatient Hospital Stay (HOSPITAL_COMMUNITY)
Admission: RE | Admit: 2014-04-04 | Discharge: 2014-04-06 | DRG: 470 | Disposition: A | Discharge status: Home Health | Payer: Medicare HMO | Source: Ambulatory Visit | Attending: Orthopaedic Surgery | Admitting: Orthopaedic Surgery

## 2014-04-04 ENCOUNTER — Encounter (HOSPITAL_COMMUNITY): Payer: Self-pay | Admitting: *Deleted

## 2014-04-04 ENCOUNTER — Encounter (HOSPITAL_COMMUNITY)
Admission: RE | Disposition: A | Discharge status: Home Health | Payer: Self-pay | Source: Ambulatory Visit | Attending: Orthopaedic Surgery

## 2014-04-04 ENCOUNTER — Inpatient Hospital Stay (HOSPITAL_COMMUNITY): Payer: Medicare HMO

## 2014-04-04 ENCOUNTER — Inpatient Hospital Stay (HOSPITAL_COMMUNITY): Payer: Medicare HMO | Admitting: Anesthesiology

## 2014-04-04 ENCOUNTER — Encounter (HOSPITAL_COMMUNITY): Payer: Medicare HMO | Admitting: Anesthesiology

## 2014-04-04 DIAGNOSIS — Z6841 Body Mass Index (BMI) 40.0 and over, adult: Secondary | ICD-10-CM | POA: Diagnosis not present

## 2014-04-04 DIAGNOSIS — M25561 Pain in right knee: Secondary | ICD-10-CM | POA: Diagnosis present

## 2014-04-04 DIAGNOSIS — I10 Essential (primary) hypertension: Secondary | ICD-10-CM | POA: Diagnosis present

## 2014-04-04 DIAGNOSIS — Z96659 Presence of unspecified artificial knee joint: Secondary | ICD-10-CM

## 2014-04-04 DIAGNOSIS — Z791 Long term (current) use of non-steroidal anti-inflammatories (NSAID): Secondary | ICD-10-CM

## 2014-04-04 DIAGNOSIS — M1711 Unilateral primary osteoarthritis, right knee: Principal | ICD-10-CM

## 2014-04-04 DIAGNOSIS — E669 Obesity, unspecified: Secondary | ICD-10-CM | POA: Diagnosis present

## 2014-04-04 DIAGNOSIS — Z96652 Presence of left artificial knee joint: Secondary | ICD-10-CM | POA: Diagnosis present

## 2014-04-04 DIAGNOSIS — Z96653 Presence of artificial knee joint, bilateral: Secondary | ICD-10-CM

## 2014-04-04 DIAGNOSIS — M8580 Other specified disorders of bone density and structure, unspecified site: Secondary | ICD-10-CM | POA: Diagnosis present

## 2014-04-04 DIAGNOSIS — K219 Gastro-esophageal reflux disease without esophagitis: Secondary | ICD-10-CM | POA: Diagnosis present

## 2014-04-04 HISTORY — PX: TOTAL KNEE ARTHROPLASTY: SHX125

## 2014-04-04 LAB — TYPE AND SCREEN
ABO/RH(D): A POS
Antibody Screen: NEGATIVE

## 2014-04-04 SURGERY — ARTHROPLASTY, KNEE, TOTAL
Anesthesia: General | Site: Knee | Laterality: Right

## 2014-04-04 MED ORDER — DEXAMETHASONE SODIUM PHOSPHATE 10 MG/ML IJ SOLN
INTRAMUSCULAR | Status: AC
  Filled 2014-04-04: qty 1

## 2014-04-04 MED ORDER — ONDANSETRON HCL 4 MG/2ML IJ SOLN: 4.0000 mg | Freq: Four times a day (QID) | INTRAMUSCULAR | Status: DC | PRN

## 2014-04-04 MED ORDER — TRANEXAMIC ACID 100 MG/ML IV SOLN
1000.0000 mg | INTRAVENOUS | Status: AC
  Administered 2014-04-04: 1000 mg via INTRAVENOUS
  Filled 2014-04-04: qty 10

## 2014-04-04 MED ORDER — CLINDAMYCIN PHOSPHATE 900 MG/50ML IV SOLN
900.0000 mg | INTRAVENOUS | Status: AC
  Administered 2014-04-04: 900 mg via INTRAVENOUS

## 2014-04-04 MED ORDER — METHOCARBAMOL 1000 MG/10ML IJ SOLN
500.0000 mg | Freq: Four times a day (QID) | INTRAMUSCULAR | Status: DC | PRN
  Administered 2014-04-04: 500 mg via INTRAVENOUS
  Filled 2014-04-04: qty 5

## 2014-04-04 MED ORDER — POLYETHYLENE GLYCOL 3350 17 G PO PACK: 17.0000 g | PACK | Freq: Every day | ORAL | Status: DC | PRN

## 2014-04-04 MED ORDER — ONDANSETRON HCL 4 MG/2ML IJ SOLN
INTRAMUSCULAR | Status: AC
  Filled 2014-04-04: qty 2

## 2014-04-04 MED ORDER — ONDANSETRON HCL 4 MG PO TABS: 4.0000 mg | ORAL_TABLET | Freq: Four times a day (QID) | ORAL | Status: DC | PRN

## 2014-04-04 MED ORDER — DIPHENHYDRAMINE HCL 12.5 MG/5ML PO ELIX: 12.5000 mg | ORAL_SOLUTION | ORAL | Status: DC | PRN

## 2014-04-04 MED ORDER — ONDANSETRON HCL 4 MG/2ML IJ SOLN: 4.0000 mg | Freq: Once | INTRAMUSCULAR | Status: DC | PRN

## 2014-04-04 MED ORDER — ACETAMINOPHEN 325 MG PO TABS: 650.0000 mg | ORAL_TABLET | Freq: Four times a day (QID) | ORAL | Status: DC | PRN

## 2014-04-04 MED ORDER — LISINOPRIL-HYDROCHLOROTHIAZIDE 20-12.5 MG PO TABS: 1.0000 | ORAL_TABLET | ORAL | Status: DC

## 2014-04-04 MED ORDER — METOCLOPRAMIDE HCL 10 MG PO TABS: 5.0000 mg | ORAL_TABLET | Freq: Three times a day (TID) | ORAL | Status: DC | PRN

## 2014-04-04 MED ORDER — HYDROMORPHONE HCL 1 MG/ML IJ SOLN: 1.0000 mg | INTRAMUSCULAR | Status: DC | PRN

## 2014-04-04 MED ORDER — CLINDAMYCIN PHOSPHATE 900 MG/50ML IV SOLN
INTRAVENOUS | Status: AC
  Filled 2014-04-04: qty 50

## 2014-04-04 MED ORDER — FENTANYL CITRATE 0.05 MG/ML IJ SOLN: 25.0000 ug | INTRAMUSCULAR | Status: DC | PRN

## 2014-04-04 MED ORDER — LISINOPRIL 20 MG PO TABS
20.0000 mg | ORAL_TABLET | Freq: Every day | ORAL | Status: DC
  Administered 2014-04-05: 20 mg via ORAL
  Filled 2014-04-04 (×3): qty 1

## 2014-04-04 MED ORDER — LIDOCAINE HCL (CARDIAC) 20 MG/ML IV SOLN
INTRAVENOUS | Status: AC
  Filled 2014-04-04: qty 5

## 2014-04-04 MED ORDER — ALPRAZOLAM 1 MG PO TABS
1.0000 mg | ORAL_TABLET | Freq: Every day | ORAL | Status: DC
  Administered 2014-04-04 – 2014-04-05 (×2): 1 mg via ORAL
  Filled 2014-04-04 (×2): qty 1

## 2014-04-04 MED ORDER — PROPOFOL 10 MG/ML IV BOLUS
INTRAVENOUS | Status: AC
  Filled 2014-04-04: qty 20

## 2014-04-04 MED ORDER — MENTHOL 3 MG MT LOZG
1.0000 | LOZENGE | OROMUCOSAL | Status: DC | PRN
  Filled 2014-04-04: qty 9

## 2014-04-04 MED ORDER — SODIUM CHLORIDE 0.9 % IV SOLN
INTRAVENOUS | Status: DC
  Administered 2014-04-04: 75 mL/h via INTRAVENOUS
  Administered 2014-04-05: 03:00:00 via INTRAVENOUS

## 2014-04-04 MED ORDER — ROCURONIUM BROMIDE 100 MG/10ML IV SOLN
INTRAVENOUS | Status: DC | PRN
  Administered 2014-04-04: 35 mg via INTRAVENOUS

## 2014-04-04 MED ORDER — HYDROCHLOROTHIAZIDE 12.5 MG PO CAPS
12.5000 mg | ORAL_CAPSULE | Freq: Every day | ORAL | Status: DC
Start: 2014-04-04 — End: 2014-04-06
  Administered 2014-04-05 – 2014-04-06 (×2): 12.5 mg via ORAL
  Filled 2014-04-04 (×3): qty 1

## 2014-04-04 MED ORDER — ALUM & MAG HYDROXIDE-SIMETH 200-200-20 MG/5ML PO SUSP: 30.0000 mL | ORAL | Status: DC | PRN

## 2014-04-04 MED ORDER — HYDROMORPHONE HCL 1 MG/ML IJ SOLN
INTRAMUSCULAR | Status: AC
  Filled 2014-04-04: qty 1

## 2014-04-04 MED ORDER — FENTANYL CITRATE 0.05 MG/ML IJ SOLN
INTRAMUSCULAR | Status: DC | PRN
  Administered 2014-04-04 (×2): 50 ug via INTRAVENOUS
  Administered 2014-04-04: 100 ug via INTRAVENOUS
  Administered 2014-04-04: 50 ug via INTRAVENOUS

## 2014-04-04 MED ORDER — MIDAZOLAM HCL 5 MG/5ML IJ SOLN
INTRAMUSCULAR | Status: DC | PRN
  Administered 2014-04-04: 2 mg via INTRAVENOUS

## 2014-04-04 MED ORDER — EPHEDRINE SULFATE 50 MG/ML IJ SOLN
INTRAMUSCULAR | Status: AC
  Filled 2014-04-04: qty 1

## 2014-04-04 MED ORDER — FENTANYL CITRATE 0.05 MG/ML IJ SOLN
INTRAMUSCULAR | Status: AC
  Filled 2014-04-04: qty 5

## 2014-04-04 MED ORDER — MIDAZOLAM HCL 2 MG/2ML IJ SOLN
INTRAMUSCULAR | Status: AC
  Filled 2014-04-04: qty 2

## 2014-04-04 MED ORDER — LACTATED RINGERS IV SOLN: INTRAVENOUS | Status: DC

## 2014-04-04 MED ORDER — METHOCARBAMOL 500 MG PO TABS
500.0000 mg | ORAL_TABLET | Freq: Four times a day (QID) | ORAL | Status: DC | PRN
  Administered 2014-04-04 – 2014-04-06 (×5): 500 mg via ORAL
  Filled 2014-04-04 (×5): qty 1

## 2014-04-04 MED ORDER — HYDROMORPHONE HCL 2 MG/ML IJ SOLN
INTRAMUSCULAR | Status: AC
  Filled 2014-04-04: qty 1

## 2014-04-04 MED ORDER — SODIUM CHLORIDE 0.9 % IJ SOLN
INTRAMUSCULAR | Status: DC | PRN
  Administered 2014-04-04: 40 mL

## 2014-04-04 MED ORDER — ACETAMINOPHEN 650 MG RE SUPP: 650.0000 mg | Freq: Four times a day (QID) | RECTAL | Status: DC | PRN

## 2014-04-04 MED ORDER — METOCLOPRAMIDE HCL 5 MG/ML IJ SOLN: 5.0000 mg | Freq: Three times a day (TID) | INTRAMUSCULAR | Status: DC | PRN

## 2014-04-04 MED ORDER — LIDOCAINE HCL (CARDIAC) 20 MG/ML IV SOLN
INTRAVENOUS | Status: DC | PRN
  Administered 2014-04-04: 50 mg via INTRAVENOUS

## 2014-04-04 MED ORDER — ONDANSETRON HCL 4 MG/2ML IJ SOLN
INTRAMUSCULAR | Status: DC | PRN
  Administered 2014-04-04: 4 mg via INTRAVENOUS

## 2014-04-04 MED ORDER — DOCUSATE SODIUM 100 MG PO CAPS
100.0000 mg | ORAL_CAPSULE | Freq: Two times a day (BID) | ORAL | Status: DC
  Administered 2014-04-04 – 2014-04-06 (×3): 100 mg via ORAL

## 2014-04-04 MED ORDER — OXYCODONE HCL 5 MG PO TABS
5.0000 mg | ORAL_TABLET | ORAL | Status: DC | PRN
  Administered 2014-04-04 (×4): 5 mg via ORAL
  Administered 2014-04-05 – 2014-04-06 (×5): 10 mg via ORAL
  Filled 2014-04-04 (×3): qty 1
  Filled 2014-04-04: qty 2
  Filled 2014-04-04: qty 1
  Filled 2014-04-04 (×4): qty 2

## 2014-04-04 MED ORDER — SODIUM CHLORIDE 0.9 % IJ SOLN
INTRAMUSCULAR | Status: AC
  Filled 2014-04-04: qty 10

## 2014-04-04 MED ORDER — DEXAMETHASONE SODIUM PHOSPHATE 10 MG/ML IJ SOLN
INTRAMUSCULAR | Status: DC | PRN
  Administered 2014-04-04: 10 mg via INTRAVENOUS

## 2014-04-04 MED ORDER — SODIUM CHLORIDE 0.9 % IR SOLN
Status: DC | PRN
  Administered 2014-04-04: 2000 mL

## 2014-04-04 MED ORDER — RIVAROXABAN 10 MG PO TABS
10.0000 mg | ORAL_TABLET | Freq: Every day | ORAL | Status: DC
  Administered 2014-04-05 – 2014-04-06 (×2): 10 mg via ORAL
  Filled 2014-04-04 (×3): qty 1

## 2014-04-04 MED ORDER — LACTATED RINGERS IV SOLN
INTRAVENOUS | Status: DC | PRN
  Administered 2014-04-04 (×3): via INTRAVENOUS

## 2014-04-04 MED ORDER — ROCURONIUM BROMIDE 100 MG/10ML IV SOLN
INTRAVENOUS | Status: AC
  Filled 2014-04-04: qty 1

## 2014-04-04 MED ORDER — CLINDAMYCIN PHOSPHATE 600 MG/50ML IV SOLN
600.0000 mg | Freq: Four times a day (QID) | INTRAVENOUS | Status: AC
  Administered 2014-04-04 (×2): 600 mg via INTRAVENOUS
  Filled 2014-04-04 (×2): qty 50

## 2014-04-04 MED ORDER — SODIUM CHLORIDE 0.9 % IJ SOLN
INTRAMUSCULAR | Status: AC
  Filled 2014-04-04: qty 50

## 2014-04-04 MED ORDER — 0.9 % SODIUM CHLORIDE (POUR BTL) OPTIME
TOPICAL | Status: DC | PRN
  Administered 2014-04-04: 1000 mL

## 2014-04-04 MED ORDER — PHENOL 1.4 % MT LIQD
1.0000 | OROMUCOSAL | Status: DC | PRN
  Filled 2014-04-04: qty 177

## 2014-04-04 MED ORDER — BUPIVACAINE LIPOSOME 1.3 % IJ SUSP
20.0000 mL | Freq: Once | INTRAMUSCULAR | Status: AC
  Administered 2014-04-04: 20 mL
  Filled 2014-04-04: qty 20

## 2014-04-04 MED ORDER — HYDROMORPHONE HCL 1 MG/ML IJ SOLN
0.2500 mg | INTRAMUSCULAR | Status: DC | PRN
  Administered 2014-04-04 (×2): 0.5 mg via INTRAVENOUS

## 2014-04-04 MED ORDER — PANTOPRAZOLE SODIUM 40 MG PO TBEC
40.0000 mg | DELAYED_RELEASE_TABLET | Freq: Every day | ORAL | Status: DC
  Administered 2014-04-05 – 2014-04-06 (×2): 40 mg via ORAL
  Filled 2014-04-04 (×2): qty 1

## 2014-04-04 SURGICAL SUPPLY — 67 items
APL SKNCLS STERI-STRIP NONHPOA (GAUZE/BANDAGES/DRESSINGS) ×1
BAG SPEC THK2 15X12 ZIP CLS (MISCELLANEOUS) ×1
BAG ZIPLOCK 12X15 (MISCELLANEOUS) ×2 IMPLANT
BANDAGE ELASTIC 6 VELCRO ST LF (GAUZE/BANDAGES/DRESSINGS) ×3 IMPLANT
BANDAGE ESMARK 6X9 LF (GAUZE/BANDAGES/DRESSINGS) ×1 IMPLANT
BENZOIN TINCTURE PRP APPL 2/3 (GAUZE/BANDAGES/DRESSINGS) ×3 IMPLANT
BLADE SAG 18X100X1.27 (BLADE) ×3 IMPLANT
BNDG CMPR 9X6 STRL LF SNTH (GAUZE/BANDAGES/DRESSINGS) ×1
BNDG ESMARK 6X9 LF (GAUZE/BANDAGES/DRESSINGS) ×3
BOWL SMART MIX CTS (DISPOSABLE) ×3 IMPLANT
CEMENT BONE 1-PACK (Cement) ×6 IMPLANT
CLOSURE WOUND 1/2 X4 (GAUZE/BANDAGES/DRESSINGS) ×2
CUFF TOURN SGL QUICK 34 (TOURNIQUET CUFF) ×3
CUFF TRNQT CYL 34X4X40X1 (TOURNIQUET CUFF) ×1 IMPLANT
DRAPE EXTREMITY TIBURON (DRAPES) ×3 IMPLANT
DRAPE POUCH INSTRU U-SHP 10X18 (DRAPES) ×3 IMPLANT
DRAPE SHEET LG 3/4 BI-LAMINATE (DRAPES) ×2 IMPLANT
DRAPE U-SHAPE 47X51 STRL (DRAPES) ×3 IMPLANT
DRSG PAD ABDOMINAL 8X10 ST (GAUZE/BANDAGES/DRESSINGS) ×3 IMPLANT
DURAPREP 26ML APPLICATOR (WOUND CARE) ×3 IMPLANT
ELECT REM PT RETURN 9FT ADLT (ELECTROSURGICAL) ×3
ELECTRODE REM PT RTRN 9FT ADLT (ELECTROSURGICAL) ×1 IMPLANT
EVACUATOR 1/8 PVC DRAIN (DRAIN) IMPLANT
FACESHIELD WRAPAROUND (MASK) ×12 IMPLANT
FACESHIELD WRAPAROUND OR TEAM (MASK) ×5 IMPLANT
GAUZE SPONGE 4X4 12PLY STRL (GAUZE/BANDAGES/DRESSINGS) ×3 IMPLANT
GLOVE BIO SURGEON STRL SZ7 (GLOVE) ×2 IMPLANT
GLOVE BIO SURGEON STRL SZ7.5 (GLOVE) ×6 IMPLANT
GLOVE BIOGEL PI IND STRL 6.5 (GLOVE) ×1 IMPLANT
GLOVE BIOGEL PI IND STRL 7.0 (GLOVE) ×1 IMPLANT
GLOVE BIOGEL PI IND STRL 8 (GLOVE) ×2 IMPLANT
GLOVE BIOGEL PI INDICATOR 6.5 (GLOVE) ×2
GLOVE BIOGEL PI INDICATOR 7.0 (GLOVE) ×2
GLOVE BIOGEL PI INDICATOR 8 (GLOVE) ×6
GLOVE ECLIPSE 7.5 STRL STRAW (GLOVE) ×2 IMPLANT
GLOVE ECLIPSE 8.0 STRL XLNG CF (GLOVE) ×3 IMPLANT
GOWN SPEC L3 XXLG W/TWL (GOWN DISPOSABLE) ×3 IMPLANT
GOWN STRL REUS W/TWL XL LVL3 (GOWN DISPOSABLE) ×8 IMPLANT
HANDPIECE INTERPULSE COAX TIP (DISPOSABLE) ×3
IMMOBILIZER KNEE 20 (SOFTGOODS) ×3
IMMOBILIZER KNEE 20 THIGH 36 (SOFTGOODS) ×1 IMPLANT
KIT BASIN OR (CUSTOM PROCEDURE TRAY) ×3 IMPLANT
KNEE/VIT E POLY LINER LEVEL 1B ×2 IMPLANT
NDL SAFETY ECLIPSE 18X1.5 (NEEDLE) ×1 IMPLANT
NEEDLE HYPO 18GX1.5 SHARP (NEEDLE) ×3
NS IRRIG 1000ML POUR BTL (IV SOLUTION) ×3 IMPLANT
PACK TOTAL JOINT (CUSTOM PROCEDURE TRAY) ×3 IMPLANT
PADDING CAST COTTON 6X4 STRL (CAST SUPPLIES) ×4 IMPLANT
POSITIONER SURGICAL ARM (MISCELLANEOUS) ×3 IMPLANT
SET HNDPC FAN SPRY TIP SCT (DISPOSABLE) ×1 IMPLANT
SET PAD KNEE POSITIONER (MISCELLANEOUS) ×3 IMPLANT
SPONGE LAP 18X18 X RAY DECT (DISPOSABLE) ×3 IMPLANT
STRIP CLOSURE SKIN 1/2X4 (GAUZE/BANDAGES/DRESSINGS) ×2 IMPLANT
SUCTION FRAZIER 12FR DISP (SUCTIONS) ×3 IMPLANT
SUT MNCRL AB 4-0 PS2 18 (SUTURE) ×2 IMPLANT
SUT VIC AB 0 CT1 27 (SUTURE) ×3
SUT VIC AB 0 CT1 27XBRD ANTBC (SUTURE) ×1 IMPLANT
SUT VIC AB 1 CT1 27 (SUTURE) ×9
SUT VIC AB 1 CT1 27XBRD ANTBC (SUTURE) ×1 IMPLANT
SUT VIC AB 2-0 CT1 27 (SUTURE) ×6
SUT VIC AB 2-0 CT1 TAPERPNT 27 (SUTURE) ×2 IMPLANT
SYR 50ML LL SCALE MARK (SYRINGE) ×2 IMPLANT
TOWEL OR 17X26 10 PK STRL BLUE (TOWEL DISPOSABLE) ×3 IMPLANT
TOWEL OR NON WOVEN STRL DISP B (DISPOSABLE) ×3 IMPLANT
TRAY FOLEY CATH 14FRSI W/METER (CATHETERS) ×3 IMPLANT
WATER STERILE IRR 1500ML POUR (IV SOLUTION) ×3 IMPLANT
WRAP KNEE MAXI GEL POST OP (GAUZE/BANDAGES/DRESSINGS) ×3 IMPLANT
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
## 2014-04-04 NOTE — Progress Notes (Signed)
Utilization review completed.  

## 2014-04-04 NOTE — Evaluation (Signed)
Physical Therapy Evaluation Patient Details Name: Lori Bradford MRN: [id number] DOB: [birth year] Today's Date: 04/04/2014   History of Present Illness     Clinical Impression  Pt s/p R TKR presents with decreased R LE strength/ROM and post op pain limiting functional mobility.  Pt should progress to d/c home with family assist and HHPT follow up.    Follow Up Recommendations Home health PT    Equipment Recommendations  None recommended by PT    Recommendations for Other Services OT consult     Precautions / Restrictions Precautions Precautions: Fall;Knee Required Braces or Orthoses: Knee Immobilizer - Right Knee Immobilizer - Right: Discontinue once straight leg raise with < 10 degree lag Restrictions Weight Bearing Restrictions: No Other Position/Activity Restrictions: WBAT      Mobility  Bed Mobility Overal bed mobility: Needs Assistance Bed Mobility: Supine to Sit;Sit to Supine     Supine to sit: Mod assist Sit to supine: Mod assist   General bed mobility comments: cues for sequence and use of L LE to self assist  Transfers Overall transfer level: Modified independent Equipment used: Rolling walker (2 wheeled)             General transfer comment: cues for LE management and use of UEs to self assist  Ambulation/Gait Ambulation/Gait assistance: Mod assist Ambulation Distance (Feet): 12 Feet Assistive device: Rolling walker (2 wheeled) Gait Pattern/deviations: Step-to pattern;Decreased step length - right;Decreased step length - left;Shuffle;Antalgic;Trunk flexed Gait velocity: decr   General Gait Details: cues for sequence, posture and position from [employer] Mobility    Modified Rankin (Stroke Patients Only)       Balance                                             Pertinent Vitals/Pain Pain Assessment: 0-10 Pain Score: 5  Pain Location: R knee Pain Descriptors / Indicators:  Aching;Sore Pain Intervention(s): Limited activity within patient's tolerance;Monitored during session;Premedicated before session;Ice applied    Home Living Family/patient expects to be discharged to:: Private residence Living Arrangements: Spouse/significant other;Children Available Help at Discharge: Family Type of Home: Mobile home Home Access: Stairs to enter Entrance Stairs-Rails: Right;Left;Can reach both Entrance Stairs-Number of Steps: 2 Home Layout: One level Home Equipment: Environmental consultant - 2 wheels      Prior Function Level of Independence: Independent               Hand Dominance   Dominant Hand: Right    Extremity/Trunk Assessment   Upper Extremity Assessment: Overall WFL for tasks assessed           Lower Extremity Assessment: RLE deficits/detail RLE Deficits / Details: 2/5 quads with AAROM at knee -10 - 45    Cervical / Trunk Assessment: Normal  Communication   Communication: No difficulties  Cognition Arousal/Alertness: Awake/alert Behavior During Therapy: WFL for tasks assessed/performed Overall Cognitive Status: Within Functional Limits for tasks assessed                      General Comments      Exercises Total Joint Exercises Ankle Circles/Pumps: AROM;10 reps;Supine;Both Quad Sets: AROM;Both;10 reps;Supine Heel Slides: AAROM;10 reps;Supine;Right Straight Leg Raises: AAROM;Right;10 reps;Supine      Assessment/Plan    PT Assessment Patient needs continued PT services  PT  Diagnosis Difficulty walking   PT Problem List Decreased strength;Decreased range of motion;Decreased activity tolerance;Decreased mobility;Decreased knowledge of use of DME;Pain;Obesity  PT Treatment Interventions DME instruction;Gait training;Stair training;Functional mobility training;Therapeutic activities;Therapeutic exercise;Patient/family education   PT Goals (Current goals can be found in the Care Plan section) Acute Rehab PT Goals Patient Stated Goal:  Resume previous lifestyle with decreased pain PT Goal Formulation: With patient Time For Goal Achievement: 04/11/14 Potential to Achieve Goals: Good    Frequency 7X/week   Barriers to discharge        Co-evaluation               End of Session Equipment Utilized During Treatment: Gait belt;Right knee immobilizer Activity Tolerance: Patient tolerated treatment well Patient left: in bed;with call bell/phone within reach Nurse Communication: Mobility status         Time: 1224-4975 PT Time Calculation (min): 32 min   Charges:   PT Evaluation $Initial PT Evaluation Tier I: 1 Procedure PT Treatments $Gait Training: 8-22 mins $Therapeutic Exercise: 8-22 mins   PT G Codes:          Aliyanna Wassmer 04/04/2014, 4:51 PM

## 2014-04-04 NOTE — Anesthesia Postprocedure Evaluation (Signed)
  Anesthesia Post-op Note  Patient: Lori Bradford  Procedure(s) Performed: Procedure(s) (LRB): RIGHT TOTAL KNEE ARTHROPLASTY (Right)  Patient Location: PACU  Anesthesia Type: General  Level of Consciousness: awake and alert   Airway and Oxygen Therapy: Patient Spontanous Breathing  Post-op Pain: mild  Post-op Assessment: Post-op Vital signs reviewed, Patient's Cardiovascular Status Stable, Respiratory Function Stable, Patent Airway and No signs of Nausea or vomiting  Last Vitals:  Filed Vitals:   04/04/14 1015  BP: 137/59  Pulse: 81  Temp:   Resp: 19    Post-op Vital Signs: stable   Complications: No apparent anesthesia complications

## 2014-04-04 NOTE — Transfer of Care (Signed)
Immediate Anesthesia Transfer of Care Note  Patient: Lori Bradford  Procedure(s) Performed: Procedure(s) (LRB): RIGHT TOTAL KNEE ARTHROPLASTY (Right)  Patient Location: PACU  Anesthesia Type: General  Level of Consciousness: sedated, patient cooperative and responds to stimulation  Airway & Oxygen Therapy: Patient Spontanous Breathing and Patient connected to face mask oxgen  Post-op Assessment: Report given to PACU RN and Post -op Vital signs reviewed and stable  Post vital signs: Reviewed and stable  Complications: No apparent anesthesia complications

## 2014-04-04 NOTE — Brief Op Note (Signed)
04/04/2014  9:39 AM  PATIENT:  Lori Bradford  68 y.o. female  PRE-OPERATIVE DIAGNOSIS:  osteoarthritis right knee  POST-OPERATIVE DIAGNOSIS:  osteoarthritis right knee  PROCEDURE:  Procedure(s): RIGHT TOTAL KNEE ARTHROPLASTY (Right)  SURGEON:  Surgeon(s) and Role:    * Mcarthur Rossetti, MD - Primary  PHYSICIAN ASSISTANT: Benita Stabile, PA-C  ANESTHESIA:   general  EBL:  Total I/O In: 1000 [I.V.:1000] Out: 350 [Urine:300; Blood:50]  BLOOD ADMINISTERED:none  DRAINS: none   LOCAL MEDICATIONS USED:  OTHER Experil  SPECIMEN:  No Specimen  DISPOSITION OF SPECIMEN:  N/A  COUNTS:  YES  TOURNIQUET:   Total Tourniquet Time Documented: Thigh (Right) - 89 minutes Total: Thigh (Right) - 89 minutes   DICTATION: .Other Dictation: Dictation Number [id number]  PLAN OF CARE: Admit to inpatient   PATIENT DISPOSITION:  PACU - hemodynamically stable.   Delay start of Pharmacological VTE agent (>24hrs) due to surgical blood loss or risk of bleeding: no

## 2014-04-04 NOTE — Op Note (Signed)
NAMEGAVIN, FAIVRE               ACCOUNT NO.:  [id number]  MEDICAL RECORD NO.:  [id number]  LOCATION:  [facility]                         FACILITY:  [facility]  PHYSICIAN:  Lind Guest. Ninfa Linden, M.D.DATE OF BIRTH:  16-Jun-1945  DATE OF PROCEDURE:  04/04/2014 DATE OF DISCHARGE:                              OPERATIVE REPORT   PREOPERATIVE DIAGNOSES:  Primary osteoarthritis and degenerative joint disease, right knee.  POSTOPERATIVE DIAGNOSES:  Primary osteoarthritis and degenerative joint disease, right knee.  PROCEDURE:  Right total knee arthroplasty.  IMPLANTS:  Stryker triathlon knee with size 2 femur, size 2 tibia, 60 mm polyethylene insert, size 29 patellar button.  SURGEON:  Lind Guest. Ninfa Linden, M.D.  ASSISTANT:  Erskine Emery, PA-C  ANESTHESIA: 1. General. 2. Local with Exparel in the joint.  TOURNIQUET TIME:  Under 2 hours.  BLOOD LOSS:  100 mL.  COMPLICATIONS:  None.  INDICATIONS:  Ms. Wendt is a very pleasant 68 year old female who has severe primary osteoarthritis involving her right knee.  She wished to proceed with a total knee arthroplasty after the failure of conservative treatment.  She has had a previous left total knee arthroplasty.  She understands the risk of acute blood anemia, nerve or vessel injury, fracture, infection, and DVT.  She understands the goals are decreased pain, improved mobility, and overall improved quality of life.  PROCEDURE DESCRIPTION:  After informed consent was obtained, appropriate right knee was marked.  She was brought to the operating room, placed supine on the operating table.  General anesthesia was then obtained.  A nonsterile tourniquet was placed around her upper right thigh and her right leg was prepped and draped with DuraPrep and sterile drapes including a sterile stockinette.  Time-out was called and she was identified as correct patient, correct right knee.  We then used an Esmarch to wrap out the leg and the  tourniquet was inflated to 300 mm of pressure.  I then made a midline incision directly over the patella and carried this proximally and distally.  We dissected down the knee joint and performed a medial parapatellar arthrotomy.  Right away, we found a large effusion and then osteophytes throughout her knee.  There was significant deformity on the medial side with the femoral condyle having indented significantly the tibial plateau.  We needed to make a larger tibial cut due to that.  We set our extramedullary guide for making our tibia cut trying to correct for varus and valgus as well as a negative slope when made this cut.  We then went to the femur and put her intramedullary guide for the femur.  We were able to then put her cutting block on for the femur with 5 degrees right externally rotated and made our distal femoral cut at 10 mm.  We then brought the leg back down in extension and we put the extension block in place and felt like we needed to go upon her extension because of the significant amount of bone we needed to take on the tibial plateau.  We then went back to the femur and put our sizing guide based off the epicondylar axis, chose a size 2 femur.  We made our 4:1 cuts  off with size 2 femur, and then our femoral box cut.  We went back to the tibia and trialed for a size 2 tibia.  I did put some drill holes in the medial side due to the severe sclerotic bone.  We then punched our keel and saw cut for the size 2 tibia.  With the size 2 tibia tray in place as well as the 2 femur, we went all the way up trialing to a 16 polyethylene insert because this offered the best stability with varus valgus.  She also was able to get out of about full extension.  We then made our patellar cut and drilled 3 holes for a size 29 patellar button.  We then irrigated the knee with pulsatile lavage using normal saline solution.  I cleaned the debris from the back of the knee and then injected  Exparel throughout the knee. We then mixed our cement and cemented the real Stryker Triathlon tibial tray size 2, the real size 2 femur was then cemented.  We placed the real 16 mm fix bearing polyethylene insert and cemented the size 29 patellar button.  Once the cement had hardened and dried, hemostasis was obtained with electrocautery after tourniquet was let down.  We then copiously irrigated the knee with normal saline solution using pulsatile lavage again.  I then closed the joint capsule with interrupted #1 Ethibond suture followed by 0-Vicryl in the deep tissue, 2-0 Vicryl in the subcutaneous tissue, 4-0 Monocryl subcuticular stitch, and Steri- Strips on the skin.  Well-padded sterile dressing was applied.  She was awakened, extubated, and taken to the recovery room in stable condition. All final counts were correct.  There were no complications noted.  Of note, Erskine Emery, PA-C assisted during the entire case and his assistance was crucial for facilitating this case.     Lind Guest. Ninfa Linden, M.D.     CYB/MEDQ  D:  04/04/2014  T:  04/04/2014  Job:  [id number]

## 2014-04-05 LAB — BASIC METABOLIC PANEL
Anion gap: 15 (ref 5–15)
BUN: 6 mg/dL (ref 6–23)
CALCIUM: 8.4 mg/dL (ref 8.4–10.5)
CO2: 24 meq/L (ref 19–32)
Chloride: 100 mEq/L (ref 96–112)
Creatinine, Ser: 0.57 mg/dL (ref 0.50–1.10)
GFR calc Af Amer: >90 mL/min (ref >90)
GLUCOSE: 193 mg/dL — AB (ref 70–99)
Potassium: 4.3 mEq/L (ref 3.7–5.3)
Sodium: 139 mEq/L (ref 137–147)

## 2014-04-05 LAB — CBC
HCT: 30.2 % — ABNORMAL LOW (ref 36.0–46.0)
HEMOGLOBIN: 10.1 g/dL — AB (ref 12.0–15.0)
MCH: 29.9 pg (ref 26.0–34.0)
MCHC: 33.4 g/dL (ref 30.0–36.0)
MCV: 89.3 fL (ref 78.0–100.0)
Platelets: 225 10*3/uL (ref 150–400)
RBC: 3.38 MIL/uL — AB (ref 3.87–5.11)
RDW: 13.5 % (ref 11.5–15.5)
WBC: 16.1 10*3/uL — ABNORMAL HIGH (ref 4.0–10.5)

## 2014-04-05 NOTE — Evaluation (Signed)
Occupational Therapy Evaluation Patient Details Name: Lori Bradford MRN: [id number] DOB: [birth year] Today's Date: 04/05/2014    History of Present Illness RTKR   Clinical Impression   All education complete.  Pt very I and feels comfortable with going home tomorrow.    Follow Up Recommendations  No OT follow up          Precautions / Restrictions Precautions Precautions: Fall;Knee Required Braces or Orthoses: Knee Immobilizer - Right Knee Immobilizer - Right: Discontinue once straight leg raise with < 10 degree lag Restrictions Weight Bearing Restrictions: No Other Position/Activity Restrictions: WBAT      Mobility Bed Mobility Overal bed mobility: Needs Assistance Bed Mobility: Supine to Sit     Supine to sit: Supervision Sit to supine: Supervision   General bed mobility comments: cues for sequence and use of L LE to self assist  Transfers Overall transfer level: Needs assistance Equipment used: Rolling walker (2 wheeled) Transfers: Sit to/from Stand Sit to Stand: Supervision                   ADL Overall ADL's : Needs assistance/impaired Eating/Feeding: Independent;Sitting   Grooming: Set up;Standing   Upper Body Bathing: Set up;Sitting   Lower Body Bathing: Minimal assistance;Sit to/from stand   Upper Body Dressing : Set up;Sitting   Lower Body Dressing: Minimal assistance;Sit to/from stand   Toilet Transfer: Supervision/safety;RW   Toileting- Water quality scientist and Hygiene: Supervision/safety;Sit to/from stand       Functional mobility during ADLs: Set up General ADL Comments: verbal cues for safety .                 Pertinent Vitals/Pain Pain Score: 3  Pain Location: r knee Pain Descriptors / Indicators: Aching Pain Intervention(s): Monitored during session;Ice applied;Repositioned     Hand Dominance Right      Communication Communication Communication: No difficulties   Cognition Arousal/Alertness:  Awake/alert Behavior During Therapy: WFL for tasks assessed/performed Overall Cognitive Status: Within Functional Limits for tasks assessed                     General Comments   ex husband will A as needed            Home Living Family/patient expects to be discharged to:: Private residence Living Arrangements: Spouse/significant other;Children Available Help at Discharge: Family Type of Home: Mobile home Home Access: Stairs to enter Technical brewer of Steps: 2 Entrance Stairs-Rails: Right;Left;Can reach both Home Layout: One level     Bathroom Shower/Tub: Occupational psychologist: Handicapped height     Home Equipment: Environmental consultant - 2 wheels          Prior Functioning/Environment Level of Independence: Independent             OT Diagnosis: Generalized weakness         OT Goals(Current goals can be found in the care plan section) Acute Rehab OT Goals Patient Stated Goal: Resume previous lifestyle with decreased pain  OT Frequency:                End of Session Equipment Utilized During Treatment: Rolling walker Nurse Communication: Mobility status  Activity Tolerance: Patient tolerated treatment well Patient left: in bed;with call bell/phone within reach   Time: 1329-1350 OT Time Calculation (min): 21 min Charges:  OT General Charges $OT Visit: 1 Procedure OT Evaluation $Initial OT Evaluation Tier I: 1 Procedure OT Treatments $Self Care/Home Management : 8-22 mins G-Codes:  Betsy Pries 04/05/2014, 1:51 PM

## 2014-04-05 NOTE — Progress Notes (Signed)
Physical Therapy Treatment Patient Details Name: Lori Bradford MRN: [id number] DOB: 11-29-1945 Today's Date: 04/05/2014    History of Present Illness      PT Comments      Follow Up Recommendations  Home health PT     Equipment Recommendations  None recommended by PT    Recommendations for Other Services OT consult     Precautions / Restrictions Precautions Precautions: Fall;Knee Required Braces or Orthoses: Knee Immobilizer - Right Knee Immobilizer - Right: Discontinue once straight leg raise with < 10 degree lag Restrictions Weight Bearing Restrictions: No Other Position/Activity Restrictions: WBAT    Mobility  Bed Mobility Overal bed mobility: Needs Assistance Bed Mobility: Supine to Sit     Supine to sit: Min assist     General bed mobility comments: cues for sequence and use of L LE to self assist  Transfers Overall transfer level: Needs assistance Equipment used: Rolling walker (2 wheeled) Transfers: Sit to/from Stand Sit to Stand: Min assist         General transfer comment: cues for LE management and use of UEs to self assist  Ambulation/Gait Ambulation/Gait assistance: Min assist Ambulation Distance (Feet): 50 Feet (and 18' to bathroom) Assistive device: Rolling walker (2 wheeled) Gait Pattern/deviations: Step-to pattern;Decreased step length - right;Decreased step length - left;Shuffle;Antalgic Gait velocity: decr   General Gait Details: cues for sequence, posture and position from [employer]            Wheelchair Mobility    Modified Rankin (Stroke Patients Only)       Balance                                    Cognition Arousal/Alertness: Awake/alert Behavior During Therapy: WFL for tasks assessed/performed Overall Cognitive Status: Within Functional Limits for tasks assessed                      Exercises Total Joint Exercises Ankle Circles/Pumps: AROM;10 reps;Supine;Both Quad Sets:  AROM;Both;10 reps;Supine Heel Slides: AAROM;10 reps;Supine;Right Straight Leg Raises: AAROM;Right;Supine;15 reps Goniometric ROM: AAROM at R knee -10 - 70    General Comments        Pertinent Vitals/Pain Pain Assessment: 0-10 Pain Score: 4  Pain Location: R knee Pain Descriptors / Indicators: Aching Pain Intervention(s): Limited activity within patient's tolerance;Monitored during session;Premedicated before session;Ice applied    Home Living                      Prior Function            PT Goals (current goals can now be found in the care plan section) Acute Rehab PT Goals Patient Stated Goal: Resume previous lifestyle with decreased pain PT Goal Formulation: With patient Time For Goal Achievement: 04/11/14 Potential to Achieve Goals: Good Progress towards PT goals: Progressing toward goals    Frequency  7X/week    PT Plan Current plan remains appropriate    Co-evaluation             End of Session Equipment Utilized During Treatment: Gait belt;Right knee immobilizer Activity Tolerance: Patient tolerated treatment well Patient left: in chair;with call bell/phone within reach     Time: 9470-9628 PT Time Calculation (min): 38 min  Charges:  $Gait Training: 8-22 mins $Therapeutic Exercise: 8-22 mins $Therapeutic Activity: 8-22 mins  G Codes:      Eri Platten 04-11-14, 12:23 PM

## 2014-04-05 NOTE — Progress Notes (Signed)
Subjective: 1 Day Post-Op Procedure(s) (LRB): RIGHT TOTAL KNEE ARTHROPLASTY (Right) Patient reports pain as mild.  No complaints. positive flatus. Tolerating diet.  Objective: Vital signs in last 24 hours: Temp:  [97.7 F (36.5 C)-98.6 F (37 C)] 98.6 F (37 C) (10/31 0628) Pulse Rate:  [63-88] 73 (10/31 0628) Resp:  [9-27] 18 (10/31 0753) BP: (106-137)/(48-81) 126/56 mmHg (10/31 0628) SpO2:  [97 %-100 %] 99 % (10/31 0753) Weight:  [104.327 kg (230 lb)] 104.327 kg (230 lb) (10/30 1130)  Intake/Output from previous day: 10/30 0701 - 10/31 0700 In: 2765 [P.O.:360; I.V.:2350; IV Piggyback:55] Out: 3900 [Urine:3850; Blood:50] Intake/Output this shift:     Recent Labs  04/05/14 0500  HGB 10.1*    Recent Labs  04/05/14 0500  WBC 16.1*  RBC 3.38*  HCT 30.2*  PLT 225    Recent Labs  04/05/14 0420  NA 139  K 4.3  CL 100  CO2 24  BUN 6  CREATININE 0.57  GLUCOSE 193*  CALCIUM 8.4   No results found for this basename: LABPT, INR,  in the last 72 hours  Neurovascular intact Dorsiflexion/Plantar flexion intact Incision: dressing C/D/I Compartment soft  Assessment/Plan: 1 Day Post-Op Procedure(s) (LRB): RIGHT TOTAL KNEE ARTHROPLASTY (Right) Up with therapy Plan for discharge tomorrow Monitor Hgb and for symptoms of anemia/ Acute blood loss asymptomatic anemia secondary to surgery  Lori Bradford 04/05/2014, 8:06 AM

## 2014-04-05 NOTE — Discharge Instructions (Addendum)
Information on my medicine - XARELTO (Rivaroxaban)  This medication education was reviewed with me or my healthcare representative as part of my discharge preparation.  The pharmacist that spoke with me during my hospital stay was:  Minda Ditto, [facility]  Why was Xarelto prescribed for you? Xarelto was prescribed for you to reduce the risk of blood clots forming after orthopedic surgery. The medical term for these abnormal blood clots is venous thromboembolism (VTE).  What do you need to know about xarelto ? Take your Xarelto ONCE DAILY at the same time every day. You may take it either with or without food.  If you have difficulty swallowing the tablet whole, you may crush it and mix in applesauce just prior to taking your dose.  Take Xarelto exactly as prescribed by your doctor and DO NOT stop taking Xarelto without talking to the doctor who prescribed the medication.  Stopping without other VTE prevention medication to take the place of Xarelto may increase your risk of developing a clot.  After discharge, you should have regular check-up appointments with your healthcare provider that is prescribing your Xarelto.    What do you do if you miss a dose? If you miss a dose, take it as soon as you remember on the same day then continue your regularly scheduled once daily regimen the next day. Do not take two doses of Xarelto on the same day.   Important Safety Information A possible side effect of Xarelto is bleeding. You should call your healthcare provider right away if you experience any of the following: ? Bleeding from an injury or your nose that does not stop. ? Unusual colored urine (red or dark brown) or unusual colored stools (red or black). ? Unusual bruising for unknown reasons. ? A serious fall or if you hit your head (even if there is no bleeding).  Some medicines may interact with Xarelto and might increase your risk of bleeding while on Xarelto. To help avoid this,  consult your healthcare provider or pharmacist prior to using any new prescription or non-prescription medications, including herbals, vitamins, non-steroidal anti-inflammatory drugs (NSAIDs) and supplements. Naproxen  This website has more information on Xarelto: [email or web address]   Pickup stool softener for constipation. Right lower extremity Weight Bearing as tolerated  Progress activities slowly Expect  right knee soreness and swelling. Apply heat or ice as needed. Keep  Right Knee dressing clean dry and intact, may shower with dressing intact.

## 2014-04-05 NOTE — Plan of Care (Signed)
Problem: Phase III Progression Outcomes Goal: Anticoagulant follow-up in place Outcome: Not Applicable Date Met:  90/93/11 xarelto

## 2014-04-06 MED ORDER — OXYCODONE-ACETAMINOPHEN 5-325 MG PO TABS: 1.0000 | ORAL_TABLET | ORAL | Status: DC | PRN

## 2014-04-06 MED ORDER — DSS 100 MG PO CAPS: 100.0000 mg | ORAL_CAPSULE | Freq: Two times a day (BID) | ORAL | Status: DC

## 2014-04-06 MED ORDER — RIVAROXABAN 10 MG PO TABS: 10.0000 mg | ORAL_TABLET | Freq: Every day | ORAL | Status: DC

## 2014-04-06 NOTE — Progress Notes (Signed)
Discharged from floor via w/c, family with pt. No changes in assessment. Cade Dashner  

## 2014-04-06 NOTE — Plan of Care (Signed)
Problem: Discharge Progression Outcomes Goal: Barriers To Progression Addressed/Resolved Outcome: Not Applicable Date Met:  87/56/43 Goal: CMS/Neurovascular status at or above baseline Outcome: Completed/Met Date Met:  04/06/14 Goal: Anticoagulant follow-up in place Outcome: Completed/Met Date Met:  04/06/14 Goal: Pain controlled with appropriate interventions Outcome: Completed/Met Date Met:  04/06/14 Goal: Hemodynamically stable Outcome: Completed/Met Date Met:  32/95/18 Goal: Complications resolved/controlled Outcome: Not Applicable Date Met:  84/16/60 Goal: Tolerates diet Outcome: Completed/Met Date Met:  04/06/14 Goal: Activity appropriate for discharge plan Outcome: Completed/Met Date Met:  04/06/14 Goal: Ambulates safely using assistive device Outcome: Completed/Met Date Met:  04/06/14 Goal: Follows weight - bearing limitations Outcome: Completed/Met Date Met:  04/06/14 Goal: Discharge plan in place and appropriate Outcome: Completed/Met Date Met:  04/06/14 Goal: Negotiates stairs Outcome: Completed/Met Date Met:  04/06/14 Goal: Demonstrates ADLs as appropriate Outcome: Completed/Met Date Met:  04/06/14 Goal: Incision without S/S infection Outcome: Completed/Met Date Met:  04/06/14 Goal: Other Discharge Outcomes/Goals Outcome: Not Applicable Date Met:  63/01/60

## 2014-04-06 NOTE — Progress Notes (Signed)
Subjective: 2 Days Post-Op Procedure(s) (LRB): RIGHT TOTAL KNEE ARTHROPLASTY (Right) Patient reports pain as mild.   No complaints wanting to go home today. Objective: Vital signs in last 24 hours: Temp:  [98.1 F (36.7 C)-98.7 F (37.1 C)] 98.7 F (37.1 C) (10/31 2250) Pulse Rate:  [66-86] 75 (10/31 2250) Resp:  [16-20] 20 (10/31 2250) BP: (104-128)/(41-43) 107/41 mmHg (10/31 2250) SpO2:  [96 %-97 %] 97 % (10/31 2250)  Intake/Output from previous day: 10/31 0701 - 11/01 0700 In: 3455 [P.O.:1840; I.V.:1615] Out: 3650 [Urine:3650] Intake/Output this shift:     Recent Labs  04/05/14 0500  HGB 10.1*    Recent Labs  04/05/14 0500  WBC 16.1*  RBC 3.38*  HCT 30.2*  PLT 225    Recent Labs  04/05/14 0420  NA 139  K 4.3  CL 100  CO2 24  BUN 6  CREATININE 0.57  GLUCOSE 193*  CALCIUM 8.4   No results for input(s): LABPT, INR in the last 72 hours.  Neurovascular intact Incision: scant drainage Compartment soft Wound benign  Assessment/Plan: 2 Days Post-Op Procedure(s) (LRB): RIGHT TOTAL KNEE ARTHROPLASTY (Right) Up with therapy Discharge home with home health  Lori Bradford 04/06/2014, 8:56 AM

## 2014-04-06 NOTE — Progress Notes (Signed)
CARE MANAGEMENT NOTE 04/06/2014  Patient:  JAGGER, BEAHM N   Account Number:  [id number]  Date Initiated:  04/06/2014  Documentation initiated by:  [facility]  Subjective/Objective Assessment:   RIGHT TOTAL KNEE ARTHROPLASTY (     Action/Plan:   Anticipated DC Date:  04/06/2014   Anticipated DC Plan:  [place]  CM consult      [facility] Choice  HOME HEALTH   Choice offered to / List presented to:  C-1 Patient        [place] arranged  HH-2 PT      [place]   Status of service:  Completed, signed off Medicare Important Message given?  NA - LOS <3 / Initial given by admissions (If response is "NO", the following Medicare IM given date fields will be blank) Date Medicare IM given:   Medicare IM given by:   Date Additional Medicare IM given:   Additional Medicare IM given by:    Discharge Disposition:  [place]  Per UR Regulation:    If discussed at Long Length of Stay Meetings, dates discussed:    Comments:  11//06/2013 1000 NCM spoke to pt and has RW and 3n1 at home. [facility] arranged for [facility]. Jonnie Finner RN CCM Case Mgmt phone [phone]

## 2014-04-06 NOTE — Progress Notes (Signed)
Physical Therapy Treatment Patient Details Name: Lori Bradford MRN: [id number] DOB: [birth year] Today's Date: 04/06/2014    History of Present Illness RTKR    PT Comments    Pt motivated but impulsive.  Reviewed stairs and therex with handouts provided  Follow Up Recommendations  Home health PT     Equipment Recommendations  None recommended by PT    Recommendations for Other Services OT consult     Precautions / Restrictions Precautions Precautions: Fall;Knee Required Braces or Orthoses: Knee Immobilizer - Right (Pt performed IND SLR this am) Knee Immobilizer - Right: Discontinue once straight leg raise with < 10 degree lag Restrictions Weight Bearing Restrictions: No Other Position/Activity Restrictions: WBAT    Mobility  Bed Mobility Overal bed mobility: Needs Assistance Bed Mobility: Supine to Sit     Supine to sit: Supervision     General bed mobility comments: cues for sequence  Transfers Overall transfer level: Needs assistance Equipment used: Rolling walker (2 wheeled) Transfers: Sit to/from Stand Sit to Stand: Supervision         General transfer comment: cues for LE management and use of UEs to self assist  Ambulation/Gait Ambulation/Gait assistance: Supervision Ambulation Distance (Feet): 75 Feet Assistive device: Rolling walker (2 wheeled) Gait Pattern/deviations: Step-to pattern;Decreased step length - right;Decreased step length - left;Shuffle;Antalgic;Trunk flexed Gait velocity: decr   General Gait Details: cues for sequence, posture and position from RW   Stairs Stairs: Yes Stairs assistance: Min assist Stair Management: Two rails;Step to pattern;Forwards Number of Stairs: 5 General stair comments: cues for sequence and foot placement  Wheelchair Mobility    Modified Rankin (Stroke Patients Only)       Balance                                    Cognition Arousal/Alertness: Awake/alert Behavior During  Therapy: WFL for tasks assessed/performed Overall Cognitive Status: Within Functional Limits for tasks assessed                      Exercises Total Joint Exercises Ankle Circles/Pumps: AROM;Supine;Both;15 reps Quad Sets: AROM;Both;Supine;15 reps Heel Slides: AAROM;Supine;Right;15 reps Straight Leg Raises: AAROM;Right;Supine;15 reps    General Comments        Pertinent Vitals/Pain Pain Assessment: 0-10 Pain Score: 4  Pain Location: R knee Pain Descriptors / Indicators: Aching;Sore Pain Intervention(s): Limited activity within patient's tolerance;Monitored during session;Premedicated before session;Ice applied    Home Living                      Prior Function            PT Goals (current goals can now be found in the care plan section) Acute Rehab PT Goals Patient Stated Goal: Resume previous lifestyle with decreased pain PT Goal Formulation: With patient Time For Goal Achievement: 04/11/14 Potential to Achieve Goals: Good Progress towards PT goals: Progressing toward goals    Frequency  7X/week    PT Plan Current plan remains appropriate    Co-evaluation             End of Session Equipment Utilized During Treatment: Gait belt Activity Tolerance: Patient tolerated treatment well Patient left: in chair;with call bell/phone within reach     Time: 1000-1024 PT Time Calculation (min): 24 min  Charges:  $Gait Training: 8-22 mins $Therapeutic Exercise: 8-22 mins  G Codes:      Maleek Craver 05/02/14, 2:48 PM

## 2014-04-06 NOTE — Plan of Care (Signed)
Problem: Discharge Progression Outcomes Goal: Anticoagulant follow-up in place Outcome: Not Applicable Date Met:  94/71/25 xarelto
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
## 2014-04-07 ENCOUNTER — Encounter (HOSPITAL_COMMUNITY): Payer: Self-pay | Admitting: Orthopaedic Surgery

## 2014-04-07 NOTE — Progress Notes (Signed)
Utilization review completed.  

## 2014-07-03 ENCOUNTER — Ambulatory Visit (INDEPENDENT_AMBULATORY_CARE_PROVIDER_SITE_OTHER): Payer: Commercial Managed Care - HMO | Admitting: Internal Medicine

## 2014-07-03 ENCOUNTER — Encounter: Payer: Self-pay | Admitting: Internal Medicine

## 2014-07-03 VITALS — BP 138/52 | HR 85 | Temp 99.7°F | Ht 62.0 in | Wt 216.9 lb

## 2014-07-03 DIAGNOSIS — G4700 Insomnia, unspecified: Secondary | ICD-10-CM

## 2014-07-03 DIAGNOSIS — Z96651 Presence of right artificial knee joint: Secondary | ICD-10-CM | POA: Diagnosis not present

## 2014-07-03 DIAGNOSIS — L989 Disorder of the skin and subcutaneous tissue, unspecified: Secondary | ICD-10-CM | POA: Insufficient documentation

## 2014-07-03 DIAGNOSIS — I10 Essential (primary) hypertension: Secondary | ICD-10-CM | POA: Diagnosis not present

## 2014-07-03 DIAGNOSIS — M79661 Pain in right lower leg: Secondary | ICD-10-CM

## 2014-07-03 DIAGNOSIS — R0683 Snoring: Secondary | ICD-10-CM

## 2014-07-03 LAB — BASIC METABOLIC PANEL
BUN: 6 mg/dL (ref 6–23)
CALCIUM: 9.6 mg/dL (ref 8.4–10.5)
CHLORIDE: 98 meq/L (ref 96–112)
CO2: 26 meq/L (ref 19–32)
Creat: 0.5 mg/dL (ref 0.50–1.10)
GLUCOSE: 103 mg/dL — AB (ref 70–99)
Potassium: 4.1 mEq/L (ref 3.5–5.3)
Sodium: 134 mEq/L — ABNORMAL LOW (ref 135–145)

## 2014-07-03 LAB — D-DIMER, QUANTITATIVE: D-Dimer, Quant: 2.14 ug/mL-FEU — ABNORMAL HIGH (ref 0.00–0.48)

## 2014-07-03 LAB — CK: CK TOTAL: 54 U/L (ref 7–177)

## 2014-07-03 LAB — MAGNESIUM: Magnesium: 2 mg/dL (ref 1.5–2.5)

## 2014-07-03 MED ORDER — ALPRAZOLAM 1 MG PO TABS: 1.0000 mg | ORAL_TABLET | Freq: Every day | ORAL | Status: DC

## 2014-07-03 MED ORDER — CYCLOBENZAPRINE HCL 5 MG PO TABS: 5.0000 mg | ORAL_TABLET | Freq: Three times a day (TID) | ORAL | Status: DC | PRN

## 2014-07-03 MED ORDER — LISINOPRIL-HYDROCHLOROTHIAZIDE 20-12.5 MG PO TABS: 1.0000 | ORAL_TABLET | ORAL | Status: DC

## 2014-07-03 NOTE — Assessment & Plan Note (Addendum)
Complaints of pain in calf- right since surgery. Pain is without redness or swelling. No tenderness or concerning features on exam. Was on xarelto for a day after surgery but that was d/c after she had drainage and bleeding from Op site. Surgery- 04/04/2014. Pt describes the pain as crampy like. No features of Pulm embolism per hx. Pain is interfering with activity. Pain might also be cramps, which usually is idiopathic, if electrolyte abnormality rules out. Pt stays hydrated.  Plan- Since exam is not suggestive of DVT, will order a DDimer, if positive then Lower extremity doppler. - Bmet- check K, ca - Mag - trial of flexeril- 5mg  TID as needed. - CK.   Addendum- 07/04/2013- elevated Ddimer- Pt will need lower extremity Ultrasound scan of right lower extremity to exclude DVT. Called pt and left a voice mail message.

## 2014-07-03 NOTE — Assessment & Plan Note (Addendum)
What appears to be a stuck appearing lesion, raised, dry on her back, ~1 by ~2cm - differentials- Seborrheic keratosis, also with skin tags- which appears benign. Both of which are chronic, and stable, without recent change. Pt is requesting refferal to dermatology for removal.

## 2014-07-03 NOTE — Patient Instructions (Signed)
General Instructions:  We will be doing some blood work to be sure that you have no electrolyte abnormality and also that you have no blood clot in your legs. If out test results suggest that you have blood clots we will contact you, to do a scan of your legs.   We have also prescribed a medication to help with the crampy sensation you have ion your legs. Take it as needed when you have cramps in your legs. We will see you in 3 months if all your lab work is normal.  Please bring your medicines with you each time you come to clinic.  Medicines may include prescription medications, over-the-counter medications, herbal remedies, eye drops, vitamins, or other pills.
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
## 2014-07-03 NOTE — Assessment & Plan Note (Addendum)
Pt presently denies snoring at night, but she lives alone, denies waking up at night SOB, denies poor sleep- though she takes xanax, denies, feeling unrested in the morning when she wakes up. Previously was planned for and agreed to having a sleep study.  03/27/2014- Pt presented for pre-admission testing and screened positive for OSA with the sleep bang tool. At follow up visit will re-evaluate for OSA and consider dedicated sleep study/polysomnography.  Pt declines any screening at this time.

## 2014-07-03 NOTE — Assessment & Plan Note (Signed)
Has a medication contract for 1mg  of Xanax at night. Requesting refills. No red flags. She says she cannot sleep if she does not take this medication. Will refill-

## 2014-07-03 NOTE — Assessment & Plan Note (Signed)
BP Readings from Last 3 Encounters:  07/03/14 138/52  04/05/14 107/41  03/21/14 123/56    Lab Results  Component Value Date   NA 139 04/05/2014   K 4.3 04/05/2014   CREATININE 0.57 04/05/2014    Assessment: Blood pressure control:  Controlled Progress toward BP goal:    At goal Comments: Complaint with meds- HCTZ- Lisinopril- 20-12.5mg  daily  Plan: Medications:  continue current medications Educational resources provided: handout Self management tools provided: home blood pressure logbook Other plans:

## 2014-07-03 NOTE — Assessment & Plan Note (Signed)
Pt working on [employer]. Recheck HGBA1c next visit. She is not interested in routine health screening related to DM or other screenings. Weight today- 216.9, Last visit in Oct- 230. She is avoiding Sodas, has cut down on meal potions, and is avoiding fast foods. Congratulated on weightloss.

## 2014-07-04 NOTE — Progress Notes (Signed)
Medicine attending: Medical history, presenting problems, physical findings, and medications, reviewed with Dr Denton Brick on the day of the patient visit and I concur with her evaluation and management plan.

## 2014-07-04 NOTE — Addendum Note (Signed)
Addended by: Bethena Roys on: 07/04/2014 08:34 PM   Modules accepted: Orders

## 2014-07-10 ENCOUNTER — Other Ambulatory Visit: Payer: Self-pay | Admitting: Internal Medicine

## 2014-07-10 ENCOUNTER — Ambulatory Visit (HOSPITAL_COMMUNITY)
Admission: RE | Admit: 2014-07-10 | Discharge: 2014-07-10 | Disposition: A | Discharge status: Home / Self Care | Payer: Commercial Managed Care - HMO | Source: Ambulatory Visit | Attending: Oncology | Admitting: Oncology

## 2014-07-10 DIAGNOSIS — M79661 Pain in right lower leg: Secondary | ICD-10-CM

## 2014-07-10 DIAGNOSIS — M79604 Pain in right leg: Secondary | ICD-10-CM | POA: Insufficient documentation

## 2014-07-10 DIAGNOSIS — M7989 Other specified soft tissue disorders: Secondary | ICD-10-CM | POA: Insufficient documentation

## 2014-07-10 DIAGNOSIS — M79609 Pain in unspecified limb: Secondary | ICD-10-CM | POA: Diagnosis not present

## 2014-07-10 NOTE — Progress Notes (Signed)
VASCULAR LAB PRELIMINARY  PRELIMINARY  PRELIMINARY  PRELIMINARY  Right lower extremity venous duplex completed.    Preliminary report:  Right:  No evidence of DVT, superficial thrombosis, or Baker's cyst.  Angelice Piech, RVS 07/10/2014, 3:51 PM

## 2014-08-19 DIAGNOSIS — D2239 Melanocytic nevi of other parts of face: Secondary | ICD-10-CM | POA: Diagnosis not present

## 2014-08-19 DIAGNOSIS — L820 Inflamed seborrheic keratosis: Secondary | ICD-10-CM | POA: Diagnosis not present

## 2014-08-19 DIAGNOSIS — L821 Other seborrheic keratosis: Secondary | ICD-10-CM | POA: Diagnosis not present

## 2014-09-19 ENCOUNTER — Other Ambulatory Visit: Payer: Self-pay | Admitting: *Deleted

## 2014-09-19 DIAGNOSIS — G4700 Insomnia, unspecified: Secondary | ICD-10-CM

## 2014-09-19 DIAGNOSIS — I10 Essential (primary) hypertension: Secondary | ICD-10-CM

## 2014-09-19 NOTE — Telephone Encounter (Signed)
Pt is requesting 90 days supply

## 2014-09-22 MED ORDER — LISINOPRIL-HYDROCHLOROTHIAZIDE 20-12.5 MG PO TABS: 1.0000 | ORAL_TABLET | ORAL | Status: DC

## 2014-09-22 MED ORDER — ALPRAZOLAM 1 MG PO TABS: 1.0000 mg | ORAL_TABLET | Freq: Every day | ORAL | Status: DC

## 2014-09-22 NOTE — Telephone Encounter (Signed)
Alprazolam rx called to [facility] pharmacy. 

## 2014-10-15 ENCOUNTER — Ambulatory Visit (INDEPENDENT_AMBULATORY_CARE_PROVIDER_SITE_OTHER): Payer: Commercial Managed Care - HMO | Admitting: Pulmonary Disease

## 2014-10-15 ENCOUNTER — Encounter: Payer: Self-pay | Admitting: Pulmonary Disease

## 2014-10-15 VITALS — BP 124/41 | HR 92 | Temp 98.2°F | Ht 62.0 in | Wt 225.1 lb

## 2014-10-15 DIAGNOSIS — M19042 Primary osteoarthritis, left hand: Principal | ICD-10-CM

## 2014-10-15 DIAGNOSIS — M19041 Primary osteoarthritis, right hand: Secondary | ICD-10-CM

## 2014-10-15 DIAGNOSIS — M1990 Unspecified osteoarthritis, unspecified site: Secondary | ICD-10-CM

## 2014-10-15 DIAGNOSIS — I10 Essential (primary) hypertension: Secondary | ICD-10-CM

## 2014-10-15 MED ORDER — IBUPROFEN 600 MG PO TABS: 600.0000 mg | ORAL_TABLET | Freq: Four times a day (QID) | ORAL | Status: DC | PRN

## 2014-10-15 NOTE — Patient Instructions (Addendum)
You may try over the counter capsaicin creams, Bengay, or Aspercreme.  Try resting your hands for 12-24 hours.
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
## 2014-10-16 NOTE — Assessment & Plan Note (Addendum)
BP Readings from Last 3 Encounters:  10/15/14 124/41  07/03/14 138/52  04/05/14 107/41    Lab Results  Component Value Date   NA 134* 07/03/2014   K 4.1 07/03/2014   CREATININE 0.50 07/03/2014    Assessment: Blood pressure control: controlled Progress toward BP goal:  at goal  Plan: Medications:  continue current medications including lisinopril-HCTZ 20-12.5mg  Other plans:  -Follow up with PCP in 2 months to address chronic medical problems

## 2014-10-16 NOTE — Progress Notes (Signed)
Internal Medicine Clinic Attending  Case discussed with Dr. Krall at the time of the visit.  We reviewed the resident's history and exam and pertinent patient test results.  I agree with the assessment, diagnosis, and plan of care documented in the resident's note.  

## 2014-10-16 NOTE — Assessment & Plan Note (Signed)
Assessment: With distribution of pain to DIP/PIP joints, she likely has osteoarthritis in her hands.   Plan: -Ibuprofen 600mg  Q6hr prn pain -OTC capsaicin cream, brief rest of 12-24 hours -Follow up with Sports Medicine

## 2014-11-21 ENCOUNTER — Encounter: Payer: Self-pay | Admitting: Family Medicine

## 2014-11-21 ENCOUNTER — Ambulatory Visit (INDEPENDENT_AMBULATORY_CARE_PROVIDER_SITE_OTHER): Payer: Commercial Managed Care - HMO | Admitting: Family Medicine

## 2014-11-21 VITALS — BP 104/43 | Ht 63.0 in | Wt 225.0 lb

## 2014-11-21 DIAGNOSIS — M19049 Primary osteoarthritis, unspecified hand: Secondary | ICD-10-CM

## 2014-11-21 DIAGNOSIS — M19041 Primary osteoarthritis, right hand: Secondary | ICD-10-CM

## 2014-11-21 DIAGNOSIS — M75101 Unspecified rotator cuff tear or rupture of right shoulder, not specified as traumatic: Secondary | ICD-10-CM | POA: Diagnosis not present

## 2014-11-21 DIAGNOSIS — M75102 Unspecified rotator cuff tear or rupture of left shoulder, not specified as traumatic: Secondary | ICD-10-CM | POA: Diagnosis not present

## 2014-11-21 DIAGNOSIS — Z96653 Presence of artificial knee joint, bilateral: Secondary | ICD-10-CM | POA: Diagnosis not present

## 2014-11-21 DIAGNOSIS — M25519 Pain in unspecified shoulder: Secondary | ICD-10-CM

## 2014-11-21 DIAGNOSIS — M79644 Pain in right finger(s): Secondary | ICD-10-CM

## 2014-11-21 DIAGNOSIS — M1811 Unilateral primary osteoarthritis of first carpometacarpal joint, right hand: Secondary | ICD-10-CM

## 2014-11-21 DIAGNOSIS — M1810 Unilateral primary osteoarthritis of first carpometacarpal joint, unspecified hand: Secondary | ICD-10-CM

## 2014-11-21 MED ORDER — METHYLPREDNISOLONE ACETATE 40 MG/ML IJ SUSP
40.0000 mg | Freq: Once | INTRAMUSCULAR | Status: AC
  Administered 2014-11-21: 40 mg via INTRA_ARTICULAR

## 2014-11-22 DIAGNOSIS — M19041 Primary osteoarthritis, right hand: Secondary | ICD-10-CM | POA: Insufficient documentation

## 2014-11-22 DIAGNOSIS — M19042 Primary osteoarthritis, left hand: Secondary | ICD-10-CM | POA: Insufficient documentation

## 2014-11-22 NOTE — Assessment & Plan Note (Signed)
CSI today Restart HEP

## 2014-11-22 NOTE — Assessment & Plan Note (Signed)
CSI today

## 2014-11-22 NOTE — Progress Notes (Signed)
   Subjective:    Patient ID: Lori Bradford, female    DOB: 1946/03/08, 69 y.o.   MRN: [id number]  HPI Pain inleft shoulder area has started to return. Has not been very rigorous w exercises. Had great relief CSI a year ago and only started having problems in last 1-2 months. Pain with overhead and lateral activities, some pain at night finding a comfortable position but  Does not awaken her  Right knee--s/p BTKR---rigt feels weak and aches. Her orthopedist has signed off giving her cklean bill of health except for annual check ups. Dr. Ninfa Linden did both knees and left has done fine but right not as well  Right thumb stiffness and pain. Having some aching in all joints of her hands, generally R>L. Right hand dominant. Pain with grip and opening jars, Pain at CMP and CMC thumb.   Review of Systems Pertinent review of systems: negative for fever or unusual weight change. Has not noticed any swelling redness or warmth of joints, just stiffness and pain as per HPI.    Objective:   Physical Exam Vital signs reviewed.  GENERAL: WD slightly overweight female, NAD HANDS. No thenar or hypothenar atrophy.  B mild changes multiple fingers'DIP and PIP  Joints with stiffness and a few nodes. Both thumbs are quite stiff.Left thumb CMP is very TTP with movement and has decreased ROM in extension and opposition. Mild ttp left 1st [place] area . SHOULDER: Left. Pain with supraspinatus testing but no weakness in any plane of rotator cuff. AC joint mildly ttp but no redness or warmth. WRISTS FROM, no swelling.  KNEES: B well healed midline anterior scars from TKR. LEFT has painless full extension and flexion that is smooth. RIGHT knee has soft tissue defect over area of quad tendon but there is no TTP.She has full extension but mild pain with that movement. NEURO: intact sensation to soft touch B hands.  INJECTION:US guided right thumb CMP. CMP joint has significant decrease in joint space with several  osteophytes. There is no significant joint effusion. Patient was given informed consent, signed copy in the chart. Appropriate time out was taken. Area prepped and draped in usual sterile fashion. Dr. Kandace Parkins asssited by distracting the CMP joint and holding the US probe while I  Injected  1/2 cc of methylprednisolone 40 mg/ml plus  1/2 cc of 1% lidocaine without epinephrine into the RIGHT !st CMP using a(n) US guided approach. The patient tolerated the procedure well. There were no complications. Post procedure instructions were given.  INJECTION: Patient was given informed consent, signed copy in the chart. Appropriate time out was taken. Area prepped and draped in usual sterile fashion. 1 cc of methylprednisolone 40 mg/ml plus  4 cc of 1% lidocaine without epinephrine was injected into the LEFT subacromial bursa using a(n) posterior approach. The patient tolerated the procedure well. There were no complications. Post procedure instructions were given.       Assessment & Plan:

## 2014-11-22 NOTE — Assessment & Plan Note (Signed)
Left knee did really well. Right knee has not done a swell. I gave her a rehab program today focusing on quad and VMO specifically development. Her clinical exam reveals an 2 inch difference in quad bulk right at the quad tendon. She may need to go back and see Dr Ninfa Linden again or we may need to [place] her quad tendon at future visit. There was not time to d oknee [place] today as we were focused on thumb and shoulder.

## 2014-12-20 ENCOUNTER — Other Ambulatory Visit: Payer: Self-pay | Admitting: Internal Medicine

## 2015-01-19 ENCOUNTER — Other Ambulatory Visit: Payer: Self-pay | Admitting: Internal Medicine

## 2015-01-19 DIAGNOSIS — G4700 Insomnia, unspecified: Secondary | ICD-10-CM

## 2015-01-19 NOTE — Telephone Encounter (Signed)
[facility] [address] [place]. Needs Sleeping medication, ibuprofen and blood pressure medication

## 2015-01-21 MED ORDER — IBUPROFEN 600 MG PO TABS: 600.0000 mg | ORAL_TABLET | Freq: Four times a day (QID) | ORAL | Status: DC | PRN

## 2015-01-21 MED ORDER — ALPRAZOLAM 1 MG PO TABS: 1.0000 mg | ORAL_TABLET | Freq: Every day | ORAL | Status: DC

## 2015-01-21 NOTE — Telephone Encounter (Signed)
Called to pharm 

## 2015-01-23 ENCOUNTER — Other Ambulatory Visit: Payer: Self-pay | Admitting: Internal Medicine

## 2015-01-23 DIAGNOSIS — M79646 Pain in unspecified finger(s): Secondary | ICD-10-CM

## 2015-02-13 ENCOUNTER — Ambulatory Visit: Payer: Commercial Managed Care - HMO | Admitting: Family Medicine

## 2015-03-11 DIAGNOSIS — M1711 Unilateral primary osteoarthritis, right knee: Secondary | ICD-10-CM | POA: Diagnosis not present

## 2015-03-11 DIAGNOSIS — Z96651 Presence of right artificial knee joint: Secondary | ICD-10-CM | POA: Diagnosis not present

## 2015-03-13 ENCOUNTER — Encounter: Payer: Self-pay | Admitting: Family Medicine

## 2015-03-13 ENCOUNTER — Ambulatory Visit (INDEPENDENT_AMBULATORY_CARE_PROVIDER_SITE_OTHER): Payer: Commercial Managed Care - HMO | Admitting: Family Medicine

## 2015-03-13 VITALS — BP 136/56 | Ht 63.0 in | Wt 224.0 lb

## 2015-03-13 DIAGNOSIS — M6248 Contracture of muscle, other site: Secondary | ICD-10-CM | POA: Diagnosis not present

## 2015-03-13 DIAGNOSIS — M62838 Other muscle spasm: Secondary | ICD-10-CM

## 2015-03-13 MED ORDER — IBUPROFEN 600 MG PO TABS: ORAL_TABLET | ORAL | Status: DC

## 2015-03-13 MED ORDER — METHYLPREDNISOLONE ACETATE 40 MG/ML IJ SUSP
40.0000 mg | Freq: Once | INTRAMUSCULAR | Status: AC
  Administered 2015-03-13: 40 mg via INTRA_ARTICULAR

## 2015-03-13 NOTE — Patient Instructions (Signed)
Add the OVERHEAD PRESS exercise, using a weight you can do 12 repetitions. Do 2 or 3 sets each time you are at the gym. At home, you can do this exercise with "free weights" or heavy cans or milk jugs full of water.  Great to see you! If the trigger point injections do not help, come back and see me in a couple of weeks!

## 2015-03-14 NOTE — Progress Notes (Signed)
   Subjective:    Patient ID: Lori Bradford, female    DOB: March 03, 1946, 69 y.o.   MRN: [id number]  HPI Complaint of right-sided posterior neck pain.  It has been causing a few headaches in the last couple of weeks.  Unclear what started it, no specific injury, no unusual activities.  Pain is constant, worse with certain movements such as overhead-type activity.  Headache occurs later in the day.  Headache resolves with some over-the-counter Tylenol, lying down.  Headache is not associated with any type of visual changes, no dizziness, no nausea.   Review of Systems See HPI    Objective:   Physical Exam Final signs are reviewed General: Well-developed overweight female no distress.  NECK: Full range of motion.  She has some pulling sensation with flexion forward but has full range of motion.  Tenderness to palpation in the posterior neck muscles and along the right occiput.  She has multiple trigger point tender areas in the right trapezius.  Bilateral upper extremities have full range of motion and normal strength.  Mild tenderness palpation over the right rhomboid.  INJECTION: Patient was given informed consent, signed copy in the chart. Appropriate time out was taken. Area prepped and draped in usual sterile fashion. 1 cc of methylprednisolone 40 mg/ml plus  3 cc of 1% lidocaine without epinephrine was injected into the Trigger point areas of the right trapezius, 3 separate injections, using a(n) Perpendicular approach. The patient tolerated the procedure well. There were no complications. Post procedure instructions were given.        Assessment & Plan:  Neck pain from muscle spasm.  We will try trigger point injections today.  Also gave her handout on 2 exercises to do, overhead press and modified pushup.  She is not having some improvement over the next 2-3 weeks she'll return to clinic.  I see no red Flags regarding her headaches as they seem very consistent with muscle tension in  origin.

## 2015-04-28 ENCOUNTER — Encounter: Payer: Self-pay | Admitting: Student

## 2015-05-11 ENCOUNTER — Other Ambulatory Visit: Payer: Self-pay | Admitting: Internal Medicine

## 2015-05-11 DIAGNOSIS — G4700 Insomnia, unspecified: Secondary | ICD-10-CM

## 2015-05-11 NOTE — Telephone Encounter (Signed)
Pt requesting alprazolam to be filled. °

## 2015-05-11 NOTE — Telephone Encounter (Signed)
Pt called back regarding alprazolam, stating she really need this medication by today. Pt states she has not slept for three nights, and want this medicine by today. Please call pt back.

## 2015-05-12 NOTE — Telephone Encounter (Signed)
Pt called again regarding her alprazolam.  Advised her that PCP has 48 hours to complete requests for refills so in the future not to wait until completely out of medicine.  Pt says she hasn't had any since last week.  Request retasked to PCP

## 2015-05-13 MED ORDER — ALPRAZOLAM 1 MG PO TABS: 1.0000 mg | ORAL_TABLET | Freq: Every day | ORAL | Status: DC

## 2015-05-13 NOTE — Telephone Encounter (Signed)
Called to pharm, called pt gave appt

## 2015-05-19 ENCOUNTER — Encounter: Payer: Self-pay | Admitting: Internal Medicine

## 2015-05-19 ENCOUNTER — Ambulatory Visit (INDEPENDENT_AMBULATORY_CARE_PROVIDER_SITE_OTHER): Payer: Commercial Managed Care - HMO | Admitting: Internal Medicine

## 2015-05-19 VITALS — BP 146/67 | HR 92 | Temp 98.3°F | Ht 62.0 in | Wt 225.5 lb

## 2015-05-19 DIAGNOSIS — Z880 Allergy status to penicillin: Secondary | ICD-10-CM

## 2015-05-19 DIAGNOSIS — I10 Essential (primary) hypertension: Secondary | ICD-10-CM

## 2015-05-19 DIAGNOSIS — F5104 Psychophysiologic insomnia: Secondary | ICD-10-CM

## 2015-05-19 DIAGNOSIS — S91302A Unspecified open wound, left foot, initial encounter: Secondary | ICD-10-CM

## 2015-05-19 DIAGNOSIS — W228XXA Striking against or struck by other objects, initial encounter: Secondary | ICD-10-CM

## 2015-05-19 DIAGNOSIS — G4700 Insomnia, unspecified: Secondary | ICD-10-CM

## 2015-05-19 DIAGNOSIS — S91309A Unspecified open wound, unspecified foot, initial encounter: Secondary | ICD-10-CM | POA: Insufficient documentation

## 2015-05-19 DIAGNOSIS — D649 Anemia, unspecified: Secondary | ICD-10-CM

## 2015-05-19 DIAGNOSIS — R7303 Prediabetes: Secondary | ICD-10-CM

## 2015-05-19 MED ORDER — LISINOPRIL-HYDROCHLOROTHIAZIDE 20-12.5 MG PO TABS: 1.0000 | ORAL_TABLET | ORAL | Status: DC

## 2015-05-19 MED ORDER — DOXYCYCLINE HYCLATE 100 MG PO CAPS: 100.0000 mg | ORAL_CAPSULE | Freq: Two times a day (BID) | ORAL | Status: DC

## 2015-05-19 MED ORDER — ALPRAZOLAM 1 MG PO TABS: 1.0000 mg | ORAL_TABLET | Freq: Every day | ORAL | Status: DC

## 2015-05-19 NOTE — Assessment & Plan Note (Signed)
Emphasized weightloss and continued exercise. Hgba1c check today.
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
## 2015-05-19 NOTE — Assessment & Plan Note (Signed)
Hgb as low as 8.9- 2013, increased to 10.1 one year ago with Normal MCV,. She has declined Colonoscopy several times. She is menopausal.   Plan- CBC check today. - if low reflex to anemia panel.

## 2015-05-19 NOTE — Assessment & Plan Note (Signed)
Patient is on Xanax per medication contract- 09/2014 - 1mg  QHS. No warning or red flags. She says there was a delay getting her Xanax filled- 5 days, no sure exactly why that happened. But she says she could not sleep at night and the med really helps. She will like to continue med.   Plan- Renewed contract and went over details, She agreed and signed.  - Refilled xanax to start 06/12/2014, for #90 pills for three months.

## 2015-05-19 NOTE — Assessment & Plan Note (Signed)
Bp stable.  Plan- Cont meds- lisinopril-HCTZ- 20-12.5mg  daily. PLAn- Bmet check today.

## 2015-05-19 NOTE — Patient Instructions (Signed)
We will be presecribing short course of antibiotics for your leg. If this gets worse let us know.   Take one tablet of doxycycline two times a day for 7 days.
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
## 2015-05-20 LAB — CBC
HEMATOCRIT: 38.9 % (ref 34.0–46.6)
HEMOGLOBIN: 13 g/dL (ref 11.1–15.9)
MCH: 29.9 pg (ref 26.6–33.0)
MCHC: 33.4 g/dL (ref 31.5–35.7)
MCV: 89 fL (ref 79–97)
Platelets: 395 10*3/uL — ABNORMAL HIGH (ref 150–379)
RBC: 4.35 x10E6/uL (ref 3.77–5.28)
RDW: 14.1 % (ref 12.3–15.4)
WBC: 8.7 10*3/uL (ref 3.4–10.8)

## 2015-05-20 LAB — BMP8+ANION GAP
Anion Gap: 18 mmol/L (ref 10.0–18.0)
BUN/Creatinine Ratio: 16 (ref 11–26)
BUN: 8 mg/dL (ref 8–27)
CALCIUM: 9.9 mg/dL (ref 8.7–10.3)
CO2: 25 mmol/L (ref 18–29)
Chloride: 97 mmol/L (ref 96–106)
Creatinine, Ser: 0.5 mg/dL — ABNORMAL LOW (ref 0.57–1.00)
GFR, EST AFRICAN AMERICAN: 115 mL/min/{1.73_m2} (ref >59)
GFR, EST NON AFRICAN AMERICAN: 100 mL/min/{1.73_m2} (ref >59)
Glucose: 102 mg/dL — ABNORMAL HIGH (ref 65–99)
Potassium: 4.1 mmol/L (ref 3.5–5.2)
Sodium: 140 mmol/L (ref 134–144)

## 2015-05-25 NOTE — Progress Notes (Signed)
Internal Medicine Clinic Attending  Case discussed with Dr. Emokpae soon after the resident saw the patient.  We reviewed the resident's history and exam and pertinent patient test results.  I agree with the assessment, diagnosis, and plan of care documented in the resident's note. 

## 2015-06-07 HISTORY — PX: HIP SURGERY: SHX245

## 2015-06-14 DIAGNOSIS — Z9660 Presence of unspecified orthopedic joint implant: Secondary | ICD-10-CM | POA: Diagnosis not present

## 2015-06-14 DIAGNOSIS — D72825 Bandemia: Secondary | ICD-10-CM | POA: Diagnosis not present

## 2015-06-14 DIAGNOSIS — Z79899 Other long term (current) drug therapy: Secondary | ICD-10-CM | POA: Diagnosis not present

## 2015-06-14 DIAGNOSIS — S79911A Unspecified injury of right hip, initial encounter: Secondary | ICD-10-CM | POA: Diagnosis not present

## 2015-06-14 DIAGNOSIS — S72141A Displaced intertrochanteric fracture of right femur, initial encounter for closed fracture: Secondary | ICD-10-CM | POA: Diagnosis not present

## 2015-06-14 DIAGNOSIS — S72001A Fracture of unspecified part of neck of right femur, initial encounter for closed fracture: Secondary | ICD-10-CM | POA: Diagnosis not present

## 2015-06-14 DIAGNOSIS — M25551 Pain in right hip: Secondary | ICD-10-CM | POA: Diagnosis not present

## 2015-06-14 DIAGNOSIS — N390 Urinary tract infection, site not specified: Secondary | ICD-10-CM | POA: Diagnosis not present

## 2015-06-14 DIAGNOSIS — M810 Age-related osteoporosis without current pathological fracture: Secondary | ICD-10-CM | POA: Diagnosis not present

## 2015-06-14 DIAGNOSIS — S299XXA Unspecified injury of thorax, initial encounter: Secondary | ICD-10-CM | POA: Diagnosis not present

## 2015-06-14 DIAGNOSIS — I10 Essential (primary) hypertension: Secondary | ICD-10-CM | POA: Diagnosis not present

## 2015-06-14 DIAGNOSIS — N3000 Acute cystitis without hematuria: Secondary | ICD-10-CM | POA: Diagnosis not present

## 2015-06-14 DIAGNOSIS — Z880 Allergy status to penicillin: Secondary | ICD-10-CM | POA: Diagnosis not present

## 2015-06-14 DIAGNOSIS — R509 Fever, unspecified: Secondary | ICD-10-CM | POA: Diagnosis not present

## 2015-06-14 DIAGNOSIS — T148 Other injury of unspecified body region: Secondary | ICD-10-CM | POA: Diagnosis not present

## 2015-06-14 DIAGNOSIS — S72144A Nondisplaced intertrochanteric fracture of right femur, initial encounter for closed fracture: Secondary | ICD-10-CM | POA: Diagnosis not present

## 2015-06-14 DIAGNOSIS — J189 Pneumonia, unspecified organism: Secondary | ICD-10-CM | POA: Diagnosis not present

## 2015-06-14 DIAGNOSIS — E871 Hypo-osmolality and hyponatremia: Secondary | ICD-10-CM | POA: Diagnosis not present

## 2015-06-17 DIAGNOSIS — N3000 Acute cystitis without hematuria: Secondary | ICD-10-CM | POA: Insufficient documentation

## 2015-06-18 DIAGNOSIS — M810 Age-related osteoporosis without current pathological fracture: Secondary | ICD-10-CM | POA: Diagnosis not present

## 2015-06-18 DIAGNOSIS — I10 Essential (primary) hypertension: Secondary | ICD-10-CM | POA: Diagnosis not present

## 2015-06-18 DIAGNOSIS — S72141D Displaced intertrochanteric fracture of right femur, subsequent encounter for closed fracture with routine healing: Secondary | ICD-10-CM | POA: Diagnosis not present

## 2015-06-18 DIAGNOSIS — Z9181 History of falling: Secondary | ICD-10-CM | POA: Diagnosis not present

## 2015-06-22 DIAGNOSIS — I10 Essential (primary) hypertension: Secondary | ICD-10-CM | POA: Diagnosis not present

## 2015-06-22 DIAGNOSIS — S72141D Displaced intertrochanteric fracture of right femur, subsequent encounter for closed fracture with routine healing: Secondary | ICD-10-CM | POA: Diagnosis not present

## 2015-06-22 DIAGNOSIS — M810 Age-related osteoporosis without current pathological fracture: Secondary | ICD-10-CM | POA: Diagnosis not present

## 2015-06-22 DIAGNOSIS — Z9181 History of falling: Secondary | ICD-10-CM | POA: Diagnosis not present

## 2015-06-23 DIAGNOSIS — Z4789 Encounter for other orthopedic aftercare: Secondary | ICD-10-CM | POA: Insufficient documentation

## 2015-06-24 DIAGNOSIS — S72141D Displaced intertrochanteric fracture of right femur, subsequent encounter for closed fracture with routine healing: Secondary | ICD-10-CM | POA: Diagnosis not present

## 2015-06-24 DIAGNOSIS — I10 Essential (primary) hypertension: Secondary | ICD-10-CM | POA: Diagnosis not present

## 2015-06-24 DIAGNOSIS — M810 Age-related osteoporosis without current pathological fracture: Secondary | ICD-10-CM | POA: Diagnosis not present

## 2015-06-24 DIAGNOSIS — Z9181 History of falling: Secondary | ICD-10-CM | POA: Diagnosis not present

## 2015-06-29 DIAGNOSIS — Z96651 Presence of right artificial knee joint: Secondary | ICD-10-CM | POA: Diagnosis not present

## 2015-07-14 ENCOUNTER — Emergency Department (HOSPITAL_COMMUNITY): Payer: Commercial Managed Care - HMO

## 2015-07-14 ENCOUNTER — Emergency Department (HOSPITAL_COMMUNITY)
Admission: EM | Admit: 2015-07-14 | Discharge: 2015-07-14 | Disposition: A | Discharge status: Home / Self Care | Payer: Commercial Managed Care - HMO | Attending: Emergency Medicine | Admitting: Emergency Medicine

## 2015-07-14 ENCOUNTER — Encounter (HOSPITAL_COMMUNITY): Payer: Self-pay

## 2015-07-14 DIAGNOSIS — Z8619 Personal history of other infectious and parasitic diseases: Secondary | ICD-10-CM | POA: Insufficient documentation

## 2015-07-14 DIAGNOSIS — Z8739 Personal history of other diseases of the musculoskeletal system and connective tissue: Secondary | ICD-10-CM | POA: Diagnosis not present

## 2015-07-14 DIAGNOSIS — G8929 Other chronic pain: Secondary | ICD-10-CM | POA: Diagnosis not present

## 2015-07-14 DIAGNOSIS — M1711 Unilateral primary osteoarthritis, right knee: Secondary | ICD-10-CM | POA: Diagnosis not present

## 2015-07-14 DIAGNOSIS — Z9889 Other specified postprocedural states: Secondary | ICD-10-CM | POA: Insufficient documentation

## 2015-07-14 DIAGNOSIS — F329 Major depressive disorder, single episode, unspecified: Secondary | ICD-10-CM | POA: Diagnosis not present

## 2015-07-14 DIAGNOSIS — Y998 Other external cause status: Secondary | ICD-10-CM | POA: Insufficient documentation

## 2015-07-14 DIAGNOSIS — M25551 Pain in right hip: Secondary | ICD-10-CM | POA: Diagnosis not present

## 2015-07-14 DIAGNOSIS — X58XXXA Exposure to other specified factors, initial encounter: Secondary | ICD-10-CM | POA: Diagnosis not present

## 2015-07-14 DIAGNOSIS — S79911A Unspecified injury of right hip, initial encounter: Secondary | ICD-10-CM | POA: Insufficient documentation

## 2015-07-14 DIAGNOSIS — M25559 Pain in unspecified hip: Secondary | ICD-10-CM

## 2015-07-14 DIAGNOSIS — K219 Gastro-esophageal reflux disease without esophagitis: Secondary | ICD-10-CM | POA: Insufficient documentation

## 2015-07-14 DIAGNOSIS — Y9289 Other specified places as the place of occurrence of the external cause: Secondary | ICD-10-CM | POA: Diagnosis not present

## 2015-07-14 DIAGNOSIS — Z880 Allergy status to penicillin: Secondary | ICD-10-CM | POA: Insufficient documentation

## 2015-07-14 DIAGNOSIS — Y9301 Activity, walking, marching and hiking: Secondary | ICD-10-CM | POA: Insufficient documentation

## 2015-07-14 DIAGNOSIS — Z79899 Other long term (current) drug therapy: Secondary | ICD-10-CM | POA: Insufficient documentation

## 2015-07-14 DIAGNOSIS — S8991XA Unspecified injury of right lower leg, initial encounter: Secondary | ICD-10-CM | POA: Diagnosis not present

## 2015-07-14 DIAGNOSIS — I10 Essential (primary) hypertension: Secondary | ICD-10-CM | POA: Insufficient documentation

## 2015-07-14 DIAGNOSIS — M25561 Pain in right knee: Secondary | ICD-10-CM | POA: Diagnosis not present

## 2015-07-14 MED ORDER — OXYCODONE-ACETAMINOPHEN 5-325 MG PO TABS
1.0000 | ORAL_TABLET | Freq: Once | ORAL | Status: AC
  Administered 2015-07-14: 1 via ORAL
  Filled 2015-07-14: qty 1

## 2015-07-14 MED ORDER — OXYCODONE-ACETAMINOPHEN 5-325 MG PO TABS: 2.0000 | ORAL_TABLET | ORAL | Status: DC | PRN

## 2015-07-14 NOTE — ED Notes (Addendum)
Pt had hip surgery x 4 weeks.  Pain continues from hip to ankle.  Felt pop yesterday. Difficulty walking.  Procedure out of town.  Papers with patient.
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
## 2015-07-14 NOTE — Discharge Instructions (Signed)
Cryotherapy °Cryotherapy means treatment with cold. Ice or gel packs can be used to reduce both pain and swelling. Ice is the most helpful within the first 24 to 48 hours after an injury or flare-up from overusing a muscle or joint. Sprains, strains, spasms, burning pain, shooting pain, and aches can all be eased with ice. Ice can also be used when recovering from surgery. Ice is effective, has very few side effects, and is safe for most people to use. °PRECAUTIONS  °Ice is not a safe treatment option for people with: °· Raynaud phenomenon. This is a condition affecting small blood vessels in the extremities. Exposure to cold may cause your problems to return. °· Cold hypersensitivity. There are many forms of cold hypersensitivity, including: °¨ Cold urticaria. Red, itchy hives appear on the skin when the tissues begin to warm after being iced. °¨ Cold erythema. This is a red, itchy rash caused by exposure to cold. °¨ Cold hemoglobinuria. Red blood cells break down when the tissues begin to warm after being iced. The hemoglobin that carry oxygen are passed into the urine because they cannot combine with blood proteins fast enough. °· Numbness or altered sensitivity in the area being iced. °If you have any of the following conditions, do not use ice until you have discussed cryotherapy with your caregiver: °· Heart conditions, such as arrhythmia, angina, or chronic heart disease. °· High blood pressure. °· Healing wounds or open skin in the area being iced. °· Current infections. °· Rheumatoid arthritis. °· Poor circulation. °· Diabetes. °Ice slows the blood flow in the region it is applied. This is beneficial when trying to stop inflamed tissues from spreading irritating chemicals to surrounding tissues. However, if you expose your skin to cold temperatures for too long or without the proper protection, you can damage your skin or nerves. Watch for signs of skin damage due to cold. °HOME CARE INSTRUCTIONS °Follow  these tips to use ice and cold packs safely. °· Place a dry or damp towel between the ice and skin. A damp towel will cool the skin more quickly, so you may need to shorten the time that the ice is used. °· For a more rapid response, add gentle compression to the ice. °· Ice for no more than 10 to 20 minutes at a time. The bonier the area you are icing, the less time it will take to get the benefits of ice. °· Check your skin after 5 minutes to make sure there are no signs of a poor response to cold or skin damage. °· Rest 20 minutes or more between uses. °· Once your skin is numb, you can end your treatment. You can test numbness by very lightly touching your skin. The touch should be so light that you do not see the skin dimple from the pressure of your fingertip. When using ice, most people will feel these normal sensations in this order: cold, burning, aching, and numbness. °· Do not use ice on someone who cannot communicate their responses to pain, such as small children or people with dementia. °HOW TO MAKE AN ICE PACK °Ice packs are the most common way to use ice therapy. Other methods include ice massage, ice baths, and cryosprays. Muscle creams that cause a cold, tingly feeling do not offer the same benefits that ice offers and should not be used as a substitute unless recommended by your caregiver. °To make an ice pack, do one of the following: °· Place crushed ice or a   bag of frozen vegetables in a sealable plastic bag. Squeeze out the excess air. Place this bag inside another plastic bag. Slide the bag into a pillowcase or place a damp towel between your skin and the bag.  Mix 3 parts water with 1 part rubbing alcohol. Freeze the mixture in a sealable plastic bag. When you remove the mixture from the freezer, it will be slushy. Squeeze out the excess air. Place this bag inside another plastic bag. Slide the bag into a pillowcase or place a damp towel between your skin and the bag. SEEK MEDICAL CARE  IF:  You develop white spots on your skin. This may give the skin a blotchy (mottled) appearance.  Your skin turns blue or pale.  Your skin becomes waxy or hard.  Your swelling gets worse. MAKE SURE YOU:   Understand these instructions.  Will watch your condition.  Will get help right away if you are not doing well or get worse.   This information is not intended to replace advice given to you by your health care provider. Make sure you discuss any questions you have with your health care provider.   Follow-up with Dr. Len Childs in 1 week, keep scheduled appointment. May take home tramadol and Percocet as needed for pain. Apply ice to affected area. Continue nonweightbearing. Return to the emergency department if you experience severe increase in her pain, numbness or tingling in your extremity, lower extremity swelling, redness, warmth, fever, chills, , chest pain, shortness of breath.

## 2015-07-21 DIAGNOSIS — Z4789 Encounter for other orthopedic aftercare: Secondary | ICD-10-CM | POA: Diagnosis not present

## 2015-07-22 ENCOUNTER — Other Ambulatory Visit: Payer: Self-pay | Admitting: Internal Medicine

## 2015-07-22 DIAGNOSIS — S72142S Displaced intertrochanteric fracture of left femur, sequela: Secondary | ICD-10-CM

## 2015-07-27 ENCOUNTER — Other Ambulatory Visit: Payer: Self-pay | Admitting: Physician Assistant

## 2015-07-27 DIAGNOSIS — M25551 Pain in right hip: Secondary | ICD-10-CM | POA: Diagnosis not present

## 2015-07-30 NOTE — Pre-Procedure Instructions (Signed)
Lori Bradford  07/30/2015     Your procedure is scheduled on : Tuesday August 04, 2015 at 2:25 PM.  Report to [facility] Admitting at 12:25 PM.  Call this number if you have problems the morning of surgery: [phone]    Remember:  Do not eat food or drink liquids after midnight.  Take these medicines the morning of surgery with A SIP OF WATER : Hydrocodone if needed, Omeprazole (Prilosec), Tramadol if needed   Stop taking any vitamins, herbal medications/supplements, Ibuprofen, Advil, Motrin, Aleve, etc today   Do not wear jewelry, make-up or nail polish.  Do not wear lotions, powders, or perfumes.    Do not shave 48 hours prior to surgery.    Do not bring valuables to the hospital.  [facility] is not responsible for any belongings or valuables.  Contacts, dentures or bridgework may not be worn into surgery.  Leave your suitcase in the car.  After surgery it may be brought to your room.  For patients admitted to the hospital, discharge time will be determined by your treatment team.  Patients discharged the day of surgery will not be allowed to drive home.   Name and phone number of your driver:    Special instructions:  Shower using CHG soap the night before and the morning of your surgery  Please read over the following fact sheets that you were given. Pain Booklet, Coughing and Deep Breathing, Blood Transfusion Information, Total Joint Packet, MRSA Information and Surgical Site Infection Prevention

## 2015-07-31 ENCOUNTER — Observation Stay (HOSPITAL_COMMUNITY)
Admission: EM | Admit: 2015-07-31 | Discharge: 2015-08-01 | Disposition: A | Discharge status: Home / Self Care | Payer: Commercial Managed Care - HMO | Attending: Internal Medicine | Admitting: Internal Medicine

## 2015-07-31 ENCOUNTER — Telehealth: Payer: Self-pay | Admitting: Internal Medicine

## 2015-07-31 ENCOUNTER — Other Ambulatory Visit: Payer: Self-pay

## 2015-07-31 ENCOUNTER — Encounter (HOSPITAL_COMMUNITY): Payer: Self-pay

## 2015-07-31 ENCOUNTER — Other Ambulatory Visit (HOSPITAL_COMMUNITY): Payer: Self-pay

## 2015-07-31 ENCOUNTER — Encounter (HOSPITAL_COMMUNITY): Payer: Self-pay | Admitting: Emergency Medicine

## 2015-07-31 ENCOUNTER — Encounter (HOSPITAL_COMMUNITY)
Admission: RE | Admit: 2015-07-31 | Discharge: 2015-07-31 | Disposition: A | Discharge status: Home / Self Care | Payer: Commercial Managed Care - HMO | Source: Ambulatory Visit | Attending: Orthopaedic Surgery | Admitting: Orthopaedic Surgery

## 2015-07-31 DIAGNOSIS — E876 Hypokalemia: Principal | ICD-10-CM | POA: Diagnosis present

## 2015-07-31 DIAGNOSIS — E878 Other disorders of electrolyte and fluid balance, not elsewhere classified: Secondary | ICD-10-CM | POA: Diagnosis not present

## 2015-07-31 DIAGNOSIS — F418 Other specified anxiety disorders: Secondary | ICD-10-CM | POA: Diagnosis not present

## 2015-07-31 DIAGNOSIS — E669 Obesity, unspecified: Secondary | ICD-10-CM | POA: Diagnosis not present

## 2015-07-31 DIAGNOSIS — M8580 Other specified disorders of bone density and structure, unspecified site: Secondary | ICD-10-CM | POA: Diagnosis not present

## 2015-07-31 DIAGNOSIS — M25551 Pain in right hip: Secondary | ICD-10-CM | POA: Diagnosis not present

## 2015-07-31 DIAGNOSIS — F341 Dysthymic disorder: Secondary | ICD-10-CM | POA: Diagnosis present

## 2015-07-31 DIAGNOSIS — E871 Hypo-osmolality and hyponatremia: Secondary | ICD-10-CM | POA: Diagnosis not present

## 2015-07-31 DIAGNOSIS — Z7982 Long term (current) use of aspirin: Secondary | ICD-10-CM | POA: Insufficient documentation

## 2015-07-31 DIAGNOSIS — Z6838 Body mass index (BMI) 38.0-38.9, adult: Secondary | ICD-10-CM | POA: Insufficient documentation

## 2015-07-31 DIAGNOSIS — K219 Gastro-esophageal reflux disease without esophagitis: Secondary | ICD-10-CM | POA: Diagnosis not present

## 2015-07-31 DIAGNOSIS — Z96653 Presence of artificial knee joint, bilateral: Secondary | ICD-10-CM | POA: Diagnosis not present

## 2015-07-31 DIAGNOSIS — I10 Essential (primary) hypertension: Secondary | ICD-10-CM | POA: Diagnosis present

## 2015-07-31 DIAGNOSIS — N179 Acute kidney failure, unspecified: Secondary | ICD-10-CM | POA: Diagnosis not present

## 2015-07-31 DIAGNOSIS — Z79899 Other long term (current) drug therapy: Secondary | ICD-10-CM | POA: Insufficient documentation

## 2015-07-31 DIAGNOSIS — G8929 Other chronic pain: Secondary | ICD-10-CM | POA: Diagnosis not present

## 2015-07-31 LAB — CBC
HEMATOCRIT: 32.2 % — AB (ref 36.0–46.0)
Hemoglobin: 10.6 g/dL — ABNORMAL LOW (ref 12.0–15.0)
MCH: 27.8 pg (ref 26.0–34.0)
MCHC: 32.9 g/dL (ref 30.0–36.0)
MCV: 84.5 fL (ref 78.0–100.0)
PLATELETS: 420 10*3/uL — AB (ref 150–400)
RBC: 3.81 MIL/uL — ABNORMAL LOW (ref 3.87–5.11)
RDW: 13 % (ref 11.5–15.5)
WBC: 9.1 10*3/uL (ref 4.0–10.5)

## 2015-07-31 LAB — COMPREHENSIVE METABOLIC PANEL
ALBUMIN: 3.5 g/dL (ref 3.5–5.0)
ALT: 17 U/L (ref 14–54)
AST: 24 U/L (ref 15–41)
Alkaline Phosphatase: 106 U/L (ref 38–126)
Anion gap: 19 — ABNORMAL HIGH (ref 5–15)
BUN: 17 mg/dL (ref 6–20)
CHLORIDE: 83 mmol/L — AB (ref 101–111)
CO2: 25 mmol/L (ref 22–32)
CREATININE: 0.96 mg/dL (ref 0.44–1.00)
Calcium: 9.8 mg/dL (ref 8.9–10.3)
GFR calc Af Amer: >60 mL/min (ref >60)
GFR calc non Af Amer: 59 mL/min — ABNORMAL LOW (ref >60)
GLUCOSE: 109 mg/dL — AB (ref 65–99)
POTASSIUM: 2.7 mmol/L — AB (ref 3.5–5.1)
SODIUM: 127 mmol/L — AB (ref 135–145)
Total Bilirubin: 0.8 mg/dL (ref 0.3–1.2)
Total Protein: 7 g/dL (ref 6.5–8.1)

## 2015-07-31 LAB — BASIC METABOLIC PANEL
ANION GAP: 17 — AB (ref 5–15)
BUN: 16 mg/dL (ref 6–20)
CALCIUM: 9.6 mg/dL (ref 8.9–10.3)
CO2: 24 mmol/L (ref 22–32)
Chloride: 85 mmol/L — ABNORMAL LOW (ref 101–111)
Creatinine, Ser: 0.92 mg/dL (ref 0.44–1.00)
GFR calc Af Amer: >60 mL/min (ref >60)
GLUCOSE: 114 mg/dL — AB (ref 65–99)
POTASSIUM: 2.8 mmol/L — AB (ref 3.5–5.1)
Sodium: 126 mmol/L — ABNORMAL LOW (ref 135–145)

## 2015-07-31 LAB — CBC WITH DIFFERENTIAL/PLATELET
BASOS ABS: 0 10*3/uL (ref 0.0–0.1)
BASOS PCT: 0 %
EOS PCT: 2 %
Eosinophils Absolute: 0.2 10*3/uL (ref 0.0–0.7)
HCT: 31.8 % — ABNORMAL LOW (ref 36.0–46.0)
Hemoglobin: 11.1 g/dL — ABNORMAL LOW (ref 12.0–15.0)
Lymphocytes Relative: 23 %
Lymphs Abs: 2.2 10*3/uL (ref 0.7–4.0)
MCH: 29.1 pg (ref 26.0–34.0)
MCHC: 34.9 g/dL (ref 30.0–36.0)
MCV: 83.5 fL (ref 78.0–100.0)
MONO ABS: 0.6 10*3/uL (ref 0.1–1.0)
Monocytes Relative: 6 %
Neutro Abs: 6.7 10*3/uL (ref 1.7–7.7)
Neutrophils Relative %: 69 %
PLATELETS: 439 10*3/uL — AB (ref 150–400)
RBC: 3.81 MIL/uL — AB (ref 3.87–5.11)
RDW: 13.1 % (ref 11.5–15.5)
WBC: 9.8 10*3/uL (ref 4.0–10.5)

## 2015-07-31 LAB — URINALYSIS, ROUTINE W REFLEX MICROSCOPIC
Bilirubin Urine: NEGATIVE
GLUCOSE, UA: NEGATIVE mg/dL
Hgb urine dipstick: NEGATIVE
Ketones, ur: NEGATIVE mg/dL
LEUKOCYTES UA: NEGATIVE
Nitrite: NEGATIVE
PROTEIN: NEGATIVE mg/dL
SPECIFIC GRAVITY, URINE: 1.008 (ref 1.005–1.030)
pH: 5.5 (ref 5.0–8.0)

## 2015-07-31 LAB — MAGNESIUM: MAGNESIUM: 1.7 mg/dL (ref 1.7–2.4)

## 2015-07-31 LAB — SURGICAL PCR SCREEN
MRSA, PCR: NEGATIVE
Staphylococcus aureus: NEGATIVE

## 2015-07-31 LAB — ABO/RH: ABO/RH(D): A POS

## 2015-07-31 MED ORDER — POTASSIUM CHLORIDE CRYS ER 20 MEQ PO TBCR
40.0000 meq | EXTENDED_RELEASE_TABLET | ORAL | Status: DC
  Administered 2015-08-01 (×2): 40 meq via ORAL
  Filled 2015-07-31 (×2): qty 2

## 2015-07-31 MED ORDER — OXYCODONE-ACETAMINOPHEN 5-325 MG PO TABS
1.0000 | ORAL_TABLET | Freq: Once | ORAL | Status: AC
  Administered 2015-07-31: 1 via ORAL
  Filled 2015-07-31: qty 1

## 2015-07-31 MED ORDER — POTASSIUM CHLORIDE 10 MEQ/100ML IV SOLN
10.0000 meq | INTRAVENOUS | Status: AC
  Administered 2015-07-31 (×2): 10 meq via INTRAVENOUS
  Filled 2015-07-31 (×2): qty 100

## 2015-07-31 MED ORDER — POTASSIUM CHLORIDE CRYS ER 20 MEQ PO TBCR: 40.0000 meq | EXTENDED_RELEASE_TABLET | ORAL | Status: DC

## 2015-07-31 MED ORDER — POTASSIUM CHLORIDE CRYS ER 20 MEQ PO TBCR
40.0000 meq | EXTENDED_RELEASE_TABLET | Freq: Once | ORAL | Status: AC
  Administered 2015-07-31: 40 meq via ORAL
  Filled 2015-07-31: qty 2

## 2015-07-31 MED ORDER — SODIUM CHLORIDE 0.9 % IV SOLN
Freq: Once | INTRAVENOUS | Status: AC
  Administered 2015-07-31: 20:00:00 via INTRAVENOUS
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
## 2015-07-31 NOTE — Telephone Encounter (Signed)
Since we have had abnl Na results recently and these results are unexpected for her, I asked lab to rerun the sample - Na 127 and K 2.8 original results accurate. Called pt before results in - no V/D.Not eating much 2/2 pain. Ate biscuit and sausage and gravy this AM. Drinking mostly water and ocean spray. New med omeprazole (<1% incidence of hyponatremia) but not taking it bc made her feel unwell. Out of the hydrocodone the ED Rx'd. Is on HCTZ but long time med.  Called pt after the repeat results in - got answering machine. Will rec that pt come to ED so that W/U can be started and electrolyte abnl fixed so that surgery on 18th not delayed.

## 2015-07-31 NOTE — Telephone Encounter (Signed)
Lekea from Pre-Admission requesting the nurse to call back regarding pt potassium 2.8 and sodium 1.26. Please call back @ [phone].

## 2015-07-31 NOTE — Progress Notes (Signed)
Nurse called [facility] and spoke with receptionist informing her of patients BMET results. Receptionist stated that the Nurses had not returned from lunch, however she would give them the message, and have them return call. Direct callback number left.

## 2015-07-31 NOTE — Progress Notes (Signed)
Patient arrived to PAT via wheelchair with her Ex Mauri [place], and appeared to be in a great deal of pain. Patient rated right hip pain as a "9" out of 10.  Patient denied having any acute cardiac or pulmonary issues

## 2015-07-31 NOTE — ED Notes (Signed)
Pt here for low sodium and potassium; pt supposed to have hip sx next week and needs correction of labs in order to have sx

## 2015-07-31 NOTE — Telephone Encounter (Signed)
Na 126 Chloride 85 K+ 2.8 hbg 10.6  Pre-op appointment, no fluids given, pt from home Surgery with Dr. Rush Farmer scheduled Tuesday.     Please advise.

## 2015-07-31 NOTE — Telephone Encounter (Signed)
Got in touch with pt at 3:23. She is willing to go to ED at [facility]. She wants to ensure her surgery is not delayed. I called ED triage to update them.

## 2015-07-31 NOTE — ED Notes (Addendum)
Pt being sent d/t abnormal pre-op labs.  Butcher MD sts labs reflected hyponatremia and hypokalemia.

## 2015-07-31 NOTE — Progress Notes (Signed)
Sherrie from Dr. Trevor Mace office returned Nurses call and stated she called patient and left a voicemail instructing patient to go to pharmacy and pick up prescription for potassium. Nurse informed Sherrie that according to notes in EPIC her PCP's office advised her to go to the [facility] ED. Sherrie stated that Nurse should have called Dr. Trevor Mace office back again before calling patients PCP office. Nurse explained to [employer] that a voicemail was left for her to return call, and that patients PCP's office was contacted shortly thereafter because of the BMET results. Sherrie stated that it would be confusing for the patient since she had just left a voicemail instructing her to go to the pharmacy to pick up medication. Nurse explained to [facility] that I was only trying to get in touch with someone since I could not get in touch with her. Call ended.

## 2015-07-31 NOTE — Progress Notes (Signed)
Truman Hayward from Internal Medicine returned call and Nurse informed her of patients lab results. Truman Hayward stated she would inform a Physician of this and get in touch with patient.

## 2015-07-31 NOTE — Progress Notes (Signed)
Nurse called and left a voicemail with Sherrie at Dr. Trevor Mace office informing her of BMET results. Direct call back number left. Nurse then called [facility], however they were closed for lunch and will reopen at 1300. Will call back after 1300.

## 2015-08-01 DIAGNOSIS — I10 Essential (primary) hypertension: Secondary | ICD-10-CM

## 2015-08-01 DIAGNOSIS — E871 Hypo-osmolality and hyponatremia: Secondary | ICD-10-CM | POA: Diagnosis not present

## 2015-08-01 DIAGNOSIS — F419 Anxiety disorder, unspecified: Secondary | ICD-10-CM

## 2015-08-01 DIAGNOSIS — E876 Hypokalemia: Secondary | ICD-10-CM | POA: Diagnosis not present

## 2015-08-01 DIAGNOSIS — M25551 Pain in right hip: Secondary | ICD-10-CM | POA: Diagnosis not present

## 2015-08-01 DIAGNOSIS — N179 Acute kidney failure, unspecified: Secondary | ICD-10-CM | POA: Diagnosis not present

## 2015-08-01 LAB — BASIC METABOLIC PANEL
Anion gap: 13 (ref 5–15)
Anion gap: 10 (ref 5–15)
Anion gap: 10 (ref 5–15)
BUN: 13 mg/dL (ref 6–20)
BUN: 12 mg/dL (ref 6–20)
BUN: 8 mg/dL (ref 6–20)
CALCIUM: 9 mg/dL (ref 8.9–10.3)
CALCIUM: 9 mg/dL (ref 8.9–10.3)
CALCIUM: 9.1 mg/dL (ref 8.9–10.3)
CHLORIDE: 93 mmol/L — AB (ref 101–111)
CHLORIDE: 98 mmol/L — AB (ref 101–111)
CO2: 23 mmol/L (ref 22–32)
CO2: 25 mmol/L (ref 22–32)
CO2: 26 mmol/L (ref 22–32)
CREATININE: 0.87 mg/dL (ref 0.44–1.00)
CREATININE: 0.68 mg/dL (ref 0.44–1.00)
CREATININE: 0.54 mg/dL (ref 0.44–1.00)
Chloride: 98 mmol/L — ABNORMAL LOW (ref 101–111)
GFR calc Af Amer: >60 mL/min (ref >60)
GFR calc Af Amer: >60 mL/min (ref >60)
GFR calc non Af Amer: >60 mL/min (ref >60)
GFR calc non Af Amer: >60 mL/min (ref >60)
Glucose, Bld: 148 mg/dL — ABNORMAL HIGH (ref 65–99)
Glucose, Bld: 101 mg/dL — ABNORMAL HIGH (ref 65–99)
Glucose, Bld: 122 mg/dL — ABNORMAL HIGH (ref 65–99)
Potassium: 2.8 mmol/L — ABNORMAL LOW (ref 3.5–5.1)
Potassium: 3.2 mmol/L — ABNORMAL LOW (ref 3.5–5.1)
Potassium: 4.1 mmol/L (ref 3.5–5.1)
SODIUM: 129 mmol/L — AB (ref 135–145)
SODIUM: 134 mmol/L — AB (ref 135–145)
Sodium: 133 mmol/L — ABNORMAL LOW (ref 135–145)

## 2015-08-01 LAB — OSMOLALITY, URINE: OSMOLALITY UR: 188 mosm/kg — AB (ref 300–900)

## 2015-08-01 LAB — SODIUM, URINE, RANDOM: SODIUM UR: 16 mmol/L

## 2015-08-01 LAB — OSMOLALITY: Osmolality: 273 mOsm/kg — ABNORMAL LOW (ref 275–295)

## 2015-08-01 MED ORDER — POTASSIUM CHLORIDE CRYS ER 20 MEQ PO TBCR
40.0000 meq | EXTENDED_RELEASE_TABLET | Freq: Once | ORAL | Status: DC
  Filled 2015-08-01: qty 2

## 2015-08-01 MED ORDER — ONDANSETRON HCL 4 MG/2ML IJ SOLN: 4.0000 mg | Freq: Four times a day (QID) | INTRAMUSCULAR | Status: DC | PRN

## 2015-08-01 MED ORDER — LISINOPRIL 20 MG PO TABS
20.0000 mg | ORAL_TABLET | Freq: Every day | ORAL | Status: DC
  Administered 2015-08-01: 20 mg via ORAL
  Filled 2015-08-01: qty 1

## 2015-08-01 MED ORDER — ENOXAPARIN SODIUM 40 MG/0.4ML ~~LOC~~ SOLN
40.0000 mg | SUBCUTANEOUS | Status: DC
  Administered 2015-08-01: 40 mg via SUBCUTANEOUS
  Filled 2015-08-01 (×2): qty 0.4

## 2015-08-01 MED ORDER — LISINOPRIL 20 MG PO TABS: 20.0000 mg | ORAL_TABLET | Freq: Every day | ORAL | Status: DC

## 2015-08-01 MED ORDER — MAGNESIUM SULFATE 2 GM/50ML IV SOLN
2.0000 g | Freq: Once | INTRAVENOUS | Status: AC
  Administered 2015-08-01: 2 g via INTRAVENOUS
  Filled 2015-08-01: qty 50

## 2015-08-01 MED ORDER — ASPIRIN EC 81 MG PO TBEC
81.0000 mg | DELAYED_RELEASE_TABLET | Freq: Every day | ORAL | Status: DC
  Administered 2015-08-01: 81 mg via ORAL
  Filled 2015-08-01: qty 1

## 2015-08-01 MED ORDER — MORPHINE SULFATE (PF) 2 MG/ML IV SOLN
2.0000 mg | INTRAVENOUS | Status: DC | PRN
  Administered 2015-08-01 (×2): 2 mg via INTRAVENOUS
  Filled 2015-08-01 (×2): qty 1

## 2015-08-01 MED ORDER — OXYCODONE-ACETAMINOPHEN 5-325 MG PO TABS
1.0000 | ORAL_TABLET | ORAL | Status: DC | PRN
  Administered 2015-08-01: 2 via ORAL
  Filled 2015-08-01: qty 2

## 2015-08-01 MED ORDER — ALPRAZOLAM 0.5 MG PO TABS
1.0000 mg | ORAL_TABLET | Freq: Every evening | ORAL | Status: DC | PRN
  Administered 2015-08-01: 1 mg via ORAL
  Filled 2015-08-01: qty 2

## 2015-08-01 MED ORDER — OXYCODONE-ACETAMINOPHEN 5-325 MG PO TABS: 1.0000 | ORAL_TABLET | ORAL | Status: DC | PRN

## 2015-08-01 MED ORDER — OXYCODONE-ACETAMINOPHEN 5-325 MG PO TABS: 2.0000 | ORAL_TABLET | Freq: Four times a day (QID) | ORAL | Status: DC | PRN

## 2015-08-01 MED ORDER — POTASSIUM CHLORIDE 10 MEQ/100ML IV SOLN
10.0000 meq | INTRAVENOUS | Status: DC
  Administered 2015-08-01 (×2): 10 meq via INTRAVENOUS
  Filled 2015-08-01 (×3): qty 100

## 2015-08-01 MED ORDER — ONDANSETRON HCL 4 MG PO TABS: 4.0000 mg | ORAL_TABLET | Freq: Four times a day (QID) | ORAL | Status: DC | PRN

## 2015-08-01 MED ORDER — SODIUM CHLORIDE 0.9 % IV SOLN
INTRAVENOUS | Status: DC
  Administered 2015-08-01: 02:00:00 via INTRAVENOUS

## 2015-08-01 MED ORDER — DOCUSATE SODIUM 100 MG PO CAPS: 100.0000 mg | ORAL_CAPSULE | Freq: Two times a day (BID) | ORAL | Status: DC | PRN
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
## 2015-08-01 NOTE — Discharge Summary (Signed)
Name: Lori Bradford MRN: [id number] DOB: 1945-08-01 70 y.o. PCP: Bethena Roys, MD  Date of Admission: 07/31/2015  6:19 PM Date of Discharge: 08/01/2015 Attending Physician: Aldine Contes, MD  Discharge Diagnosis:  Principal Problem:   Hypokalemia Active Problems:   ANXIETY DEPRESSION   Essential hypertension   Hyponatremia   AKI (acute kidney injury) ([place])   Hypomagnesemia  Discharge Medications:   Medication List    STOP taking these medications        cyclobenzaprine 5 MG tablet  Commonly known as:  FLEXERIL     ibuprofen 600 MG tablet  Commonly known as:  ADVIL,MOTRIN     lisinopril-hydrochlorothiazide 20-12.5 MG tablet  Commonly known as:  PRINZIDE,ZESTORETIC     traMADol 50 MG tablet  Commonly known as:  ULTRAM      TAKE these medications        ALPRAZolam 1 MG tablet  Commonly known as:  XANAX  Take 1 tablet (1 mg total) by mouth at bedtime.     aspirin EC 81 MG tablet  Take 81 mg by mouth daily.     DSS 100 MG Caps  Take 100 mg by mouth 2 (two) times daily.     lisinopril 20 MG tablet  Commonly known as:  PRINIVIL,ZESTRIL  Take 1 tablet (20 mg total) by mouth daily.     omeprazole 20 MG capsule  Commonly known as:  PRILOSEC  TAKE ONE CAPSULE BY MOUTH ONCE DAILY     oxyCODONE-acetaminophen 5-325 MG tablet  Commonly known as:  PERCOCET/ROXICET  Take 2 tablets by mouth every 6 (six) hours as needed for severe pain.        Disposition and follow-up:   Lori Bradford was discharged from [facility] in Good condition.  At the hospital follow up visit please address:  Hypokalemia/hyponatremia/hypochloremia: Please recheck BMET.  HTN: Please assess BP control while on Lisinopril 20 mg daily (recently stopped HCTZ). May need to increase lisinopril 40 daily.   2.  Labs / imaging needed at time of follow-up: BMET  3.  Pending labs/ test needing follow-up: None  Follow-up Appointments: Follow-up Information    Schedule an appointment as soon as possible for a visit with Jenetta Downer, MD.   Specialty:  Internal Medicine   Why:  1-2 weeks for hospital follow up   Contact information:   [address] [place] [address] [phone]       Discharge Instructions: Discharge Instructions    Diet - low sodium heart healthy    Complete by:  As directed      Increase activity slowly    Complete by:  As directed            Consultations:    Procedures Performed:  Dg Knee Complete 4 Views Right  07/14/2015  CLINICAL DATA:  70 year old female with increasing right hip and knee pain acute onset yesterday while walking. Hip surgery 1 month ago at [facility]. Initial encounter. EXAM: RIGHT KNEE - COMPLETE 4+ VIEW COMPARISON:  [facility] intraoperative images of the right hip 06/14/2015. Right knee series 10 30 15. FINDINGS: Partially visible right femur intra medullary rod, the distal aspect appears intact. Chronic right total knee arthroplasty changes. Knee hardware appears stable and intact. Increased callus and dystrophic calcification about the right knee since 2015. No acute osseous abnormality identified. IMPRESSION: Postoperative changes to the right femur and knee with no acute osseous abnormality identified. Electronically Signed  By: Genevie Ann M.D.   On: 07/14/2015 11:48   Dg Hip Unilat With Pelvis Min 4 Views Right  07/14/2015  CLINICAL DATA:  Worsening of right hip and knee pain, felt pop while walking yesterday EXAM: DG HIP (WITH OR WITHOUT PELVIS) 4+V RIGHT COMPARISON:  Right hip films of 06/14/2015 FINDINGS: Intramedullary nail and screw fixation of the right intertrochanteric femoral fracture has been performed in the interval. There is slight malalignment of the femoral head neck with the proximal femur on the frontal view. Some callus formation also is noted in the soft tissues just medial to the right femoral neck. The pelvic rami are intact. The left hip  joint is unremarkable. The SI joints appear corticated. There does appear to be degenerative change at the L5-S1 level. IMPRESSION: 1. Slight malalignment of the right femoral head neck with the proximal femur after IM rod and nail fixation with some callus formation. 2. No acute abnormality is seen. Electronically Signed   By: Ivar Drape M.D.   On: 07/14/2015 11:58   Admission HPI: Patient is a 70 yo F with a PMHx of HTN, GERD, osteopenia, degenerative arthritis presenting to the hospital for further evaluation of abnormal labs. Patient fractured her R hip after a fall from slipping on ice s/p intamed implant with screws on 06/14/15. She is scheduled for removal of hardware from her right hip on 08/04/15 and went to her see her anesthesiologist today for pre-admission testing where she was found to be hyponatremic (Na 126), hypochloremic (Cl 85), and hypokalemic (K 2.8). Labs were called into the PCPs office and they recommended patient go to the hospital. Patient reports having decreased appetite and weight loss (>20 lbs) in the past 8 weeks due to significant hip pain. However, continues to drink plenty of fluids (6-8 bottles of water/ day) to relieve dry mouth. Reports having nausea and occasional GERD symptoms. She is currently on Omeprazole but reports taking the medication only on occasion. Denies having any vomiting or abdominal pain. Denies having CP, SOB, confusion, headaches, or dizziness. No other complaints.  Upon admission to ED, vitals were stable. Labs showing Na 127, K 2.7, Cl 83, and anion gap 19.   Hospital Course by problem list: Principal Problem:   Hypokalemia Active Problems:   ANXIETY DEPRESSION   Essential hypertension   Hyponatremia   AKI (acute kidney injury) ([place])   Hypomagnesemia   Patient is a 70 yo F with a PMHx of HTN, GERD, osteopenia, degenerative arthritis presenting to the hospital for further evaluation of abnormal labs.   Hypokalemia: Potassium 2.7 on  admission, without signs of EKG changes. Likely secondary to continued HCTZ use in the setting of decreased po intake and mildly decreased magnesium level. Potassium improved to 4.1 after repletion. HCTZ discontinued on discharge, Lisinopril continued.  Hypochloremia: Cl improved from 85 to 98 this morning after IVF and holding HCTZ. HCTZ discontinued at discharge.  Acute Hypovolemic Hyponatremia: Asymptomatic. Urine Osm 273, Urine Sodium low at 16, and Urine Osmolality low at 188, consistent with hypovolemia. Sodium improved from 126 to 134 this morning after IVF.  Hypomagnesemia: Mag low normal at 1.7. Replaced with Magnesium 2 g IV to help improve hypokalemia.  AKI: Resolved, likely pre-renal due to decreased po intake. Creatinine on admission was 0.96 which improved to 0.68 after IVF. Baseline creatinine appears to be 0.6.   HTN: BPs had been well controlled while holding home Lisinopril-HCTZ 20-25 mg. Given hyponatremia and hypokalemia likely secondary to decreased po intake  and HCTZ, we discontinued HCTZ. We continued Lisinopril 20 mg daily on discharge. This may need to be increased at follow up.  S/p Right Hip ORIF with Plate: Patient has chronic right hip pain from "loose screws." She has scheduled surgery for removal of right hip hardware with total right hip arthroplasty on 08/04/15. Her pain has been uncontrolled at home on Tramadol 50 mg Q6H prn pain. Patient was in visible distress due to her pain. Prescribed oxycodone 5-10 mg Q6H prn pain on discharge.  Anxiety: Stable. Patient is on Xanax 1 mg QHS prn at home. Continued.   Discharge Vitals:   BP 113/52 mmHg  Pulse 52  Temp(Src) 97.8 F (36.6 C) (Oral)  Resp 14  Ht 5' 2.5" (1.588 m)  Wt 213 lb 6.4 oz (96.798 kg)  BMI 38.39 kg/m2  SpO2 97%  Discharge Labs:  Results for orders placed or performed during the hospital encounter of 07/31/15 (from the past 24 hour(s))  CBC with Differential     Status: Abnormal   Collection  Time: 07/31/15  5:05 PM  Result Value Ref Range   WBC 9.8 4.0 - 10.5 K/uL   RBC 3.81 (L) 3.87 - 5.11 MIL/uL   Hemoglobin 11.1 (L) 12.0 - 15.0 g/dL   HCT 31.8 (L) 36.0 - 46.0 %   MCV 83.5 78.0 - 100.0 fL   MCH 29.1 26.0 - 34.0 pg   MCHC 34.9 30.0 - 36.0 g/dL   RDW 13.1 11.5 - 15.5 %   Platelets 439 (H) 150 - 400 K/uL   Neutrophils Relative % 69 %   Neutro Abs 6.7 1.7 - 7.7 K/uL   Lymphocytes Relative 23 %   Lymphs Abs 2.2 0.7 - 4.0 K/uL   Monocytes Relative 6 %   Monocytes Absolute 0.6 0.1 - 1.0 K/uL   Eosinophils Relative 2 %   Eosinophils Absolute 0.2 0.0 - 0.7 K/uL   Basophils Relative 0 %   Basophils Absolute 0.0 0.0 - 0.1 K/uL  Comprehensive metabolic panel     Status: Abnormal   Collection Time: 07/31/15  5:05 PM  Result Value Ref Range   Sodium 127 (L) 135 - 145 mmol/L   Potassium 2.7 (LL) 3.5 - 5.1 mmol/L   Chloride 83 (L) 101 - 111 mmol/L   CO2 25 22 - 32 mmol/L   Glucose, Bld 109 (H) 65 - 99 mg/dL   BUN 17 6 - 20 mg/dL   Creatinine, Ser 0.96 0.44 - 1.00 mg/dL   Calcium 9.8 8.9 - 10.3 mg/dL   Total Protein 7.0 6.5 - 8.1 g/dL   Albumin 3.5 3.5 - 5.0 g/dL   AST 24 15 - 41 U/L   ALT 17 14 - 54 U/L   Alkaline Phosphatase 106 38 - 126 U/L   Total Bilirubin 0.8 0.3 - 1.2 mg/dL   GFR calc non Af Amer 59 (L) >60 mL/min   GFR calc Af Amer >60 >60 mL/min   Anion gap 19 (H) 5 - 15  Magnesium     Status: None   Collection Time: 07/31/15  5:05 PM  Result Value Ref Range   Magnesium 1.7 1.7 - 2.4 mg/dL  Urinalysis, Routine w reflex microscopic     Status: None   Collection Time: 07/31/15  9:15 PM  Result Value Ref Range   Color, Urine YELLOW YELLOW   APPearance CLEAR CLEAR   Specific Gravity, Urine 1.008 1.005 - 1.030   pH 5.5 5.0 - 8.0   Glucose, UA NEGATIVE  NEGATIVE mg/dL   Hgb urine dipstick NEGATIVE NEGATIVE   Bilirubin Urine NEGATIVE NEGATIVE   Ketones, ur NEGATIVE NEGATIVE mg/dL   Protein, ur NEGATIVE NEGATIVE mg/dL   Nitrite NEGATIVE NEGATIVE   Leukocytes,  UA NEGATIVE NEGATIVE  Osmolality, urine     Status: Abnormal   Collection Time: 07/31/15 11:56 PM  Result Value Ref Range   Osmolality, Ur 188 (L) 300 - 900 mOsm/kg  Sodium, urine, random     Status: None   Collection Time: 07/31/15 11:56 PM  Result Value Ref Range   Sodium, Ur 16 mmol/L  Osmolality     Status: Abnormal   Collection Time: 08/01/15  1:53 AM  Result Value Ref Range   Osmolality 273 (L) 275 - 295 mOsm/kg  Basic metabolic panel     Status: Abnormal   Collection Time: 08/01/15  1:53 AM  Result Value Ref Range   Sodium 129 (L) 135 - 145 mmol/L   Potassium 2.8 (L) 3.5 - 5.1 mmol/L   Chloride 93 (L) 101 - 111 mmol/L   CO2 23 22 - 32 mmol/L   Glucose, Bld 148 (H) 65 - 99 mg/dL   BUN 13 6 - 20 mg/dL   Creatinine, Ser 0.87 0.44 - 1.00 mg/dL   Calcium 9.0 8.9 - 10.3 mg/dL   GFR calc non Af Amer >60 >60 mL/min   GFR calc Af Amer >60 >60 mL/min   Anion gap 13 5 - 15  Basic metabolic panel     Status: Abnormal   Collection Time: 08/01/15  6:14 AM  Result Value Ref Range   Sodium 133 (L) 135 - 145 mmol/L   Potassium 3.2 (L) 3.5 - 5.1 mmol/L   Chloride 98 (L) 101 - 111 mmol/L   CO2 25 22 - 32 mmol/L   Glucose, Bld 101 (H) 65 - 99 mg/dL   BUN 12 6 - 20 mg/dL   Creatinine, Ser 0.68 0.44 - 1.00 mg/dL   Calcium 9.0 8.9 - 10.3 mg/dL   GFR calc non Af Amer >60 >60 mL/min   GFR calc Af Amer >60 >60 mL/min   Anion gap 10 5 - 15  Basic metabolic panel     Status: Abnormal   Collection Time: 08/01/15 12:26 PM  Result Value Ref Range   Sodium 134 (L) 135 - 145 mmol/L   Potassium 4.1 3.5 - 5.1 mmol/L   Chloride 98 (L) 101 - 111 mmol/L   CO2 26 22 - 32 mmol/L   Glucose, Bld 122 (H) 65 - 99 mg/dL   BUN 8 6 - 20 mg/dL   Creatinine, Ser 0.54 0.44 - 1.00 mg/dL   Calcium 9.1 8.9 - 10.3 mg/dL   GFR calc non Af Amer >60 >60 mL/min   GFR calc Af Amer >60 >60 mL/min   Anion gap 10 5 - 15    Signed: Osa Craver, DO PGY-2 Internal Medicine Resident Pager # [phone] 08/01/2015  2:24 PM

## 2015-08-01 NOTE — Progress Notes (Signed)
Patient discharge teaching given, including activity, diet, follow-up appoints, and medications. Patient verbalized understanding of all discharge instructions. IV access was d/c'd. Vitals are stable. Skin is intact except as charted in most recent assessments. Pt to be escorted out by NT, to be driven home by family. 

## 2015-08-01 NOTE — Discharge Instructions (Signed)
STOP TAKING YOUR COMBINED HYDROCHLOROTHIAZIDE-LISINOPRIL.  I HAVE SENT IN A NEW PRESCRIPTION FOR LISINOPRIL 20 MG DAILY ONLY.  CONTINUE ALL OTHER MEDICATIONS THE SAME.  MAKE SURE YOU MAINTAIN ADEQUATE FOOD AND WATER INTAKE.   TAKE PERCOCET 5-325 1 OR 2 TABLETS EVERY 6 HOURS FOR PAIN. DO NOT TAKE THIS IN COMBINATION WITH YOUR TRAMADOL.

## 2015-08-01 NOTE — Progress Notes (Signed)
Paged on-call MD for Internal Medicine Teaching Service about pt stating that the morphine pain medicine that she received earlier was not effective in relieving her pain.  MD returned page after looking through the pt's chart and ordered RN to administer second PRN dose of Morphine immediately. Will carry out orders at this time.
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
## 2015-08-01 NOTE — Progress Notes (Signed)
Subjective:  Patient was seen and examined this morning. She states she is going home today as she is in severe pain laying in the hospital bed and needs to move around. She states she went to her anesthesiologist office for pre-op clearance when they sent her to the ED for her lab abnormalities. She denies any symptoms other than pain in her right foot and decreased appetite (which has improved this morning). She denies any chest pain, shortness of breath, nausea, vomiting or diarrhea.   Objective: Vital signs in last 24 hours: Filed Vitals:   08/01/15 0030 08/01/15 0136 08/01/15 0500 08/01/15 0625  BP: 113/61 112/45  113/52  Pulse: 76 77  52  Temp:  98.4 F (36.9 C) 98.4 F (36.9 C) 97.8 F (36.6 C)  TempSrc:  Oral  Oral  Resp: 18 14  14   Height:  5' 2.5" (1.588 m)    Weight:  213 lb 6.4 oz (96.798 kg)    SpO2: 98% 97%  97%   Weight change:   Intake/Output Summary (Last 24 hours) at 08/01/15 1055 Last data filed at 08/01/15 0804  Gross per 24 hour  Intake 631.25 ml  Output   1550 ml  Net -918.75 ml   General: Vital signs reviewed.  Patient is well-developed and well-nourished, in no acute distress and cooperative with exam.  Cardiovascular: RRR, S1 normal, S2 normal, no murmurs, gallops, or rubs. Pulmonary/Chest: Clear to auscultation bilaterally, no wheezes, rales, or rhonchi. Abdominal: Soft, non-tender, non-distended, BS + Extremities: Right hip pain. No lower extremity edema bilaterally.  Lab Results: Basic Metabolic Panel:  Recent Labs Lab 07/31/15 1705 08/01/15 0153 08/01/15 0614  NA 127* 129* 133*  K 2.7* 2.8* 3.2*  CL 83* 93* 98*  CO2 25 23 25   GLUCOSE 109* 148* 101*  BUN 17 13 12   CREATININE 0.96 0.87 0.68  CALCIUM 9.8 9.0 9.0  MG 1.7  --   --    Liver Function Tests:  Recent Labs Lab 07/31/15 1705  AST 24  ALT 17  ALKPHOS 106  BILITOT 0.8  PROT 7.0  ALBUMIN 3.5   CBC:  Recent Labs Lab 07/31/15 0923 07/31/15 1705  WBC 9.1 9.8    NEUTROABS  --  6.7  HGB 10.6* 11.1*  HCT 32.2* 31.8*  MCV 84.5 83.5  PLT 420* 439*   Urinalysis:  Recent Labs Lab 07/31/15 2115  COLORURINE YELLOW  LABSPEC 1.008  PHURINE 5.5  GLUCOSEU NEGATIVE  HGBUR NEGATIVE  BILIRUBINUR NEGATIVE  KETONESUR NEGATIVE  PROTEINUR NEGATIVE  NITRITE NEGATIVE  LEUKOCYTESUR NEGATIVE   Micro Results: Recent Results (from the past 240 hour(s))  Surgical pcr screen     Status: None   Collection Time: 07/31/15  9:22 AM  Result Value Ref Range Status   MRSA, PCR NEGATIVE NEGATIVE Final   Staphylococcus aureus NEGATIVE NEGATIVE Final    Comment:        The Xpert SA Assay (FDA approved for NASAL specimens in patients over 43 years of age), is one component of a comprehensive surveillance program.  Test performance has been validated by [facility] for patients greater than or equal to 22 year old. It is not intended to diagnose infection nor to guide or monitor treatment.    Medications:  I have reviewed the patient's current medications. Prior to Admission:  Prescriptions prior to admission  Medication Sig Dispense Refill Last Dose  . ALPRAZolam (XANAX) 1 MG tablet Take 1 tablet (1 mg total) by mouth at bedtime.  90 tablet 1 07/30/2015 at Unknown time  . aspirin EC 81 MG tablet Take 81 mg by mouth daily.    07/31/2015 at Unknown time  . docusate sodium 100 MG CAPS Take 100 mg by mouth 2 (two) times daily. (Patient taking differently: Take 100 mg by mouth every other day. ) 10 capsule 0 Past Week at Unknown time  . lisinopril-hydrochlorothiazide (PRINZIDE,ZESTORETIC) 20-12.5 MG tablet Take 1 tablet by mouth every morning. 90 tablet 2 07/31/2015 at Unknown time  . omeprazole (PRILOSEC) 20 MG capsule TAKE ONE CAPSULE BY MOUTH ONCE DAILY (Patient taking differently: TAKE ONE CAPSULE BY MOUTH ONCE DAILY AS NEEDED FOR HEARTBURN) 90 capsule 1 Past Week at Unknown time  . traMADol (ULTRAM) 50 MG tablet Take 50 mg by mouth every 6 (six) hours as needed  for moderate pain.   07/31/2015 at Unknown time  . cyclobenzaprine (FLEXERIL) 5 MG tablet Take 1 tablet (5 mg total) by mouth 3 (three) times daily as needed for muscle spasms. (Patient not taking: Reported on 07/14/2015) 30 tablet 0 Not Taking at Unknown time  . ibuprofen (ADVIL,MOTRIN) 600 MG tablet Take one by mouth every 6-8 hours as needed for pain (Patient not taking: Reported on 07/28/2015) 90 tablet 1 07/14/2015 at Unknown time  . oxyCODONE-acetaminophen (PERCOCET/ROXICET) 5-325 MG tablet Take 2 tablets by mouth every 4 (four) hours as needed for severe pain. 10 tablet 0    Scheduled Meds: . aspirin EC  81 mg Oral Daily  . enoxaparin (LOVENOX) injection  40 mg Subcutaneous Q24H  . lisinopril  20 mg Oral Daily  . potassium chloride  40 mEq Oral Once   Continuous Infusions:   PRN Meds:.ALPRAZolam, docusate sodium, morphine injection, ondansetron **OR** ondansetron (ZOFRAN) IV, oxyCODONE-acetaminophen Assessment/Plan: Principal Problem:   Hypokalemia Active Problems:   ANXIETY DEPRESSION   Essential hypertension   Hyponatremia   AKI (acute kidney injury) ([place])   Hypomagnesemia  Patient is a 70 yo F with a PMHx of HTN, GERD, osteopenia, degenerative arthritis presenting to the hospital for further evaluation of abnormal labs.   Hypokalemia: Potassium 2.7 on admission, without signs of EKG changes. Likely secondary to continued HCTZ use in the setting of decreased po intake and mildly decreased magnesium level. Potassium improved to 3.2 this morning after 100 mEq of potassium. Patient then received another 60 mEq. -D/C HCTZ -Kdur 40 mEq Q4H -Repeat BMET at 1200 -Likely d/c home later this afternoon  Hypochloremia: Cl improved from 85 to 98 this morning after IVF and holding HCTZ.  -D/C IVF -D/C HCTZ -Repeat BMET at 1200  Acute Hypovolemic Hyponatremia: Asymptomatic. Urine Osm 273, Urine Sodium low at 16, and Urine Osmolality low at 188, consistent with hypovolemia. Sodium improved  from 126 to 133 this morning after IVF. D/C IVF as we do not want patient to improve much more than this within 24 hours.  -D/C IVF -D/C HCTZ -Repeat BMET at 1200    Hypomagnesemia: Mag low normal at 1.7. Replaced with Magnesium 2 g IV to help improve hypokalemia. -Repleted  AKI: Resolved, likely pre-renal due to decreased po intake. Creatinine on admission was 0.96 which improved to 0.68 after IVF. Baseline creatinine appears to be 0.6.  -Resolved -D/C HCTZ  HTN: BPs have been well controlled while holding home Lisinopril-HCTZ 20-25 mg. Given hyponatremia and hypokalemia likely secondary to decreased po intake and HCTZ, will continue to hold HCTZ. -D/C HCTZ  -Continue Lisinopril 20 mg daily  S/p Right Hip ORIF with Plate: Patient has chronic right  hip pain from "loose screws." She has scheduled surgery for removal of right hip hardware with total right hip arthroplasty on 08/04/15. Her pain has been uncontrolled at home on Tramadol 50 mg Q6H prn pain. Patient is in visible distress due to her pain. -Oxycodone 5-10 mg Q4H prn pain -Morphine 2 mg IV Q4H prn severe hip pain   Anxiety: Stable. Patient is on Xanax 1 mg QHS prn at home. -Continue Xanax 1 mg QHS prn   DVT/ PE ppx: Lovenox  Dispo: Disposition is deferred at this time, awaiting improvement of current medical problems.  Anticipated discharge in 0 day(s).   The patient does have a current PCP (Ejiroghene Arlyce Dice, MD) and does need an [facility] hospital follow-up appointment after discharge.  The patient does not have transportation limitations that hinder transportation to clinic appointments.  Osa Craver, DO PGY-2 Internal Medicine Resident Pager # [phone] 08/01/2015 10:55 AM
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
## 2015-08-01 NOTE — Progress Notes (Signed)
Pt sleeping in bed peacefully at this time. Pain medication not given at this time. Will continue to monitor.

## 2015-08-02 LAB — URINE CULTURE: CULTURE: NO GROWTH

## 2015-08-03 MED ORDER — CLINDAMYCIN PHOSPHATE 900 MG/50ML IV SOLN
900.0000 mg | INTRAVENOUS | Status: AC
  Administered 2015-08-04: 900 mg via INTRAVENOUS
  Filled 2015-08-03: qty 50

## 2015-08-03 MED ORDER — TRANEXAMIC ACID 1000 MG/10ML IV SOLN
1000.0000 mg | INTRAVENOUS | Status: AC
  Administered 2015-08-04: 1000 mg via INTRAVENOUS
  Filled 2015-08-03: qty 10

## 2015-08-04 ENCOUNTER — Inpatient Hospital Stay (HOSPITAL_COMMUNITY): Payer: Commercial Managed Care - HMO

## 2015-08-04 ENCOUNTER — Inpatient Hospital Stay (HOSPITAL_COMMUNITY)
Admission: AD | Admit: 2015-08-04 | Discharge: 2015-08-07 | DRG: 466 | Disposition: A | Discharge status: Skilled Nursing Facility | Payer: Commercial Managed Care - HMO | Source: Ambulatory Visit | Attending: Orthopaedic Surgery | Admitting: Orthopaedic Surgery

## 2015-08-04 ENCOUNTER — Encounter (HOSPITAL_COMMUNITY)
Admission: AD | Disposition: A | Discharge status: Skilled Nursing Facility | Payer: Self-pay | Source: Ambulatory Visit | Attending: Orthopaedic Surgery

## 2015-08-04 ENCOUNTER — Inpatient Hospital Stay (HOSPITAL_COMMUNITY): Payer: Commercial Managed Care - HMO | Admitting: Emergency Medicine

## 2015-08-04 ENCOUNTER — Encounter (HOSPITAL_COMMUNITY): Payer: Self-pay | Admitting: Certified Registered"

## 2015-08-04 ENCOUNTER — Inpatient Hospital Stay (HOSPITAL_COMMUNITY): Payer: Commercial Managed Care - HMO | Admitting: Anesthesiology

## 2015-08-04 DIAGNOSIS — Z741 Need for assistance with personal care: Secondary | ICD-10-CM | POA: Diagnosis not present

## 2015-08-04 DIAGNOSIS — Z9841 Cataract extraction status, right eye: Secondary | ICD-10-CM | POA: Diagnosis not present

## 2015-08-04 DIAGNOSIS — M6281 Muscle weakness (generalized): Secondary | ICD-10-CM | POA: Diagnosis not present

## 2015-08-04 DIAGNOSIS — F329 Major depressive disorder, single episode, unspecified: Secondary | ICD-10-CM | POA: Diagnosis present

## 2015-08-04 DIAGNOSIS — K219 Gastro-esophageal reflux disease without esophagitis: Secondary | ICD-10-CM | POA: Diagnosis present

## 2015-08-04 DIAGNOSIS — Z419 Encounter for procedure for purposes other than remedying health state, unspecified: Secondary | ICD-10-CM

## 2015-08-04 DIAGNOSIS — M25559 Pain in unspecified hip: Secondary | ICD-10-CM | POA: Diagnosis not present

## 2015-08-04 DIAGNOSIS — I10 Essential (primary) hypertension: Secondary | ICD-10-CM | POA: Diagnosis not present

## 2015-08-04 DIAGNOSIS — Z96641 Presence of right artificial hip joint: Secondary | ICD-10-CM | POA: Diagnosis not present

## 2015-08-04 DIAGNOSIS — M25551 Pain in right hip: Secondary | ICD-10-CM | POA: Diagnosis present

## 2015-08-04 DIAGNOSIS — Z96653 Presence of artificial knee joint, bilateral: Secondary | ICD-10-CM | POA: Diagnosis present

## 2015-08-04 DIAGNOSIS — S72009A Fracture of unspecified part of neck of unspecified femur, initial encounter for closed fracture: Secondary | ICD-10-CM | POA: Diagnosis not present

## 2015-08-04 DIAGNOSIS — M879 Osteonecrosis, unspecified: Secondary | ICD-10-CM | POA: Diagnosis not present

## 2015-08-04 DIAGNOSIS — Y792 Prosthetic and other implants, materials and accessory orthopedic devices associated with adverse incidents: Secondary | ICD-10-CM | POA: Diagnosis present

## 2015-08-04 DIAGNOSIS — T8484XA Pain due to internal orthopedic prosthetic devices, implants and grafts, initial encounter: Secondary | ICD-10-CM

## 2015-08-04 DIAGNOSIS — S72141S Displaced intertrochanteric fracture of right femur, sequela: Secondary | ICD-10-CM | POA: Diagnosis not present

## 2015-08-04 DIAGNOSIS — T84020A Dislocation of internal right hip prosthesis, initial encounter: Principal | ICD-10-CM | POA: Diagnosis present

## 2015-08-04 DIAGNOSIS — D62 Acute posthemorrhagic anemia: Secondary | ICD-10-CM | POA: Diagnosis not present

## 2015-08-04 DIAGNOSIS — W19XXXA Unspecified fall, initial encounter: Secondary | ICD-10-CM | POA: Diagnosis present

## 2015-08-04 DIAGNOSIS — M1711 Unilateral primary osteoarthritis, right knee: Secondary | ICD-10-CM | POA: Diagnosis not present

## 2015-08-04 DIAGNOSIS — S72001A Fracture of unspecified part of neck of right femur, initial encounter for closed fracture: Secondary | ICD-10-CM | POA: Diagnosis not present

## 2015-08-04 DIAGNOSIS — Z471 Aftercare following joint replacement surgery: Secondary | ICD-10-CM | POA: Diagnosis not present

## 2015-08-04 DIAGNOSIS — T84498A Other mechanical complication of other internal orthopedic devices, implants and grafts, initial encounter: Secondary | ICD-10-CM | POA: Diagnosis not present

## 2015-08-04 DIAGNOSIS — R262 Difficulty in walking, not elsewhere classified: Secondary | ICD-10-CM | POA: Diagnosis not present

## 2015-08-04 HISTORY — PX: TOTAL HIP ARTHROPLASTY: SHX124

## 2015-08-04 HISTORY — PX: HARDWARE REMOVAL: SHX979

## 2015-08-04 LAB — PREPARE RBC (CROSSMATCH)

## 2015-08-04 SURGERY — ARTHROPLASTY, HIP, TOTAL, ANTERIOR APPROACH
Anesthesia: General | Site: Hip | Laterality: Right

## 2015-08-04 MED ORDER — EPHEDRINE SULFATE 50 MG/ML IJ SOLN
INTRAMUSCULAR | Status: AC
  Filled 2015-08-04: qty 1

## 2015-08-04 MED ORDER — MENTHOL 3 MG MT LOZG: 1.0000 | LOZENGE | OROMUCOSAL | Status: DC | PRN

## 2015-08-04 MED ORDER — METHOCARBAMOL 1000 MG/10ML IJ SOLN
500.0000 mg | Freq: Four times a day (QID) | INTRAMUSCULAR | Status: DC | PRN
  Filled 2015-08-04: qty 5

## 2015-08-04 MED ORDER — PANTOPRAZOLE SODIUM 40 MG PO TBEC
40.0000 mg | DELAYED_RELEASE_TABLET | Freq: Every day | ORAL | Status: DC
  Administered 2015-08-05 – 2015-08-07 (×3): 40 mg via ORAL
  Filled 2015-08-04 (×3): qty 1

## 2015-08-04 MED ORDER — ONDANSETRON HCL 4 MG/2ML IJ SOLN
INTRAMUSCULAR | Status: AC
  Filled 2015-08-04: qty 2

## 2015-08-04 MED ORDER — FENTANYL CITRATE (PF) 250 MCG/5ML IJ SOLN
INTRAMUSCULAR | Status: AC
  Filled 2015-08-04: qty 5

## 2015-08-04 MED ORDER — METOCLOPRAMIDE HCL 5 MG PO TABS: 5.0000 mg | ORAL_TABLET | Freq: Three times a day (TID) | ORAL | Status: DC | PRN

## 2015-08-04 MED ORDER — ROCURONIUM BROMIDE 50 MG/5ML IV SOLN
INTRAVENOUS | Status: AC
  Filled 2015-08-04: qty 1

## 2015-08-04 MED ORDER — LIDOCAINE HCL (CARDIAC) 20 MG/ML IV SOLN
INTRAVENOUS | Status: DC | PRN
  Administered 2015-08-04: 100 mg via INTRAVENOUS

## 2015-08-04 MED ORDER — ROCURONIUM BROMIDE 100 MG/10ML IV SOLN
INTRAVENOUS | Status: DC | PRN
  Administered 2015-08-04: 10 mg via INTRAVENOUS
  Administered 2015-08-04: 50 mg via INTRAVENOUS
  Administered 2015-08-04 (×3): 10 mg via INTRAVENOUS

## 2015-08-04 MED ORDER — PHENYLEPHRINE HCL 10 MG/ML IJ SOLN
INTRAMUSCULAR | Status: DC | PRN
  Administered 2015-08-04 (×4): 80 ug via INTRAVENOUS

## 2015-08-04 MED ORDER — CHLORHEXIDINE GLUCONATE 4 % EX LIQD: 60.0000 mL | Freq: Once | CUTANEOUS | Status: DC

## 2015-08-04 MED ORDER — ALPRAZOLAM 0.5 MG PO TABS
1.0000 mg | ORAL_TABLET | Freq: Every day | ORAL | Status: DC
  Administered 2015-08-04 – 2015-08-07 (×3): 1 mg via ORAL
  Filled 2015-08-04 (×3): qty 2

## 2015-08-04 MED ORDER — METOCLOPRAMIDE HCL 5 MG/ML IJ SOLN: 5.0000 mg | Freq: Three times a day (TID) | INTRAMUSCULAR | Status: DC | PRN

## 2015-08-04 MED ORDER — FENTANYL CITRATE (PF) 100 MCG/2ML IJ SOLN
50.0000 ug | Freq: Once | INTRAMUSCULAR | Status: AC
  Administered 2015-08-04: 50 ug via INTRAVENOUS

## 2015-08-04 MED ORDER — FENTANYL CITRATE (PF) 100 MCG/2ML IJ SOLN
25.0000 ug | INTRAMUSCULAR | Status: DC | PRN
  Administered 2015-08-04 (×2): 50 ug via INTRAVENOUS

## 2015-08-04 MED ORDER — ALUM & MAG HYDROXIDE-SIMETH 200-200-20 MG/5ML PO SUSP: 30.0000 mL | ORAL | Status: DC | PRN

## 2015-08-04 MED ORDER — DEXAMETHASONE SODIUM PHOSPHATE 4 MG/ML IJ SOLN
INTRAMUSCULAR | Status: DC | PRN
  Administered 2015-08-04: 4 mg via INTRAVENOUS

## 2015-08-04 MED ORDER — GLYCOPYRROLATE 0.2 MG/ML IJ SOLN
INTRAMUSCULAR | Status: AC
  Filled 2015-08-04: qty 2

## 2015-08-04 MED ORDER — PHENYLEPHRINE HCL 10 MG/ML IJ SOLN
10.0000 mg | INTRAVENOUS | Status: DC | PRN
  Administered 2015-08-04: 80 ug/min via INTRAVENOUS

## 2015-08-04 MED ORDER — OXYCODONE HCL 5 MG PO TABS
5.0000 mg | ORAL_TABLET | ORAL | Status: DC | PRN
  Administered 2015-08-04 – 2015-08-07 (×10): 10 mg via ORAL
  Filled 2015-08-04 (×11): qty 2

## 2015-08-04 MED ORDER — LIDOCAINE HCL (CARDIAC) 20 MG/ML IV SOLN
INTRAVENOUS | Status: AC
  Filled 2015-08-04: qty 5

## 2015-08-04 MED ORDER — ASPIRIN EC 325 MG PO TBEC
325.0000 mg | DELAYED_RELEASE_TABLET | Freq: Two times a day (BID) | ORAL | Status: DC
  Administered 2015-08-05 – 2015-08-07 (×6): 325 mg via ORAL
  Filled 2015-08-04 (×6): qty 1

## 2015-08-04 MED ORDER — FENTANYL CITRATE (PF) 100 MCG/2ML IJ SOLN
INTRAMUSCULAR | Status: AC
  Filled 2015-08-04: qty 2

## 2015-08-04 MED ORDER — METHOCARBAMOL 500 MG PO TABS
500.0000 mg | ORAL_TABLET | Freq: Four times a day (QID) | ORAL | Status: DC | PRN
  Administered 2015-08-05 – 2015-08-07 (×4): 500 mg via ORAL
  Filled 2015-08-04 (×4): qty 1

## 2015-08-04 MED ORDER — ACETAMINOPHEN 325 MG PO TABS
650.0000 mg | ORAL_TABLET | Freq: Four times a day (QID) | ORAL | Status: DC | PRN
  Administered 2015-08-06 – 2015-08-07 (×2): 650 mg via ORAL
  Filled 2015-08-04 (×2): qty 2

## 2015-08-04 MED ORDER — ONDANSETRON HCL 4 MG PO TABS: 4.0000 mg | ORAL_TABLET | Freq: Four times a day (QID) | ORAL | Status: DC | PRN

## 2015-08-04 MED ORDER — FENTANYL CITRATE (PF) 100 MCG/2ML IJ SOLN
INTRAMUSCULAR | Status: AC
  Administered 2015-08-04: 100 ug via INTRAVENOUS
  Administered 2015-08-04: 50 ug via INTRAVENOUS
  Filled 2015-08-04: qty 2

## 2015-08-04 MED ORDER — ALBUMIN HUMAN 5 % IV SOLN
INTRAVENOUS | Status: DC | PRN
  Administered 2015-08-04 (×2): via INTRAVENOUS

## 2015-08-04 MED ORDER — ONDANSETRON HCL 4 MG/2ML IJ SOLN
INTRAMUSCULAR | Status: DC | PRN
  Administered 2015-08-04: 4 mg via INTRAVENOUS

## 2015-08-04 MED ORDER — DIPHENHYDRAMINE HCL 50 MG/ML IJ SOLN
INTRAMUSCULAR | Status: AC
  Filled 2015-08-04: qty 1

## 2015-08-04 MED ORDER — DOCUSATE SODIUM 100 MG PO CAPS
100.0000 mg | ORAL_CAPSULE | Freq: Two times a day (BID) | ORAL | Status: DC
  Administered 2015-08-04 – 2015-08-07 (×6): 100 mg via ORAL
  Filled 2015-08-04 (×6): qty 1

## 2015-08-04 MED ORDER — SODIUM CHLORIDE 0.9 % IV SOLN
Freq: Once | INTRAVENOUS | Status: AC
  Administered 2015-08-04: 18:00:00 via INTRAVENOUS

## 2015-08-04 MED ORDER — DIPHENHYDRAMINE HCL 12.5 MG/5ML PO ELIX: 12.5000 mg | ORAL_SOLUTION | ORAL | Status: DC | PRN

## 2015-08-04 MED ORDER — PHENOL 1.4 % MT LIQD: 1.0000 | OROMUCOSAL | Status: DC | PRN

## 2015-08-04 MED ORDER — SODIUM CHLORIDE 0.9 % IR SOLN
Status: DC | PRN
  Administered 2015-08-04: 1000 mL

## 2015-08-04 MED ORDER — DIPHENHYDRAMINE HCL 50 MG/ML IJ SOLN
INTRAMUSCULAR | Status: DC | PRN
  Administered 2015-08-04: 10 mg via INTRAVENOUS

## 2015-08-04 MED ORDER — DEXAMETHASONE SODIUM PHOSPHATE 4 MG/ML IJ SOLN
INTRAMUSCULAR | Status: AC
  Filled 2015-08-04: qty 1

## 2015-08-04 MED ORDER — ONDANSETRON HCL 4 MG/2ML IJ SOLN: 4.0000 mg | Freq: Four times a day (QID) | INTRAMUSCULAR | Status: DC | PRN

## 2015-08-04 MED ORDER — SUGAMMADEX SODIUM 200 MG/2ML IV SOLN
INTRAVENOUS | Status: AC
  Filled 2015-08-04: qty 2

## 2015-08-04 MED ORDER — HYDROMORPHONE HCL 1 MG/ML IJ SOLN: 1.0000 mg | INTRAMUSCULAR | Status: DC | PRN

## 2015-08-04 MED ORDER — ACETAMINOPHEN 650 MG RE SUPP: 650.0000 mg | Freq: Four times a day (QID) | RECTAL | Status: DC | PRN

## 2015-08-04 MED ORDER — ONDANSETRON HCL 4 MG/2ML IJ SOLN: 4.0000 mg | Freq: Once | INTRAMUSCULAR | Status: DC | PRN

## 2015-08-04 MED ORDER — SODIUM CHLORIDE 0.9 % IV SOLN
INTRAVENOUS | Status: DC
  Administered 2015-08-05: via INTRAVENOUS

## 2015-08-04 MED ORDER — MIDAZOLAM HCL 2 MG/2ML IJ SOLN
INTRAMUSCULAR | Status: AC
  Filled 2015-08-04: qty 2

## 2015-08-04 MED ORDER — PROPOFOL 10 MG/ML IV BOLUS
INTRAVENOUS | Status: AC
  Filled 2015-08-04: qty 20

## 2015-08-04 MED ORDER — MIDAZOLAM HCL 5 MG/5ML IJ SOLN
INTRAMUSCULAR | Status: DC | PRN
  Administered 2015-08-04: 2 mg via INTRAVENOUS

## 2015-08-04 MED ORDER — SUGAMMADEX SODIUM 200 MG/2ML IV SOLN
INTRAVENOUS | Status: DC | PRN
  Administered 2015-08-04: 200 mg via INTRAVENOUS

## 2015-08-04 MED ORDER — 0.9 % SODIUM CHLORIDE (POUR BTL) OPTIME
TOPICAL | Status: DC | PRN
  Administered 2015-08-04: 1000 mL

## 2015-08-04 MED ORDER — CLINDAMYCIN PHOSPHATE 600 MG/50ML IV SOLN
600.0000 mg | Freq: Four times a day (QID) | INTRAVENOUS | Status: AC
  Administered 2015-08-05 (×2): 600 mg via INTRAVENOUS
  Filled 2015-08-04 (×4): qty 50

## 2015-08-04 MED ORDER — PROPOFOL 10 MG/ML IV BOLUS
INTRAVENOUS | Status: DC | PRN
Start: 2015-08-04 — End: 2015-08-04
  Administered 2015-08-04: 100 mg via INTRAVENOUS

## 2015-08-04 MED ORDER — LACTATED RINGERS IV SOLN
INTRAVENOUS | Status: DC
Start: 2015-08-04 — End: 2015-08-04
  Administered 2015-08-04 (×3): via INTRAVENOUS

## 2015-08-04 MED ORDER — NEOSTIGMINE METHYLSULFATE 10 MG/10ML IV SOLN
INTRAVENOUS | Status: AC
  Filled 2015-08-04: qty 1

## 2015-08-04 SURGICAL SUPPLY — 88 items
APL SKNCLS STERI-STRIP NONHPOA (GAUZE/BANDAGES/DRESSINGS) ×1
BANDAGE ELASTIC 4 VELCRO ST LF (GAUZE/BANDAGES/DRESSINGS) IMPLANT
BANDAGE ELASTIC 6 VELCRO ST LF (GAUZE/BANDAGES/DRESSINGS) IMPLANT
BANDAGE ESMARK 6X9 LF (GAUZE/BANDAGES/DRESSINGS) IMPLANT
BENZOIN TINCTURE PRP APPL 2/3 (GAUZE/BANDAGES/DRESSINGS) ×3 IMPLANT
BLADE SAW SGTL 18X1.27X75 (BLADE) ×2 IMPLANT
BLADE SAW SGTL 18X1.27X75MM (BLADE) ×1
BLADE SURG ROTATE 9660 (MISCELLANEOUS) IMPLANT
BNDG CMPR 9X6 STRL LF SNTH (GAUZE/BANDAGES/DRESSINGS)
BNDG COHESIVE 4X5 TAN STRL (GAUZE/BANDAGES/DRESSINGS) IMPLANT
BNDG ESMARK 6X9 LF (GAUZE/BANDAGES/DRESSINGS)
BNDG GAUZE ELAST 4 BULKY (GAUZE/BANDAGES/DRESSINGS) ×3 IMPLANT
CELLS DAT CNTRL 66122 CELL SVR (MISCELLANEOUS) ×1 IMPLANT
CLOSURE WOUND 1/2 X4 (GAUZE/BANDAGES/DRESSINGS) ×2
COVER SURGICAL LIGHT HANDLE (MISCELLANEOUS) ×3 IMPLANT
CUFF TOURNIQUET SINGLE 34IN LL (TOURNIQUET CUFF) IMPLANT
CUFF TOURNIQUET SINGLE 44IN (TOURNIQUET CUFF) IMPLANT
CUP SECTOR GRIPTON 50MM (Cup) ×2 IMPLANT
DRAPE C-ARM 42X72 X-RAY (DRAPES) ×3 IMPLANT
DRAPE EXTREMITY T 121X128X90 (DRAPE) IMPLANT
DRAPE INCISE IOBAN 66X45 STRL (DRAPES) IMPLANT
DRAPE ORTHO SPLIT 77X108 STRL (DRAPES)
DRAPE PROXIMA HALF (DRAPES) IMPLANT
DRAPE STERI IOBAN 125X83 (DRAPES) ×3 IMPLANT
DRAPE SURG ORHT 6 SPLT 77X108 (DRAPES) IMPLANT
DRAPE U-SHAPE 47X51 STRL (DRAPES) ×9 IMPLANT
DRESSING AQUACEL AQ EXTRA 4X5 (GAUZE/BANDAGES/DRESSINGS) ×4 IMPLANT
DRSG AQUACEL AG ADV 3.5X10 (GAUZE/BANDAGES/DRESSINGS) ×3 IMPLANT
DRSG EMULSION OIL 3X3 NADH (GAUZE/BANDAGES/DRESSINGS) ×3 IMPLANT
DRSG MEPILEX BORDER 4X8 (GAUZE/BANDAGES/DRESSINGS) ×4 IMPLANT
DRSG PAD ABDOMINAL 8X10 ST (GAUZE/BANDAGES/DRESSINGS) ×3 IMPLANT
DURAPREP 26ML APPLICATOR (WOUND CARE) ×3 IMPLANT
ELECT BLADE 4.0 EZ CLEAN MEGAD (MISCELLANEOUS) ×3
ELECT BLADE 6.5 EXT (BLADE) IMPLANT
ELECT REM PT RETURN 9FT ADLT (ELECTROSURGICAL) ×3
ELECTRODE BLDE 4.0 EZ CLN MEGD (MISCELLANEOUS) ×1 IMPLANT
ELECTRODE REM PT RTRN 9FT ADLT (ELECTROSURGICAL) ×1 IMPLANT
FACESHIELD WRAPAROUND (MASK) ×6 IMPLANT
FACESHIELD WRAPAROUND OR TEAM (MASK) ×2 IMPLANT
GAUZE SPONGE 4X4 12PLY STRL (GAUZE/BANDAGES/DRESSINGS) ×3 IMPLANT
GAUZE XEROFORM 1X8 LF (GAUZE/BANDAGES/DRESSINGS) ×4 IMPLANT
GAUZE XEROFORM 5X9 LF (GAUZE/BANDAGES/DRESSINGS) ×3 IMPLANT
GLOVE BIO SURGEON STRL SZ8 (GLOVE) ×3 IMPLANT
GLOVE BIOGEL PI IND STRL 8 (GLOVE) ×2 IMPLANT
GLOVE BIOGEL PI INDICATOR 8 (GLOVE) ×4
GLOVE ECLIPSE 8.0 STRL XLNG CF (GLOVE) ×3 IMPLANT
GLOVE ORTHO TXT STRL SZ7.5 (GLOVE) ×6 IMPLANT
GOWN STRL REUS W/ TWL LRG LVL3 (GOWN DISPOSABLE) ×3 IMPLANT
GOWN STRL REUS W/ TWL XL LVL3 (GOWN DISPOSABLE) ×2 IMPLANT
GOWN STRL REUS W/TWL LRG LVL3 (GOWN DISPOSABLE) ×9
GOWN STRL REUS W/TWL XL LVL3 (GOWN DISPOSABLE) ×6
HANDPIECE INTERPULSE COAX TIP (DISPOSABLE) ×3
HEAD CERAMIC 36 PLUS 8.5 12 14 (Hips) ×2 IMPLANT
KIT BASIN OR (CUSTOM PROCEDURE TRAY) ×3 IMPLANT
KIT ROOM TURNOVER OR (KITS) ×3 IMPLANT
LINER NEUTRAL 52X36MM PLUS 4 (Liner) ×2 IMPLANT
MANIFOLD NEPTUNE II (INSTRUMENTS) ×3 IMPLANT
NS IRRIG 1000ML POUR BTL (IV SOLUTION) ×3 IMPLANT
PACK GENERAL/GYN (CUSTOM PROCEDURE TRAY) ×3 IMPLANT
PACK TOTAL JOINT (CUSTOM PROCEDURE TRAY) ×3 IMPLANT
PAD ARMBOARD 7.5X6 YLW CONV (MISCELLANEOUS) ×6 IMPLANT
PAD CAST 4YDX4 CTTN HI CHSV (CAST SUPPLIES) ×1 IMPLANT
PADDING CAST COTTON 4X4 STRL (CAST SUPPLIES) ×3
PIN SECTOR W/GRIP ACE CUP 52MM (Hips) ×2 IMPLANT
RETRACTOR WND ALEXIS 18 MED (MISCELLANEOUS) ×1 IMPLANT
RTRCTR WOUND ALEXIS 18CM MED (MISCELLANEOUS) ×3
SCREW 6.5MMX25MM (Screw) ×3 IMPLANT
SCREW PINN CAN 6.5X20 (Screw) ×3 IMPLANT
SET HNDPC FAN SPRY TIP SCT (DISPOSABLE) ×1 IMPLANT
SPONGE LAP 18X18 X RAY DECT (DISPOSABLE) ×4 IMPLANT
STAPLER VISISTAT 35W (STAPLE) ×3 IMPLANT
STEM CORAIL KA11 (Stem) ×2 IMPLANT
STOCKINETTE IMPERVIOUS 9X36 MD (GAUZE/BANDAGES/DRESSINGS) IMPLANT
STRIP CLOSURE SKIN 1/2X4 (GAUZE/BANDAGES/DRESSINGS) ×4 IMPLANT
SUT ETHIBOND NAB CT1 #1 30IN (SUTURE) ×1 IMPLANT
SUT ETHILON 4 0 FS 1 (SUTURE) IMPLANT
SUT MNCRL AB 4-0 PS2 18 (SUTURE) IMPLANT
SUT VIC AB 0 CT1 27 (SUTURE) ×12
SUT VIC AB 0 CT1 27XBRD ANBCTR (SUTURE) ×4 IMPLANT
SUT VIC AB 1 CT1 27 (SUTURE) ×6
SUT VIC AB 1 CT1 27XBRD ANBCTR (SUTURE) ×1 IMPLANT
SUT VIC AB 2-0 CT1 27 (SUTURE) ×12
SUT VIC AB 2-0 CT1 TAPERPNT 27 (SUTURE) ×1 IMPLANT
TOWEL OR 17X24 6PK STRL BLUE (TOWEL DISPOSABLE) ×3 IMPLANT
TOWEL OR 17X26 10 PK STRL BLUE (TOWEL DISPOSABLE) ×3 IMPLANT
TRAY FOLEY CATH 14FRSI W/METER (CATHETERS) ×2 IMPLANT
TRAY FOLEY CATH 16FRSI W/METER (SET/KITS/TRAYS/PACK) IMPLANT
WATER STERILE IRR 1000ML POUR (IV SOLUTION) ×2 IMPLANT

## 2015-08-04 NOTE — Brief Op Note (Signed)
08/04/2015  6:15 PM  PATIENT:  Lori Bradford  70 y.o. female  PRE-OPERATIVE DIAGNOSIS:  failed fixation right hip fracture  POST-OPERATIVE DIAGNOSIS:  failed fixation right hip fracture  PROCEDURE:  Procedure(s): REMOVE HARDWARE RIGHT HIP AND RIGHT TOTAL HIP ARTHROPLASTY ANTERIOR APPROACH (Right) HARDWARE REMOVAL RIGHT FEMUR (Right)  SURGEON:  Surgeon(s) and Role:    * Mcarthur Rossetti, MD - Primary  PHYSICIAN ASSISTANT: Benita Stabile, PA-C  ANESTHESIA:   general  EBL:  Total I/O In: 3670 [I.V.:2500; Blood:670; IV Piggyback:500] Out: 1500 [Urine:300; Blood:1200]  BLOOD ADMINISTERED:  2 units PRBCs  COUNTS:  YES  DICTATION: .Other Dictation: Dictation Number [id number]  PLAN OF CARE: Admit to inpatient   PATIENT DISPOSITION:  PACU - hemodynamically stable.   Delay start of Pharmacological VTE agent (>24hrs) due to surgical blood loss or risk of bleeding: no
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
## 2015-08-04 NOTE — Anesthesia Preprocedure Evaluation (Addendum)
Anesthesia Evaluation  Patient identified by MRN, date of birth, ID band Patient awake    Reviewed: Allergy & Precautions, NPO status , Patient's Chart, lab work & pertinent test results  History of Anesthesia Complications (+) PONV and history of anesthetic complications (N&V AFTER KNEE REPLACEMENT - AFTER PT WAS ALREADY IN HER ROOM AND HAD EATEN SUPPER)  Airway Mallampati: III  TM Distance: >3 FB Neck ROM: Full    Dental  (+) Teeth Intact, Dental Advisory Given   Pulmonary neg pulmonary ROS,    Pulmonary exam normal breath sounds clear to auscultation       Cardiovascular hypertension, Pt. on medications Normal cardiovascular exam+ dysrhythmias (Palpitations per patient, never seen by Cardiology)  Rhythm:Regular Rate:Normal     Neuro/Psych PSYCHIATRIC DISORDERS Depression Bilateral fingertip numbness    GI/Hepatic Neg liver ROS, GERD  Medicated and Controlled,  Endo/Other  negative endocrine ROSObesity   Renal/GU negative Renal ROS     Musculoskeletal  (+) Arthritis , Osteoarthritis,    Abdominal   Peds  Hematology  (+) Blood dyscrasia, anemia ,   Anesthesia Other Findings Day of surgery medications reviewed with the patient.  Reproductive/Obstetrics                           Anesthesia Physical Anesthesia Plan  ASA: III  Anesthesia Plan: General   Post-op Pain Management:    Induction: Intravenous  Airway Management Planned: Oral ETT  Additional Equipment:   Intra-op Plan:   Post-operative Plan: Extubation in OR  Informed Consent: I have reviewed the patients History and Physical, chart, labs and discussed the procedure including the risks, benefits and alternatives for the proposed anesthesia with the patient or authorized representative who has indicated his/her understanding and acceptance.   Dental advisory given  Plan Discussed with: CRNA  Anesthesia Plan Comments:  (Risks/benefits of general anesthesia discussed with patient including risk of damage to teeth, lips, gum, and tongue, nausea/vomiting, allergic reactions to medications, and the possibility of heart attack, stroke and death.  All patient questions answered.  Patient wishes to proceed.)        Anesthesia Quick Evaluation

## 2015-08-04 NOTE — Op Note (Signed)
NAMELATOSHIA, Lori Bradford               ACCOUNT NO.:  [id number]  MEDICAL RECORD NO.:  [id number]  LOCATION:  [facility]                         FACILITY:  [facility]  PHYSICIAN:  Lind Guest. Ninfa Linden, M.D.DATE OF BIRTH:  19-Feb-1946  DATE OF PROCEDURE:  08/04/2015 DATE OF DISCHARGE:                              OPERATIVE REPORT   PREOPERATIVE DIAGNOSIS:  Failure of hardware and fixation, right hip high intertroch/low basicervical femoral neck fracture with compression screw hip cut-out.  POSTOPERATIVE DIAGNOSIS:  Failure of hardware and fixation, right hip high intertroch/low basicervical femoral neck fracture with compression screw hip cut-out.  PROCEDURE: 1. Removal of hardware from the right hip including intramedullary     nail, distal interlock, and compression hip screw. 2. Right total hip arthroplasty through direct anterior approach.  SURGEON:  Lind Guest. Ninfa Linden, MD  ASSISTANT:  Erskine Emery, PA-C  ANESTHESIA:  General.  ANTIBIOTICS:  2 g IV Ancef.  BLOOD LOSS:  1200 mL.  FLUIDS GIVEN:  2 units of packed red blood cells.  COMPLICATIONS:  None.  INDICATIONS:  Pizer is a 70 year old female, well known to me.  I performed bilateral knee replacements on her in the past.  Unfortunately in December of this past year, which was just a little over 2 months ago, she sustained a mechanical fall suffering a right hip fracture. She was seen at [facility], another surgeon performed open reduction and internal fixation with a short intramedullary nail and hip screw.  Over the next several weeks, she attempted to weight bear, the pain got worse and when I saw her in the office for 2nd opinion, the hardware failed and screw had cut out and the fracture looked now reduced.  At this point, we recommended hardware removal and conversion to a total hip replacement given the failure fixation and the screw cut out through the head.  The risks of surgery were explained  to her in detail including the risk of likely significant blood loss, acute blood loss anemia, nerve and vessel injury, fracture, infection, dislocation, DVT.  She understands our goals are decreased pain, improved mobility, and overall improved quality of life.  PROCEDURE DESCRIPTION:  After informed consent was obtained, appropriate right hip was marked.  She was brought to the operating room, and general anesthesia obtained while she was on her stretcher.  A Foley catheter was placed.  Next, she was placed supine on the Hana fracture table with the perineal post in place and both legs in inline skeletal traction devices, but no traction applied.  Her right operative hip was prepped and draped with DuraPrep and sterile drapes.  Time-out was called and she was identified as correct patient, correct right hip. First, we abducted the nonoperative leg and adducted the operative leg. We opened up her previous incisions from her prior hip surgery and dissected down the tip of the greater trochanter.  We were able to release soft tissue, and identify the tip of the nail and we put our distraction device through the tip of the nail after releasing the set screw.  We then made incision little bit more distal down to the previous incision to remove the distal interlock from the  screw and then easily removed the compression hip screw.  We then backed the rod out without complications.  Of note, there was still significant blood loss from this dissection and removing the hardware.  Next, we proceeded through with a direct anterior hip replacement.  A separate incision was made just inferior and posterior to the anterior superior iliac spine and carried obliquely down the leg.  We dissected down the tensor fascia lata muscle and tensor fascia was divided longitudinally to proceed with a direct anterior approach to the hip.  We identified and cauterized the lateral femoral circumflex vessels and  then identified the hip capsule. 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  We had to make our femoral neck cut at the level of the calcar because of her fracture removed, the femoral head in pieces due to it showing obvious osteonecrosis.  We then exposed the acetabulum and cleaned the remnants of acetabular labrum and debris.  We reamed from a size 42 reamer in 2 mm increments up to a size 50 and then with the last reamer under direct visualization.  We then placed a 50 acetabular component and I tried to get some screws in place, but this was not seating well and this may have been a soft tissue if she had reaming issue, I had to remove that acetabular component.  I reamed one more mm up to 51 and then placed a 52 acetabular component.  This one actually fit more securely and we placed a single screw that did penetrate the inner table of the pelvis.  We then placed the real 36+ 4 polyethylene liner, given the medialization.  Attention was then turned to the femur with the leg externally rotated to 100 degrees and extended and abducted.  We were able to release lateral joint capsule.  We placed a Mueller retractor medially and a Hohmann retractor behind the greater trochanter.  We then began broaching from a size 8, broach up to a size 11.  With the size 11, feeling tight and proud from the calcar, we trialed a 36+ 5 hip ball because I thought this is what she needed lengthwise reducing the pelvis.  She was stable, but I felt like she needed release of longer hip ball.  We dislocated the hip and removed the trial components.  We then placed the real Corail femoral component size 11 and the real 36+ 8.5 ceramic hip ball reduced this in the acetabulum.  Again, we were pleased with stability and leg length.  We then irrigated all 3 wounds with normal saline solution.  We tried to close some of the joint capsule with interrupted #1  Ethibond suture followed by the tensor fascia with a running #1 Vicryl suture, 0 Vicryl in the deep tissue, 2-0 Vicryl subcutaneous tissue, and staples on the skin for the anterior incision.  The 2 other incisions were closed deeply with 0 Vicryl followed by 2-0 Vicryl and staples.  Xeroform and sterile dressings placed over all incisions.  She was taken off the Hana table, awakened, extubated and taken to the recovery room in stable condition.  All final counts were correct.  There were no complications noted.  Of note, Erskine Emery, PA-C assisted in all the major portions of the case.     Lind Guest. Ninfa Linden, M.D.     CYB/MEDQ  D:  08/04/2015  T:  08/04/2015  Job:  [id number]

## 2015-08-04 NOTE — Transfer of Care (Signed)
Immediate Anesthesia Transfer of Care Note  Patient: Lori Bradford  Procedure(s) Performed: Procedure(s): REMOVE HARDWARE RIGHT HIP AND RIGHT TOTAL HIP ARTHROPLASTY ANTERIOR APPROACH (Right) HARDWARE REMOVAL RIGHT FEMUR (Right)  Patient Location: PACU  Anesthesia Type:General  Level of Consciousness: awake, oriented and patient cooperative  Airway & Oxygen Therapy: Patient Spontanous Breathing and Patient connected to nasal cannula oxygen  Post-op Assessment: Report given to RN, Post -op Vital signs reviewed and stable and Patient moving all extremities  Post vital signs: Reviewed and stable  Last Vitals:  Filed Vitals:   08/04/15 1304 08/04/15 1804  BP: 127/48   Pulse: 84 76  Temp: 36.4 C 36.8 C  Resp: 16 20    Complications: No apparent anesthesia complications

## 2015-08-04 NOTE — Anesthesia Procedure Notes (Signed)
Procedure Name: Intubation Date/Time: 08/04/2015 3:06 PM Performed by: Melina Copa, Yvonnia Tango R Pre-anesthesia Checklist: Patient identified, Emergency Drugs available, Suction available, Patient being monitored and Timeout performed Patient Re-evaluated:Patient Re-evaluated prior to inductionOxygen Delivery Method: Circle system utilized Preoxygenation: Pre-oxygenation with 100% oxygen Intubation Type: IV induction Ventilation: Mask ventilation without difficulty Laryngoscope Size: Mac and 3 Grade View: Grade II Tube type: Oral Tube size: 7.5 mm Number of attempts: 1 Airway Equipment and Method: Stylet Placement Confirmation: ETT inserted through vocal cords under direct vision,  positive ETCO2 and breath sounds checked- equal and bilateral Secured at: 22 cm Tube secured with: Tape Dental Injury: Teeth and Oropharynx as per pre-operative assessment

## 2015-08-05 LAB — BASIC METABOLIC PANEL
Anion gap: 9 (ref 5–15)
CHLORIDE: 99 mmol/L — AB (ref 101–111)
CO2: 27 mmol/L (ref 22–32)
CREATININE: 0.62 mg/dL (ref 0.44–1.00)
Calcium: 8.3 mg/dL — ABNORMAL LOW (ref 8.9–10.3)
GFR calc Af Amer: >60 mL/min (ref >60)
GFR calc non Af Amer: >60 mL/min (ref >60)
Glucose, Bld: 135 mg/dL — ABNORMAL HIGH (ref 65–99)
Potassium: 3.9 mmol/L (ref 3.5–5.1)
SODIUM: 135 mmol/L (ref 135–145)

## 2015-08-05 LAB — CBC
HEMATOCRIT: 26.6 % — AB (ref 36.0–46.0)
HEMOGLOBIN: 8.9 g/dL — AB (ref 12.0–15.0)
MCH: 28.5 pg (ref 26.0–34.0)
MCHC: 33.5 g/dL (ref 30.0–36.0)
MCV: 85.3 fL (ref 78.0–100.0)
Platelets: 284 10*3/uL (ref 150–400)
RBC: 3.12 MIL/uL — ABNORMAL LOW (ref 3.87–5.11)
RDW: 14.1 % (ref 11.5–15.5)
WBC: 8.7 10*3/uL (ref 4.0–10.5)

## 2015-08-05 NOTE — Progress Notes (Signed)
Pt states is'nt ready to go home tomorrow, she lives all alone and she feels Friday would be better for a rehab center

## 2015-08-05 NOTE — Progress Notes (Signed)
Subjective: 1 Day Post-Op Procedure(s) (LRB): REMOVE HARDWARE RIGHT HIP AND RIGHT TOTAL HIP ARTHROPLASTY ANTERIOR APPROACH (Right) HARDWARE REMOVAL RIGHT FEMUR (Right) Patient reports pain as moderate.  Acute blood loss anemia from surgery.  Vitals stable.  Objective: Vital signs in last 24 hours: Temp:  [97.6 F (36.4 C)-98.6 F (37 C)] 97.9 F (36.6 C) (03/01 0430) Pulse Rate:  [66-84] 76 (03/01 0430) Resp:  [10-20] 16 (03/01 0430) BP: (96-127)/(44-52) 117/50 mmHg (03/01 0430) SpO2:  [95 %-100 %] 99 % (03/01 0430) Weight:  [96.798 kg (213 lb 6.4 oz)] 96.798 kg (213 lb 6.4 oz) (02/28 1302)  Intake/Output from previous day: 02/28 0701 - 03/01 0700 In: 3670 [I.V.:2500; Blood:670; IV Piggyback:500] Out: [id number] [Urine:350; Blood:1200] Intake/Output this shift: Total I/O In: -  Out: 50 [Urine:50]   Recent Labs  08/05/15 0430  HGB 8.9*    Recent Labs  08/05/15 0430  WBC 8.7  RBC 3.12*  HCT 26.6*  PLT 284    Recent Labs  08/05/15 0430  NA 135  K 3.9  CL 99*  CO2 27  BUN <5*  CREATININE 0.62  GLUCOSE 135*  CALCIUM 8.3*   No results for input(s): LABPT, INR in the last 72 hours.  Sensation intact distally Intact pulses distally Dorsiflexion/Plantar flexion intact Incision: scant drainage  Assessment/Plan: 1 Day Post-Op Procedure(s) (LRB): REMOVE HARDWARE RIGHT HIP AND RIGHT TOTAL HIP ARTHROPLASTY ANTERIOR APPROACH (Right) HARDWARE REMOVAL RIGHT FEMUR (Right) Up with therapy - WBAT right hip Follow H/H closely.  Anetha Slagel Y 08/05/2015, 6:56 AM

## 2015-08-05 NOTE — Progress Notes (Signed)
Utilization review completed.  

## 2015-08-05 NOTE — Evaluation (Signed)
Physical Therapy Evaluation Patient Details Name: Lori Bradford DOB: [birth year] Today's Date: 08/05/2015   History of Present Illness  70 yo female with failed fixation of right hip fx, now s/p R THA anterior approach.  Clinical Impression  Patient demonstrates deficits in functional mobility as indicated below. Will need continued skilled PT to address deficits and maximize function. Will see as indicated and progress as tolerated.Patient with history of mechanical falls prior to this admission. Patient with increased anxiety and fear of falling causing self limiting throughout session. Increased physical assist for all aspects of mobility. Will likely need ST SNF upon acute discharge to ensure safety with mobility.     Follow Up Recommendations SNF;Supervision/Assistance - 24 hour    Equipment Recommendations  None recommended by PT    Recommendations for Other Services       Precautions / Restrictions Precautions Precautions: Fall Restrictions Weight Bearing Restrictions: Yes RLE Weight Bearing: Weight bearing as tolerated      Mobility  Bed Mobility Overal bed mobility: Needs Assistance Bed Mobility: Supine to Sit     Supine to sit: Max assist     General bed mobility comments: patient very limited by pain, patient unable to move right LE to EOB stating she was in too much pain and too stiff, assist with BLE to EOB, utilized chuck pad to rotate trunk and increased physical assist to elevate trunk to upright at EOB. Patient with resistance of upright posture initially  Transfers Overall transfer level: Needs assistance Equipment used: Rolling walker (2 wheeled) Transfers: Sit to/from Stand Sit to Stand: Min assist         General transfer comment: Min assist to power up to standing from bed, MAX cues for encouragement. Patient very anxious and resistive  Ambulation/Gait Ambulation/Gait assistance: Min assist Ambulation Distance (Feet): 4  Feet Assistive device: Rolling walker (2 wheeled) Gait Pattern/deviations: Step-to pattern;Shuffle Gait velocity: decreased Gait velocity interpretation: <1.8 ft/sec, indicative of risk for recurrent falls General Gait Details: patient required manaul assist to initiate weight shift to the left and advancement of RLE. Max cues for encouragement. heavy reliance on RW  Stairs            Wheelchair Mobility    Modified Rankin (Stroke Patients Only)       Balance Overall balance assessment: History of Falls                                           Pertinent Vitals/Pain Pain Assessment: 0-10 Pain Score: 7  Pain Location: right hip Pain Descriptors / Indicators: Grimacing;Moaning;Sharp Pain Intervention(s): Limited activity within patient's tolerance;Monitored during session;Repositioned    Home Living Family/patient expects to be discharged to:: Private residence Living Arrangements: Alone Available Help at Discharge: Family Type of Home: Mobile home Home Access: Stairs to enter Entrance Stairs-Rails: Can reach both Entrance Stairs-Number of Steps: 2 Home Layout: One level Home Equipment: Walker - 2 wheels      Prior Function Level of Independence: Needs assistance         Comments: was initially independent with RW, but recently has been limited to use of wheel chair secondary to increased pain     Hand Dominance   Dominant Hand: Right    Extremity/Trunk Assessment   Upper Extremity Assessment: Overall WFL for tasks assessed           Lower  Extremity Assessment: Generalized weakness;RLE deficits/detail      Cervical / Trunk Assessment:  (increased body habitus)  Communication   Communication: No difficulties  Cognition Arousal/Alertness: Awake/alert Behavior During Therapy: Anxious Overall Cognitive Status: No family/caregiver present to determine baseline cognitive functioning                      General Comments       Exercises        Assessment/Plan    PT Assessment Patient needs continued PT services  PT Diagnosis Difficulty walking;Abnormality of gait;Generalized weakness;Acute pain   PT Problem List Decreased strength;Decreased range of motion;Decreased activity tolerance;Decreased balance;Decreased mobility;Decreased coordination;Decreased safety awareness;Pain  PT Treatment Interventions DME instruction;Gait training;Stair training;Functional mobility training;Therapeutic activities;Therapeutic exercise;Balance training;Patient/family education   PT Goals (Current goals can be found in the Care Plan section) Acute Rehab PT Goals Patient Stated Goal: to not be in pain PT Goal Formulation: With patient Time For Goal Achievement: 08/19/15 Potential to Achieve Goals: Fair    Frequency 7X/week   Barriers to discharge        Co-evaluation               End of Session Equipment Utilized During Treatment: Gait belt Activity Tolerance: Patient limited by pain Patient left: in chair;with call bell/phone within reach Nurse Communication: Mobility status         Time: [id number] PT Time Calculation (min) (ACUTE ONLY): 18 min   Charges:   PT Evaluation $PT Eval Moderate Complexity: 1 Procedure     PT G CodesDuncan Dull 08/22/2015, 1:36 PM Alben Deeds, [place] DPT  [phone]

## 2015-08-05 NOTE — Anesthesia Postprocedure Evaluation (Signed)
Anesthesia Post Note  Patient: Lori Bradford  Procedure(s) Performed: Procedure(s) (LRB): REMOVE HARDWARE RIGHT HIP AND RIGHT TOTAL HIP ARTHROPLASTY ANTERIOR APPROACH (Right) HARDWARE REMOVAL RIGHT FEMUR (Right)  Patient location during evaluation: PACU Anesthesia Type: General Level of consciousness: awake and alert Pain management: pain level controlled Vital Signs Assessment: post-procedure vital signs reviewed and stable Respiratory status: spontaneous breathing, nonlabored ventilation, respiratory function stable and patient connected to nasal cannula oxygen Cardiovascular status: blood pressure returned to baseline and stable Postop Assessment: no signs of nausea or vomiting Anesthetic complications: no    Last Vitals:  Filed Vitals:   08/05/15 0100 08/05/15 0430  BP: 96/45 117/50  Pulse: 68 76  Temp: 37 C 36.6 C  Resp: 13 16    Last Pain:  Filed Vitals:   08/05/15 0902  PainSc: [place] Edward Chanon Loney

## 2015-08-05 NOTE — Clinical Social Work Note (Addendum)
CSW received consult for SNF placement, patient requesting to go to SNF, CSW to follow up with patient in the morning.  Jones Broom. Little Browning, MSW, [place] 08/05/2015 6:30 PM

## 2015-08-05 NOTE — Care Management Note (Signed)
Case Management Note  Patient Details  Name: DIYORA BRODBECK MRN: [id number] Date of Birth: 01/03/1946  Subjective/Objective:       S/p right total hip arthroplasty             Action/Plan: PT recommending SNF. Referral made to CSW. Will continue to folow.  Expected Discharge Date:                  Expected Discharge Plan:  Skilled Nursing Facility  In-House Referral:  Clinical Social Work  Discharge planning Services  CM Consult  Post Acute Care Choice:  NA Choice offered to:     DME Arranged:  N/A DME Agency:     HH Arranged:  NA HH Agency:     Status of Service:  In process, will continue to follow  Medicare Important Message Given:    Date Medicare IM Given:    Medicare IM give by:    Date Additional Medicare IM Given:    Additional Medicare Important Message give by:     If discussed at [place] of Stay Meetings, dates discussed:    Additional Comments:  Nila Nephew, RN 08/05/2015, 1:58 PM

## 2015-08-06 LAB — CBC
HCT: 22.8 % — ABNORMAL LOW (ref 36.0–46.0)
HEMOGLOBIN: 7.8 g/dL — AB (ref 12.0–15.0)
MCH: 29.5 pg (ref 26.0–34.0)
MCHC: 34.2 g/dL (ref 30.0–36.0)
MCV: 86.4 fL (ref 78.0–100.0)
PLATELETS: 224 10*3/uL (ref 150–400)
RBC: 2.64 MIL/uL — AB (ref 3.87–5.11)
RDW: 14.3 % (ref 11.5–15.5)
WBC: 12.2 10*3/uL — ABNORMAL HIGH (ref 4.0–10.5)

## 2015-08-06 LAB — PREPARE RBC (CROSSMATCH)

## 2015-08-06 MED ORDER — ENSURE ENLIVE PO LIQD
237.0000 mL | Freq: Two times a day (BID) | ORAL | Status: DC
  Administered 2015-08-07 (×2): 237 mL via ORAL

## 2015-08-06 MED ORDER — SODIUM CHLORIDE 0.9 % IV SOLN: Freq: Once | INTRAVENOUS | Status: DC

## 2015-08-06 MED ORDER — SODIUM CHLORIDE 0.9 % IV SOLN
Freq: Once | INTRAVENOUS | Status: AC
  Administered 2015-08-06: 11:00:00 via INTRAVENOUS

## 2015-08-06 NOTE — Progress Notes (Signed)
Physical Therapy Treatment Patient Details Name: Lori Bradford MRN: [id number] DOB: 1945-10-09 Today's Date: 08/06/2015    History of Present Illness 70 yo female with failed fixation of right hip fx, now s/p R THA anterior approach.    PT Comments    Patient seen in conjunction with OT for use progression of mobility, use of BSC and OOB to chair. Patient remains reluctant with therapy progression and modestly self limiting. At this time, patient required +2 for safety (chair follow) due to limited activity tolerance. Continue to recommend ST SNF.   Follow Up Recommendations  SNF;Supervision/Assistance - 24 hour     Equipment Recommendations  None recommended by PT    Recommendations for Other Services       Precautions / Restrictions Precautions Precautions: Fall Restrictions Weight Bearing Restrictions: Yes RLE Weight Bearing: Weight bearing as tolerated    Mobility  Bed Mobility Overal bed mobility: Needs Assistance Bed Mobility: Supine to Sit     Supine to sit: Mod assist;HOB elevated     General bed mobility comments: Mod assist to move RLE off bed, to scoot hips to EOB and for truncal support to come to sitting position. Verbal cues for safe hand placement and to ease pt's anxiousness about mobilizing  Transfers Overall transfer level: Needs assistance Equipment used: Rolling walker (2 wheeled) Transfers: Sit to/from [facility] Sit to Stand: Mod assist;+2 safety/equipment Stand pivot transfers: Mod assist;+2 safety/equipment       General transfer comment: Mod assist for boost to stand, to stabilize balance and safety. Verbal cues for safe hand placement and sequencing with RW. +2 assist to follow with chair as pt could not tolerate many steps before needing to sit down  Ambulation/Gait Ambulation/Gait assistance: Min assist Ambulation Distance (Feet): 10 Feet Assistive device: Rolling walker (2 wheeled) Gait Pattern/deviations: Step-to  pattern;Shuffle Gait velocity: decreased   General Gait Details: required chair follow, patient with increased pain and limited mobility again this session. VCs for sequencing with RW and positioning/posture.    Stairs            Wheelchair Mobility    Modified Rankin (Stroke Patients Only)       Balance Overall balance assessment: Needs assistance Sitting-balance support: No upper extremity supported;Feet supported Sitting balance-Leahy Scale: Fair     Standing balance support: Bilateral upper extremity supported;During functional activity Standing balance-Leahy Scale: Fair                      Cognition Arousal/Alertness: Awake/alert Behavior During Therapy: Anxious Overall Cognitive Status: Within Functional Limits for tasks assessed                      Exercises      General Comments        Pertinent Vitals/Pain Pain Assessment: Faces Faces Pain Scale: Hurts even more Pain Location: R hip Pain Descriptors / Indicators: Grimacing;Guarding;Sore Pain Intervention(s): Limited activity within patient's tolerance;Monitored during session;Repositioned    Home Living Family/patient expects to be discharged to:: Private residence Living Arrangements: Alone Available Help at Discharge: Family Type of Home: Mobile home Home Access: Stairs to enter Entrance Stairs-Rails: Can reach both Home Layout: One level Home Equipment: Walker - 2 wheels      Prior Function Level of Independence: Needs assistance      Comments: was initially independent with RW, but recently has been limited to use of wheel chair secondary to increased pain   PT Goals (current  goals can now be found in the care plan section) Acute Rehab PT Goals Patient Stated Goal: to not be in pain PT Goal Formulation: With patient Time For Goal Achievement: 08/19/15 Potential to Achieve Goals: Fair Progress towards PT goals: Progressing toward goals    Frequency  7X/week     PT Plan Current plan remains appropriate    Co-evaluation PT/OT/SLP Co-Evaluation/Treatment: Yes Reason for Co-Treatment: Complexity of the patient's impairments (multi-system involvement);For patient/therapist safety PT goals addressed during session: Mobility/safety with mobility OT goals addressed during session: ADL's and self-care;Proper use of Adaptive equipment and DME     End of Session Equipment Utilized During Treatment: Gait belt Activity Tolerance: Patient limited by pain Patient left: in chair;with call bell/phone within reach     Time: 1208-1229 PT Time Calculation (min) (ACUTE ONLY): 21 min  Charges:  $Therapeutic Activity: 8-22 mins                    G CodesDuncan Dull 08/23/15, 1:39 PM Alben Deeds, [place] DPT  [phone]

## 2015-08-06 NOTE — Progress Notes (Signed)
Patient ID: Lori Bradford, female   DOB: October 04, 1945, 70 y.o.   MRN: [id number] Plan on SNF likely tomorrow.  Has acute blood loss anemia from surgery.  Will transfuse blood today.  Continue therapy.

## 2015-08-06 NOTE — Clinical Social Work Note (Signed)
CSW met with patient regarding SNF placement, patient expressed that she is interested in going to SNF for short term rehab.  CSW to begin bed search process.  CSW explained to patient what to expect and role of Education officer, museum.  Formal assessment to follow.  Lori Bradford. Robertson, MSW, [place] 08/06/2015 2:00 PM

## 2015-08-06 NOTE — Clinical Social Work Note (Signed)
Clinical Social Work Assessment  Patient Details  Name: Lori Bradford MRN: [id number] Date of Birth: Mar 27, 1946  Date of referral:  08/06/15               Reason for consult:  Facility Placement                Permission sought to share information with:  Facility Sport and exercise psychologist, Family Supports Permission granted to share information::  Yes, Verbal Permission Granted  Name::     Tifney, Jewart Daughter [phone]  Agency::  SNF admissions  Relationship::     Contact Information:     Housing/Transportation Living arrangements for the past 2 months:  Single Family Home Source of Information:  Patient Patient Interpreter Needed:  None Criminal Activity/Legal Involvement Pertinent to Current Situation/Hospitalization:  No - Comment as needed Significant Relationships:  Adult Children Lives with:  Self Do you feel safe going back to the place where you live?  No (Patient feels she needs some short term rehab before she can return back home.) Need for family participation in patient care:  No (Coment)  Care giving concerns:  Patient feels she needs some short term rehab before she is able to return back to SNF.   Social Worker assessment / plan:  Patient is a 70 year old female who lives alone.  Patient is able to make her own decisions and express how she feels.  Patient states she has never been to rehab before, CSW explained to patient what to expect at rehab and what is involved in the bed search process.  Patient was also explained how insurance will pay for her stay at a SNF.  Patient expressed that she feel comfortable with going to a SNF, and she is looking forward to working hard so she can return back home.  Patient expressed she did not have any more questions.  Employment status:  Retired Nurse, adult PT Recommendations:  [place] / Referral to community resources:  [place]  Patient/Family's  Response to care:  Patient in agreement to going to SNF for short term rehab.  Patient/Family's Understanding of and Emotional Response to Diagnosis, Current Treatment, and Prognosis:  Patient is aware of current diagnosis and treatment plan.  Emotional Assessment Appearance:  Appears stated age Attitude/Demeanor/Rapport:    Affect (typically observed):  Appropriate, Pleasant, Stable Orientation:  Oriented to Situation, Oriented to  Time, Oriented to Place, Oriented to Self Alcohol / Substance use:  Not Applicable Psych involvement (Current and /or in the community):  No (Comment)  Discharge Needs  Concerns to be addressed:  No discharge needs identified Readmission within the last 30 days:  No Current discharge risk:  None Barriers to Discharge:  No Barriers Identified   Anell Barr 08/06/2015, 6:46 PM

## 2015-08-06 NOTE — NC FL2 (Signed)
[place] MEDICAID FL2 LEVEL OF CARE SCREENING TOOL     IDENTIFICATION  Patient Name: Lori Bradford Birthdate: July 07, 1945 Sex: female Admission Date (Current Location): 08/04/2015  [place] and [place] Number:  Oval Linsey [id number] [place] and Address:  [facility] [place], [place] [address], [place], [place] [address]      Provider Number: [id number]  Attending Physician Name and Address:  Mcarthur Rossetti, *  Relative Name and Phone Number:  [name], Hanif Daughter [phone]    Current Level of Care: Hospital Recommended Level of Care: [place] Prior Approval Number:    Date Approved/Denied:   PASRR Number: [id number]  Discharge Plan: SNF    Current Diagnoses: Patient Active Problem List   Diagnosis Date Noted  . Painful orthopaedic hardware and failed fixation of previous right hip fracture 08/04/2015  . 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 Hypokalemia 08/01/2015  . Hyponatremia 08/01/2015  . AKI (acute kidney injury) ([place]) 08/01/2015  . Hypomagnesemia 08/01/2015  . Electrolyte abnormality 07/31/2015  . Wound, open, foot with complication 0000000  . Degenerative arthritis of thumb 11/22/2014  . Skin lesion 07/03/2014  . Status post total bilateral knee replacement 04/04/2014  . Rotator cuff syndrome of both shoulders 12/16/2013  . Osteopenia   . Snoring 09/10/2012  . Screening for osteoporosis 09/10/2012  . Obesity 09/10/2012  . GERD (gastroesophageal reflux disease) 09/10/2012  . Prediabetes 03/14/2012  . Normocytic anemia 12/29/2011  . Preventative health care 09/06/2011  . INSOMNIA, CHRONIC, MILD 06/07/2010  . ANXIETY DEPRESSION 09/07/2006  . Essential hypertension 07/03/2006    Orientation RESPIRATION BLADDER Height & Weight     Self, Time, Situation, Place  Normal Continent Weight: 213 lb 6.4 oz (96.798 kg) Height:     BEHAVIORAL SYMPTOMS/MOOD NEUROLOGICAL BOWEL NUTRITION STATUS   Continent  (Regular)  AMBULATORY STATUS COMMUNICATION OF NEEDS Skin   Limited Assist Verbally Surgical wounds                       Personal Care Assistance Level of Assistance  Bathing, Dressing Bathing Assistance: Limited assistance   Dressing Assistance: Limited assistance     Functional Limitations Info             SPECIAL CARE FACTORS FREQUENCY  PT (By licensed PT)     PT Frequency: 5x a week              Contractures      Additional Factors Info  Code Status, Allergies Code Status Info: Full Code Allergies Info: PENICILLINS           Current Medications (08/06/2015):  This is the current hospital active medication list Current Facility-Administered Medications  Medication Dose Route Frequency Provider Last Rate Last Dose  . 0.9 %  sodium chloride infusion   Intravenous Continuous Mcarthur Rossetti, MD 75 mL/hr at 08/05/15 0007    . 0.9 %  sodium chloride infusion   Intravenous Once Mcarthur Rossetti, MD      . acetaminophen (TYLENOL) tablet 650 mg  650 mg Oral Q6H PRN Mcarthur Rossetti, MD   650 mg at 08/06/15 1031   Or  . acetaminophen (TYLENOL) suppository 650 mg  650 mg Rectal Q6H PRN Mcarthur Rossetti, MD      . ALPRAZolam Duanne Moron) tablet 1 mg  1 mg Oral QHS Mcarthur Rossetti, MD   1 mg at 08/05/15 2154  . alum & mag hydroxide-simeth (MAALOX/MYLANTA) 200-200-20 MG/5ML suspension  30 mL  30 mL Oral Q4H PRN Mcarthur Rossetti, MD      . aspirin EC tablet 325 mg  325 mg Oral BID PC Mcarthur Rossetti, MD   325 mg at 08/06/15 1639  . diphenhydrAMINE (BENADRYL) 12.5 MG/5ML elixir 12.5-25 mg  12.5-25 mg Oral Q4H PRN Mcarthur Rossetti, MD      . docusate sodium (COLACE) capsule 100 mg  100 mg Oral BID Mcarthur Rossetti, MD   100 mg at 08/06/15 0818  . feeding supplement (ENSURE ENLIVE) (ENSURE ENLIVE) liquid 237 mL  237 mL Oral BID BM Mcarthur Rossetti, MD      . HYDROmorphone (DILAUDID) injection 1 mg  1 mg  Intravenous Q2H PRN Mcarthur Rossetti, MD      . menthol-cetylpyridinium (CEPACOL) lozenge 3 mg  1 lozenge Oral PRN Mcarthur Rossetti, MD       Or  . phenol (CHLORASEPTIC) mouth spray 1 spray  1 spray Mouth/Throat PRN Mcarthur Rossetti, MD      . methocarbamol (ROBAXIN) tablet 500 mg  500 mg Oral Q6H PRN Mcarthur Rossetti, MD   500 mg at 08/06/15 [id number]   Or  . methocarbamol (ROBAXIN) 500 mg in dextrose 5 % 50 mL IVPB  500 mg Intravenous Q6H PRN Mcarthur Rossetti, MD      . metoCLOPramide (REGLAN) tablet 5-10 mg  5-10 mg Oral Q8H PRN Mcarthur Rossetti, MD       Or  . metoCLOPramide (REGLAN) injection 5-10 mg  5-10 mg Intravenous Q8H PRN Mcarthur Rossetti, MD      . ondansetron [facility]) tablet 4 mg  4 mg Oral Q6H PRN Mcarthur Rossetti, MD       Or  . ondansetron [facility]) injection 4 mg  4 mg Intravenous Q6H PRN Mcarthur Rossetti, MD      . oxyCODONE (Oxy IR/ROXICODONE) immediate release tablet 5-10 mg  5-10 mg Oral Q3H PRN Mcarthur Rossetti, MD   10 mg at 08/06/15 1426  . pantoprazole (PROTONIX) EC tablet 40 mg  40 mg Oral Daily Mcarthur Rossetti, MD   40 mg at 08/06/15 [id number]     Discharge Medications: Please see discharge summary for a list of discharge medications.  Relevant Imaging Results:  Relevant Lab Results:   Additional Information SSN [id number]  Ross Ludwig, [place]

## 2015-08-06 NOTE — Progress Notes (Signed)
Subjective: 2 Days Post-Op Procedure(s) (LRB): REMOVE HARDWARE RIGHT HIP AND RIGHT TOTAL HIP ARTHROPLASTY ANTERIOR APPROACH (Right) HARDWARE REMOVAL RIGHT FEMUR (Right) Patient reports pain as moderate.  Acute blood loss anemia from surgery.  Objective: Vital signs in last 24 hours: Temp:  [97.4 F (36.3 C)-98.7 F (37.1 C)] 97.5 F (36.4 C) (03/02 0457) Pulse Rate:  [77-91] 77 (03/02 0457) Resp:  [16-17] 16 (03/02 0457) BP: (103-113)/(30-44) 113/40 mmHg (03/02 0457) SpO2:  [94 %-98 %] 97 % (03/02 0457)  Intake/Output from previous day: 03/01 0701 - 03/02 0700 In: 480 [P.O.:480] Out: 200 [Urine:200] Intake/Output this shift:     Recent Labs  08/05/15 0430 08/06/15 0400  HGB 8.9* 7.8*    Recent Labs  08/05/15 0430 08/06/15 0400  WBC 8.7 12.2*  RBC 3.12* 2.64*  HCT 26.6* 22.8*  PLT 284 224    Recent Labs  08/05/15 0430  NA 135  K 3.9  CL 99*  CO2 27  BUN <5*  CREATININE 0.62  GLUCOSE 135*  CALCIUM 8.3*   No results for input(s): LABPT, INR in the last 72 hours.  Sensation intact distally Intact pulses distally Dorsiflexion/Plantar flexion intact Incision: moderate drainage No cellulitis present  Assessment/Plan: 2 Days Post-Op Procedure(s) (LRB): REMOVE HARDWARE RIGHT HIP AND RIGHT TOTAL HIP ARTHROPLASTY ANTERIOR APPROACH (Right) HARDWARE REMOVAL RIGHT FEMUR (Right) Up with therapy Plan for discharge tomorrow Discharge to SNF  Transfusion today.  BLACKMAN,CHRISTOPHER Y 08/06/2015, 9:25 AM

## 2015-08-06 NOTE — Progress Notes (Signed)
Occupational Therapy Evaluation Patient Details Name: Lori Bradford MRN: [id number] DOB: 1946-04-18 Today's Date: 08/06/2015    History of Present Illness 70 yo female with failed fixation of right hip fx, now s/p R THA anterior approach.   Clinical Impression   PTA, pt recently required assistance for ADLs (unsure of how much assist specifically) and has recently been using wc for mobility due to pain. Pt currently requires mod assist +2 (for follow with chair) to complete functional transfers and mod assist for LB ADLs. Began education on compensatory strategies for ADLs, transfers and safety. Recommending SNF for post-acute rehab stay to maximize pt's independence and safety with ADLs and mobility before returning home. Will continue to follow acutely to address OT needs and goals.    Follow Up Recommendations  SNF    Equipment Recommendations  Other (comment) (TBD in next venue)    Recommendations for Other Services       Precautions / Restrictions Precautions Precautions: Fall Restrictions Weight Bearing Restrictions: Yes RLE Weight Bearing: Weight bearing as tolerated      Mobility Bed Mobility Overal bed mobility: Needs Assistance Bed Mobility: Supine to Sit     Supine to sit: Mod assist;HOB elevated     General bed mobility comments: Mod assist to move RLE off bed, to scoot hips to EOB and for truncal support to come to sitting position. Verbal cues for safe hand placement and to ease pt's anxiousness about mobilizing  Transfers Overall transfer level: Needs assistance Equipment used: Rolling walker (2 wheeled) Transfers: Sit to/from [facility] Sit to Stand: Mod assist;+2 safety/equipment Stand pivot transfers: Mod assist;+2 safety/equipment       General transfer comment: Mod assist for boost to stand, to stabilize balance and safety. Verbal cues for safe hand placement and sequencing with RW. +2 assist to follow with chair as pt could not  tolerate many steps before needing to sit down    Balance Overall balance assessment: Needs assistance Sitting-balance support: No upper extremity supported;Feet supported Sitting balance-Leahy Scale: Fair     Standing balance support: Bilateral upper extremity supported;During functional activity Standing balance-Leahy Scale: Fair                              ADL Overall ADL's : Needs assistance/impaired                         Toilet Transfer: Moderate assistance;+2 for safety/equipment;Ambulation;BSC;Cueing for safety   Toileting- Clothing Manipulation and Hygiene: Maximal assistance;Sit to/from stand       Functional mobility during ADLs: Minimal assistance;+2 for safety/equipment;Rolling walker General ADL Comments: Pt very anxious and demonstrates self-limiting behavior.      Vision Vision Assessment?: No apparent visual deficits   Perception     Praxis      Pertinent Vitals/Pain Pain Assessment: Faces Faces Pain Scale: Hurts even more Pain Location: R hip Pain Descriptors / Indicators: Grimacing;Guarding;Sore Pain Intervention(s): Limited activity within patient's tolerance;Monitored during session;Repositioned     Hand Dominance Right   Extremity/Trunk Assessment Upper Extremity Assessment Upper Extremity Assessment: Overall WFL for tasks assessed   Lower Extremity Assessment Lower Extremity Assessment: Generalized weakness;RLE deficits/detail RLE Deficits / Details: decreased ROM and strength as expected post op   Cervical / Trunk Assessment Cervical / Trunk Assessment: Normal   Communication Communication Communication: No difficulties   Cognition Arousal/Alertness: Awake/alert Behavior During Therapy: Anxious Overall Cognitive Status: Within  Functional Limits for tasks assessed                     General Comments       Exercises       Shoulder Instructions      Home Living Family/patient expects to be  discharged to:: Private residence Living Arrangements: Alone Available Help at Discharge: Family Type of Home: Mobile home Home Access: Stairs to enter Technical brewer of Steps: 2 Entrance Stairs-Rails: Can reach both Home Layout: One level     Bathroom Shower/Tub: Occupational psychologist: Standard     Home Equipment: Environmental consultant - 2 wheels          Prior Functioning/Environment Level of Independence: Needs assistance        Comments: was initially independent with RW, but recently has been limited to use of wheel chair secondary to increased pain    OT Diagnosis: Generalized weakness;Acute pain   OT Problem List: Decreased strength;Decreased range of motion;Decreased activity tolerance;Impaired balance (sitting and/or standing);Decreased coordination;Decreased safety awareness;Decreased knowledge of use of DME or AE;Decreased knowledge of precautions;Pain   OT Treatment/Interventions: Self-care/ADL training;Therapeutic exercise;Energy conservation;DME and/or AE instruction;Therapeutic activities;Patient/family education;Balance training    OT Goals(Current goals can be found in the care plan section) Acute Rehab OT Goals Patient Stated Goal: to not be in pain OT Goal Formulation: With patient Time For Goal Achievement: 08/20/15 Potential to Achieve Goals: Good ADL Goals Pt Will Perform Grooming: with min guard assist;standing Pt Will Perform Lower Body Dressing: with min guard assist;sitting/lateral leans;sit to/from stand Pt Will Transfer to Toilet: with min guard assist;ambulating;bedside commode Pt Will Perform Toileting - Clothing Manipulation and hygiene: with min guard assist;sitting/lateral leans;sit to/from stand  OT Frequency: Min 2X/week   Barriers to D/C:            Co-evaluation PT/OT/SLP Co-Evaluation/Treatment: Yes Reason for Co-Treatment: Complexity of the patient's impairments (multi-system involvement);For patient/therapist safety    OT goals addressed during session: ADL's and self-care;Proper use of Adaptive equipment and DME      End of Session Equipment Utilized During Treatment: Gait belt;Rolling walker Nurse Communication: Mobility status  Activity Tolerance: Patient limited by pain Patient left: in chair;with call bell/phone within reach   Time: 1210-1229 OT Time Calculation (min): 19 min Charges:  OT General Charges $OT Visit: 1 Procedure OT Evaluation $OT Eval Moderate Complexity: 1 Procedure G-Codes:    Redmond Baseman, OTR/L [phone] 08/06/2015, 1:08 PM

## 2015-08-06 NOTE — Progress Notes (Signed)
Initial Nutrition Assessment  DOCUMENTATION CODES:   Obesity unspecified  INTERVENTION:  Provide Ensure Enlive po BID, each supplement provides 350 kcal and 20 grams of protein.  Encourage adequate PO intake.   NUTRITION DIAGNOSIS:   Increased nutrient needs related to  (surgery) as evidenced by estimated needs.  GOAL:   Patient will meet greater than or equal to 90% of their needs  MONITOR:   PO intake, Supplement acceptance, Weight trends, Labs, I & O's  REASON FOR ASSESSMENT:   Malnutrition Screening Tool    ASSESSMENT:   70 yo female who sustained a mechanical fall this past December. Was diagnosed with a right hip fracture and underwent fixation in [place]. Over the past month, she developed worsening right hip pain and x-rays were obtained of her right hip showing failure of fixation of her right hip. The compression screw has cut out at this point and further surgery has been recommended.  Procedure(3/1): REMOVE HARDWARE RIGHT HIP AND RIGHT TOTAL HIP ARTHROPLASTY ANTERIOR APPROACH (Right) HARDWARE REMOVAL RIGHT FEMUR (Right)  Pt reports appetite is fine. Meal completion has been 25-50%. She reports usually consuming at least 2 meals a day. Pt does report unintentional weight loss of 20 lbs over the past 2 months, however is happy about it reporting he has been wanting to lose weight. Pt with a 8.5% weight loss in 2 months. Pt is agreeable to Ensure to aid in caloric and protein needs. RD to order. Pt was encouraged to eat her food at meals.   Pt with no observed significant fat or muscle mass loss.   Labs and medications reviewed.   Diet Order:  Diet regular Room service appropriate?: Yes; Fluid consistency:: Thin  Skin:   (Incision on right hip and thigh)  Last BM:  2/26  Height:   Ht Readings from Last 1 Encounters:  08/01/15 5' 2.5" (1.588 m)    Weight:   Wt Readings from Last 1 Encounters:  08/04/15 213 lb 6.4 oz (96.798 kg)    Ideal Body  Weight:  51.14 kg  BMI:  Body mass index is 38.39 kg/(m^2).  Estimated Nutritional Needs:   Kcal:  1700-1900  Protein:  95-105 grams  Fluid:  1.7 - 1.9 L/day  EDUCATION NEEDS:   No education needs identified at this time  Corrin Parker, MS, RD, LDN Pager # [phone] After hours/ weekend pager # [phone]

## 2015-08-07 ENCOUNTER — Encounter (HOSPITAL_COMMUNITY): Payer: Self-pay | Admitting: Orthopaedic Surgery

## 2015-08-07 DIAGNOSIS — R262 Difficulty in walking, not elsewhere classified: Secondary | ICD-10-CM | POA: Diagnosis not present

## 2015-08-07 DIAGNOSIS — M6281 Muscle weakness (generalized): Secondary | ICD-10-CM | POA: Diagnosis not present

## 2015-08-07 DIAGNOSIS — Z96641 Presence of right artificial hip joint: Secondary | ICD-10-CM | POA: Diagnosis not present

## 2015-08-07 DIAGNOSIS — S72009A Fracture of unspecified part of neck of unspecified femur, initial encounter for closed fracture: Secondary | ICD-10-CM | POA: Diagnosis not present

## 2015-08-07 DIAGNOSIS — Z741 Need for assistance with personal care: Secondary | ICD-10-CM | POA: Diagnosis not present

## 2015-08-07 DIAGNOSIS — D509 Iron deficiency anemia, unspecified: Secondary | ICD-10-CM | POA: Diagnosis not present

## 2015-08-07 DIAGNOSIS — K219 Gastro-esophageal reflux disease without esophagitis: Secondary | ICD-10-CM | POA: Diagnosis not present

## 2015-08-07 DIAGNOSIS — M25559 Pain in unspecified hip: Secondary | ICD-10-CM | POA: Diagnosis not present

## 2015-08-07 DIAGNOSIS — M1711 Unilateral primary osteoarthritis, right knee: Secondary | ICD-10-CM | POA: Diagnosis not present

## 2015-08-07 DIAGNOSIS — Z471 Aftercare following joint replacement surgery: Secondary | ICD-10-CM | POA: Diagnosis not present

## 2015-08-07 DIAGNOSIS — I10 Essential (primary) hypertension: Secondary | ICD-10-CM | POA: Diagnosis not present

## 2015-08-07 LAB — CBC
HCT: 25.3 % — ABNORMAL LOW (ref 36.0–46.0)
Hemoglobin: 8.5 g/dL — ABNORMAL LOW (ref 12.0–15.0)
MCH: 27.9 pg (ref 26.0–34.0)
MCHC: 33.6 g/dL (ref 30.0–36.0)
MCV: 83 fL (ref 78.0–100.0)
PLATELETS: 168 10*3/uL (ref 150–400)
RBC: 3.05 MIL/uL — ABNORMAL LOW (ref 3.87–5.11)
RDW: 16.9 % — AB (ref 11.5–15.5)
WBC: 13.2 10*3/uL — ABNORMAL HIGH (ref 4.0–10.5)

## 2015-08-07 MED ORDER — OXYCODONE-ACETAMINOPHEN 5-325 MG PO TABS: 2.0000 | ORAL_TABLET | Freq: Four times a day (QID) | ORAL | Status: DC | PRN

## 2015-08-07 MED ORDER — ASPIRIN 325 MG PO TBEC: 325.0000 mg | DELAYED_RELEASE_TABLET | Freq: Every day | ORAL | Status: DC

## 2015-08-07 NOTE — Discharge Instructions (Signed)

## 2015-08-07 NOTE — Progress Notes (Signed)
Patient ID: Lori Bradford, female   DOB: January 24, 1946, 70 y.o.   MRN: [id number] Vitals stable.  Labs note back.  Moderate drainage from right hip incision yesterday.  Dressing changed this morning and looks much better.  Can discharge to SNF this afternoon.

## 2015-08-07 NOTE — Progress Notes (Signed)
Insurance auth received [id number]  Pt dtr will be at [place] at 4pm to complete paperwork- PTAR scheduled for 4pm pickup.  CSW signing off  Domenica Reamer, [place] Social Worker [phone]

## 2015-08-07 NOTE — Clinical Social Work Note (Signed)
Patient will discharge today per MD order. Patient will discharge to [facility] SN[facility] RN to call report prior to transportation to: [phone] Transportation: [facility] -to be scheduled after insurance authorization received.  CSW sent discharge summary to SNF for review.   Nonnie Done, LCSW [phone]  5N1-9, 2S 15-16 and Psychiatric Service Line  Licensed Clinical Social Worker

## 2015-08-07 NOTE — Progress Notes (Signed)
Physical Therapy Treatment Patient Details Name: Lori Bradford MRN: [id number] DOB: 27-Jan-1946 Today's Date: 08/13/15    History of Present Illness 70 yo female with failed fixation of right hip fx, now s/p R THA anterior approach.    PT Comments    Patient agreeable to HEP exercises today. Educated and performed with cues. Hand out provided. Tolerated both supine as well as EOB exercises but continues to require increased encouragement.  Follow Up Recommendations  SNF;Supervision/Assistance - 24 hour     Equipment Recommendations  None recommended by PT    Recommendations for Other Services       Precautions / Restrictions Precautions Precautions: Fall Restrictions Weight Bearing Restrictions: Yes RLE Weight Bearing: Weight bearing as tolerated    Mobility  Bed Mobility Overal bed mobility: Needs Assistance Bed Mobility: Supine to Sit;Sit to Supine     Supine to sit: Mod assist;HOB elevated Sit to supine: Mod assist      Transfers                    Ambulation/Gait                 Stairs            Wheelchair Mobility    Modified Rankin (Stroke Patients Only)       Balance                                    Cognition Arousal/Alertness: Awake/alert Behavior During Therapy: Anxious Overall Cognitive Status: Within Functional Limits for tasks assessed                      Exercises Total Joint Exercises Ankle Circles/Pumps: AROM;10 reps Quad Sets: AROM;10 reps (5 second hold) Gluteal Sets: AROM;10 reps (5 second hold) Short Arc Quad: AROM;10 reps Heel Slides: AAROM;10 reps (with sheet) Hip ABduction/ADduction: AROM;Right;10 reps Long Arc Quad: AROM;10 reps Marching in Standing: AROM;10 reps (marching in sitting )    General Comments        Pertinent Vitals/Pain Pain Assessment: Faces Faces Pain Scale: Hurts even more Pain Location: RLE Pain Descriptors / Indicators:  Grimacing;Guarding;Sore Pain Intervention(s): Limited activity within patient's tolerance;Monitored during session;Repositioned    Home Living                      Prior Function            PT Goals (current goals can now be found in the care plan section) Acute Rehab PT Goals Patient Stated Goal: to not be in pain PT Goal Formulation: With patient Time For Goal Achievement: 08/19/15 Potential to Achieve Goals: Fair Progress towards PT goals: Progressing toward goals    Frequency  7X/week    PT Plan Current plan remains appropriate    Co-evaluation             End of Session   Activity Tolerance: Patient tolerated treatment well Patient left: in bed;with call bell/phone within reach;with bed alarm set     Time: 1420-1442 PT Time Calculation (min) (ACUTE ONLY): 22 min  Charges:  $Therapeutic Exercise: 8-22 mins                    G CodesDuncan Dull 08-13-15, 5:00 PM Alben Deeds, [place] DPT  [phone]

## 2015-08-07 NOTE — Care Management Important Message (Signed)
Important Message  Patient Details  Name: Lori Bradford MRN: [id number] Date of Birth: 03-Dec-1945   Medicare Important Message Given:  Yes    [name] [name] 08/07/2015, 4:14 PM
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
## 2015-08-08 LAB — TYPE AND SCREEN
ABO/RH(D): A POS
ANTIBODY SCREEN: NEGATIVE
UNIT DIVISION: 0
UNIT DIVISION: 0
UNIT DIVISION: 0
Unit division: 0

## 2015-08-10 DIAGNOSIS — Z471 Aftercare following joint replacement surgery: Secondary | ICD-10-CM | POA: Diagnosis not present

## 2015-08-10 DIAGNOSIS — R262 Difficulty in walking, not elsewhere classified: Secondary | ICD-10-CM | POA: Diagnosis not present

## 2015-08-10 DIAGNOSIS — I10 Essential (primary) hypertension: Secondary | ICD-10-CM | POA: Diagnosis not present

## 2015-08-10 DIAGNOSIS — D509 Iron deficiency anemia, unspecified: Secondary | ICD-10-CM | POA: Diagnosis not present

## 2015-08-17 DIAGNOSIS — Z96653 Presence of artificial knee joint, bilateral: Secondary | ICD-10-CM | POA: Diagnosis not present

## 2015-08-17 DIAGNOSIS — I499 Cardiac arrhythmia, unspecified: Secondary | ICD-10-CM | POA: Diagnosis not present

## 2015-08-17 DIAGNOSIS — Z96641 Presence of right artificial hip joint: Secondary | ICD-10-CM | POA: Diagnosis not present

## 2015-08-17 DIAGNOSIS — I10 Essential (primary) hypertension: Secondary | ICD-10-CM | POA: Diagnosis not present

## 2015-08-17 DIAGNOSIS — S72141D Displaced intertrochanteric fracture of right femur, subsequent encounter for closed fracture with routine healing: Secondary | ICD-10-CM | POA: Diagnosis not present

## 2015-08-17 DIAGNOSIS — W19XXXD Unspecified fall, subsequent encounter: Secondary | ICD-10-CM | POA: Diagnosis not present

## 2015-08-19 ENCOUNTER — Encounter: Payer: Self-pay | Admitting: Internal Medicine

## 2015-08-19 ENCOUNTER — Ambulatory Visit (HOSPITAL_COMMUNITY)
Admission: RE | Admit: 2015-08-19 | Discharge: 2015-08-19 | Disposition: A | Discharge status: Home / Self Care | Payer: Commercial Managed Care - HMO | Source: Ambulatory Visit | Attending: Internal Medicine | Admitting: Internal Medicine

## 2015-08-19 ENCOUNTER — Observation Stay
Admission: AD | Admit: 2015-08-19 | Payer: Commercial Managed Care - HMO | Source: Ambulatory Visit | Admitting: Infectious Disease

## 2015-08-19 ENCOUNTER — Ambulatory Visit (INDEPENDENT_AMBULATORY_CARE_PROVIDER_SITE_OTHER): Payer: Commercial Managed Care - HMO | Admitting: Internal Medicine

## 2015-08-19 VITALS — BP 92/46 | HR 85 | Temp 98.1°F | Ht 62.5 in

## 2015-08-19 DIAGNOSIS — R936 Abnormal findings on diagnostic imaging of limbs: Secondary | ICD-10-CM | POA: Insufficient documentation

## 2015-08-19 DIAGNOSIS — D649 Anemia, unspecified: Secondary | ICD-10-CM

## 2015-08-19 DIAGNOSIS — R911 Solitary pulmonary nodule: Secondary | ICD-10-CM

## 2015-08-19 DIAGNOSIS — I10 Essential (primary) hypertension: Secondary | ICD-10-CM

## 2015-08-19 DIAGNOSIS — Z7722 Contact with and (suspected) exposure to environmental tobacco smoke (acute) (chronic): Secondary | ICD-10-CM

## 2015-08-19 DIAGNOSIS — I82402 Acute embolism and thrombosis of unspecified deep veins of left lower extremity: Secondary | ICD-10-CM | POA: Diagnosis not present

## 2015-08-19 DIAGNOSIS — Z13820 Encounter for screening for osteoporosis: Secondary | ICD-10-CM

## 2015-08-19 DIAGNOSIS — R918 Other nonspecific abnormal finding of lung field: Secondary | ICD-10-CM | POA: Insufficient documentation

## 2015-08-19 DIAGNOSIS — M7989 Other specified soft tissue disorders: Secondary | ICD-10-CM

## 2015-08-19 DIAGNOSIS — I82492 Acute embolism and thrombosis of other specified deep vein of left lower extremity: Secondary | ICD-10-CM

## 2015-08-19 HISTORY — DX: Acute embolism and thrombosis of unspecified deep veins of left lower extremity: I82.402

## 2015-08-19 LAB — CBC
HCT: 29.9 % — ABNORMAL LOW (ref 36.0–46.0)
HEMOGLOBIN: 9.4 g/dL — AB (ref 12.0–15.0)
MCH: 26.9 pg (ref 26.0–34.0)
MCHC: 31.4 g/dL (ref 30.0–36.0)
MCV: 85.7 fL (ref 78.0–100.0)
Platelets: 581 10*3/uL — ABNORMAL HIGH (ref 150–400)
RBC: 3.49 MIL/uL — ABNORMAL LOW (ref 3.87–5.11)
RDW: 16 % — ABNORMAL HIGH (ref 11.5–15.5)
WBC: 9.1 10*3/uL (ref 4.0–10.5)

## 2015-08-19 LAB — BASIC METABOLIC PANEL WITH GFR
Anion gap: 14 (ref 5–15)
BUN: <5 mg/dL — ABNORMAL LOW (ref 6–20)
CO2: 29 mmol/L (ref 22–32)
Calcium: 8.7 mg/dL — ABNORMAL LOW (ref 8.9–10.3)
Chloride: 92 mmol/L — ABNORMAL LOW (ref 101–111)
Creatinine, Ser: 0.5 mg/dL (ref 0.44–1.00)
GFR calc Af Amer: >60 mL/min
GFR calc non Af Amer: >60 mL/min
Glucose, Bld: 101 mg/dL — ABNORMAL HIGH (ref 65–99)
Potassium: 3.9 mmol/L (ref 3.5–5.1)
Sodium: 135 mmol/L (ref 135–145)

## 2015-08-19 MED ORDER — FERROUS SULFATE 325 (65 FE) MG PO TABS: 325.0000 mg | ORAL_TABLET | Freq: Every day | ORAL | Status: DC

## 2015-08-19 MED ORDER — CALCIUM CARBONATE-VITAMIN D 500-200 MG-UNIT PO TABS: 2.0000 | ORAL_TABLET | Freq: Every day | ORAL | Status: DC

## 2015-08-19 MED ORDER — RIVAROXABAN 20 MG PO TABS: 20.0000 mg | ORAL_TABLET | Freq: Every day | ORAL | Status: DC

## 2015-08-19 NOTE — Assessment & Plan Note (Signed)
14mm Pulmonary nodule noted on CT scan- 06/2015, Ct was done [place], records in Care everywhere. She is a life long non-smoker, no family hx of malignancies, no personal hx of malignancies, she denies any occupational exposures, but admits to second hand smoke- ~3 hrs about 3 times a week where she plays bingo. She denies alcohol use.   Ct scan does not remark on attenuation or egdes of the nodule, that would increase the likelihood for malig if less solid and if spiculated edges.   Plan- repeat Ct scan in 6 months -12/2015. - Told pt plan, and importance of follow up, she agreed.

## 2015-08-19 NOTE — Assessment & Plan Note (Addendum)
DVt left lower extremity. Provoked. Status post hip surgery. Was given aspirin, which she was complaint with. She has swelling today and warmth, but no tenderness or redness. Pt also hypotensive- Bp- 92/46, concerning for possible PE, but she has not been eating much and has been taking her HCTZ and lisinopril, she was told to stop HCTZ on discharge in 07/2015 due to hypotension.  Plan- Stat Left lower extremity Doppler- called that pt has DVT - Planned admission, but pt declined despite extensively warming her of the dangers of going home, as there was a concern for a PE considering hypotension, pt still declined admission. - Xalreto samples given to patient for initial 21 day supply- 15mg  BID for 21 days, then prescription given to cont with Evalina Field 20mg  daily from the 09/10/2014. First tablet given in clinic. - Pt to come in for follow up in 2 days time, follow up Bp. - Hold Bp meds for now. - Stat Bmet- check Cr, since we are starting Xarelto. - Pt signed AMA form. - Stop aspirin  Xarelto sample from clinic- Lot no- [id number], Expires- 11/2015.

## 2015-08-19 NOTE — Assessment & Plan Note (Addendum)
Hgb improved to 13 in 05/2015. Now hgb- 8.5 status post surgery. Most likley blood loss related, replete with iron. Probably contributing to pts fatigue today, in addition to poor PO intake.  PLan- Oral iron + OTC stool softners - CBC today.  Hgb today improved- 9.5.
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
## 2015-08-19 NOTE — Progress Notes (Signed)
*  PRELIMINARY RESULTS* Vascular Ultrasound Lower extremity venous duplex has been completed.  Preliminary findings: DVT noted in the Left common femoral vein, femoral vein, popliteal vein, posterior tibial veins, and peroneal veins. No DVT RLE.   Called results to resident at clinic. Patient will return to clinic.   Landry Mellow, RDMS, RVT  08/19/2015, 4:41 PM

## 2015-08-19 NOTE — Patient Instructions (Addendum)
Please stop taking your HCTZ for now, because your blood pressure is low.   Also we will be doing some blood work today. So stop taking your lisinopril for now. We will like to see you in 2 weeks, and then decide if you should start taking your Bp mueds. Please increase your appetite.   We have also started you on Iron tablets, please take them once everyday. Also if you get constipated take over the counter doculax or miralax with it everyday.  We will be repeating your CT scan of your chest in July, because the last one you had in January has a small nodule, which in many cases clears up on its own, but in some cases could be cancer.  We will be starting you on a blood thinner for the blood clot in your leg- the blood thinner is called Evalina Field. Take one tablet 2 times a day for 21 days, then take one 20mg  tablet everyday for now, till we tell you to stop. We will like to see you on Friday as your blood pressure is low.   Stop taking the aspirin.

## 2015-08-19 NOTE — Assessment & Plan Note (Signed)
Negative for osteoporosis- 2014. Prescription for Vit D with ca supplimentation daily.

## 2015-08-19 NOTE — Assessment & Plan Note (Signed)
Bp today- 92/46. She has been taking her HCTZ and lisinopril- 20mg  daily, but was told to stop this after admission on 07/2015 for hypotension and hyponatremia. Pt to stop taking this meds for now. She will follo up with [place] in 2 days to ensure her Bp improves.  Plan- stat BMET- Cr and k, also NA.

## 2015-08-20 DIAGNOSIS — S72141S Displaced intertrochanteric fracture of right femur, sequela: Secondary | ICD-10-CM | POA: Diagnosis not present

## 2015-08-20 NOTE — Progress Notes (Signed)
Internal Medicine Clinic Attending  Case discussed with Dr. Emokpae soon after the resident saw the patient.  We reviewed the resident's history and exam and pertinent patient test results.  I agree with the assessment, diagnosis, and plan of care documented in the resident's note. 

## 2015-08-21 ENCOUNTER — Ambulatory Visit (INDEPENDENT_AMBULATORY_CARE_PROVIDER_SITE_OTHER): Payer: Commercial Managed Care - HMO | Admitting: Internal Medicine

## 2015-08-21 ENCOUNTER — Encounter: Payer: Self-pay | Admitting: Internal Medicine

## 2015-08-21 VITALS — BP 123/47 | HR 85 | Temp 98.0°F | Ht 62.5 in | Wt 208.7 lb

## 2015-08-21 DIAGNOSIS — Z7901 Long term (current) use of anticoagulants: Secondary | ICD-10-CM

## 2015-08-21 DIAGNOSIS — I82492 Acute embolism and thrombosis of other specified deep vein of left lower extremity: Secondary | ICD-10-CM

## 2015-08-21 DIAGNOSIS — I824Z2 Acute embolism and thrombosis of unspecified deep veins of left distal lower extremity: Secondary | ICD-10-CM | POA: Diagnosis not present

## 2015-08-21 NOTE — Patient Instructions (Signed)
Lori Bradford, it was a joy seeing you today.  You likely have a clot in your leg. At this point, we think you are safe, but please the review that attached instructions for reasons to go to the hospital. We'll see you in May to assess the resolution of your symptoms and address the discontinuation of Xarelto.  Happy [employer] Patrick's day.  Deep Vein Thrombosis A deep vein thrombosis (DVT) is a blood clot (thrombus) that usually occurs in a deep, larger vein of the lower leg or the pelvis, or in an upper extremity such as the arm. These are dangerous and can lead to serious and even life-threatening complications if the clot travels to the lungs. A DVT can damage the valves in your leg veins so that instead of flowing upward, the blood pools in the lower leg. This is called post-thrombotic syndrome, and it can result in pain, swelling, discoloration, and sores on the leg. CAUSES A DVT is caused by the formation of a blood clot in your leg, pelvis, or arm. Usually, several things contribute to the formation of blood clots. A clot may develop when:  Your blood flow slows down.  Your vein becomes damaged in some way.  You have a condition that makes your blood clot more easily. RISK FACTORS A DVT is more likely to develop in:  People who are older, especially over 2 years of age.  People who are overweight (obese).  People who sit or lie still for a long time, such as during long-distance travel (over 4 hours), bed rest, hospitalization, or during recovery from certain medical conditions like a stroke.  People who do not engage in much physical activity (sedentary lifestyle).  People who have chronic breathing disorders.  People who have a personal or family history of blood clots or blood clotting disease.  People who have peripheral vascular disease (PVD), diabetes, or some types of cancer.  People who have heart disease, especially if the person had a recent heart attack or has congestive  heart failure.  People who have neurological diseases that affect the legs (leg paresis).  People who have had a traumatic injury, such as breaking a hip or leg.  People who have recently had major or lengthy surgery, especially on the hip, knee, or abdomen.  People who have had a central line placed inside a large vein.  People who take medicines that contain the hormone estrogen. These include birth control pills and hormone replacement therapy.  Pregnancy or during childbirth or the postpartum period.  Long plane flights (over 8 hours). SIGNS AND SYMPTOMS Symptoms of a DVT can include:   Swelling of your leg or arm, especially if one side is much worse.  Warmth and redness of your leg or arm, especially if one side is much worse.  Pain in your arm or leg. If the clot is in your leg, symptoms may be more noticeable or worse when you stand or walk.  A feeling of pins and needles, if the clot is in the arm. The symptoms of a DVT that has traveled to the lungs (pulmonary embolism, PE) usually start suddenly and include:  Shortness of breath while active or at rest.  Coughing or coughing up blood or blood-tinged mucus.  Chest pain that is often worse with deep breaths.  Rapid or irregular heartbeat.  Feeling light-headed or dizzy.  Fainting.  Feeling anxious.  Sweating. There may also be pain and swelling in a leg if that is where the  blood clot started. These symptoms may represent a serious problem that is an emergency. Do not wait to see if the symptoms will go away. Get medical help right away. Call your local emergency services (911 in the [place] Do not drive yourself to the hospital. DIAGNOSIS Your health care provider will take a medical history and perform a physical exam. You may also have other tests, including:  Blood tests to assess the clotting properties of your blood.  Imaging tests, such as CT, ultrasound, MRI, X-ray, and other tests to see if you have  clots anywhere in your body. TREATMENT After a DVT is identified, it can be treated. The type of treatment that you receive depends on many factors, such as the cause of your DVT, your risk for bleeding or developing more clots, and other medical conditions that you have. Sometimes, a combination of treatments is necessary. Treatment options may be combined and include:  Monitoring the blood clot with ultrasound.  Taking medicines by mouth, such as newer blood thinners (anticoagulants), thrombolytics, or warfarin.  Taking anticoagulant medicine by injection or through an IV tube.  Wearing compression stockings or using different types ofdevices.  Surgery (rare) to remove the blood clot or to place a filter in your abdomen to stop the blood clot from traveling to your lungs. Treatments for a DVT are often divided into immediate treatment and long-term treatment (up to 3 months after DVT). You can work with your health care provider to choose the treatment program that is best for you. HOME CARE INSTRUCTIONS If you are taking a newer oral anticoagulant:  Take the medicine every single day at the same time each day.  Understand what foods and drugs interact with this medicine.  Understand that there are no regular blood tests required when using this medicine.  Understand the side effects of this medicine, including excessive bruising or bleeding. Ask your health care provider or pharmacist about other possible side effects. If you are taking warfarin:  Understand how to take warfarin and know which foods can affect how warfarin works in Veterinary surgeon.  Understand that it is dangerous to take too much or too little warfarin. Too much warfarin increases the risk of bleeding. Too little warfarin continues to allow the risk for blood clots.  Follow your PT and INR blood testing schedule. The PT and INR results allow your health care provider to adjust your dose of warfarin. It is very important  that you have your PT and INR tested as often as told by your health care provider.  Avoid major changes in your diet, or tell your health care provider before you change your diet. Arrange a visit with a registered dietitian to answer your questions. Many foods, especially foods that are high in vitamin K, can interfere with warfarin and affect the PT and INR results. Eat a consistent amount of foods that are high in vitamin K, such as:  Spinach, kale, broccoli, cabbage, collard greens, turnip greens, Brussels sprouts, peas, cauliflower, seaweed, and parsley.  Beef liver and pork liver.  Green tea.  Soybean oil.  Tell your health care provider about any and all medicines, vitamins, and supplements that you take, including aspirin and other over-the-counter anti-inflammatory medicines. Be especially cautious with aspirin and anti-inflammatory medicines. Do not take those before you ask your health care provider if it is safe to do so. This is important because many medicines can interfere with warfarin and affect the PT and INR results.  Do  not start or stop taking any over-the-counter or prescription medicine unless your health care provider or pharmacist tells you to do so. If you take warfarin, you will also need to do these things:  Hold pressure over cuts for longer than usual.  Tell your dentist and other health care providers that you are taking warfarin before you have any procedures in which bleeding may occur.  Avoid alcohol or drink very small amounts. Tell your health care provider if you change your alcohol intake.  Do not use tobacco products, including cigarettes, chewing tobacco, and e-cigarettes. If you need help quitting, ask your health care provider.  Avoid contact sports. General Instructions  Take over-the-counter and prescription medicines only as told by your health care provider. Anticoagulant medicines can have side effects, including easy bruising and difficulty  stopping bleeding. If you are prescribed an anticoagulant, you will also need to do these things:  Hold pressure over cuts for longer than usual.  Tell your dentist and other health care providers that you are taking anticoagulants before you have any procedures in which bleeding may occur.  Avoid contact sports.  Wear a medical alert bracelet or carry a medical alert card that says you have had a PE.  Ask your health care provider how soon you can go back to your normal activities. Stay active to prevent new blood clots from forming.  Make sure to exercise while traveling or when you have been sitting or standing for a long period of time. It is very important to exercise. Exercise your legs by walking or by tightening and relaxing your leg muscles often. Take frequent walks.  Wear compression stockings as told by your health care provider to help prevent more blood clots from forming.  Do not use tobacco products, including cigarettes, chewing tobacco, and e-cigarettes. If you need help quitting, ask your health care provider.  Keep all follow-up appointments with your health care provider. This is important. PREVENTION Take these actions to decrease your risk of developing another DVT:  Exercise regularly. For at least 30 minutes every day, engage in:  Activity that involves moving your arms and legs.  Activity that encourages good blood flow through your body by increasing your heart rate.  Exercise your arms and legs every hour during long-distance travel (over 4 hours). Drink plenty of water and avoid drinking alcohol while traveling.  Avoid sitting or lying in bed for long periods of time without moving your legs.  Maintain a weight that is appropriate for your height. Ask your health care provider what weight is healthy for you.  If you are a woman who is over 61 years of age, avoid unnecessary use of medicines that contain estrogen. These include birth control pills.  Do  not smoke, especially if you take estrogen medicines. If you need help quitting, ask your health care provider. If you are hospitalized, prevention measures may include:  Early walking after surgery, as soon as your health care provider says that it is safe.  Receiving anticoagulants to prevent blood clots.If you cannot take anticoagulants, other options may be available, such as wearing compression stockings or using different types of devices. SEEK IMMEDIATE MEDICAL CARE IF:  You have new or increased pain, swelling, or redness in an arm or leg.  You have numbness or tingling in an arm or leg.  You have shortness of breath while active or at rest.  You have chest pain.  You have a rapid or irregular heartbeat.  You  feel light-headed or dizzy.  You cough up blood.  You notice blood in your vomit, bowel movement, or urine. These symptoms may represent a serious problem that is an emergency. Do not wait to see if the symptoms will go away. Get medical help right away. Call your local emergency services (911 in the [place] Do not drive yourself to the hospital.  Rivaroxaban oral tablets What is this medicine? RIVAROXABAN (ri va ROX a ban) is an anticoagulant (blood thinner). It is used to treat blood clots in the lungs or in the veins. It is also used after knee or hip surgeries to prevent blood clots. It is also used to lower the chance of stroke in people with a medical condition called atrial fibrillation. This medicine may be used for other purposes; ask your health care provider or pharmacist if you have questions. What should I tell my health care provider before I take this medicine? They need to know if you have any of these conditions: -bleeding disorders -bleeding in the brain -blood in your stools (black or tarry stools) or if you have blood in your vomit -history of stomach bleeding -kidney disease -liver disease -low blood counts, like low white cell, platelet, or red  cell counts -recent or planned spinal or epidural procedure -take medicines that treat or prevent blood clots -an unusual or allergic reaction to rivaroxaban, other medicines, foods, dyes, or preservatives -pregnant or trying to get pregnant -breast-feeding How should I use this medicine? Take this medicine by mouth with a glass of water. Follow the directions on the prescription label. Take your medicine at regular intervals. Do not take it more often than directed. Do not stop taking except on your doctor's advice. Stopping this medicine may increase your risk of a blood clot. Be sure to refill your prescription before you run out of medicine. If you are taking this medicine after hip or knee replacement surgery, take it with or without food. If you are taking this medicine for atrial fibrillation, take it with your evening meal. If you are taking this medicine to treat blood clots, take it with food at the same time each day. If you are unable to swallow your tablet, you may crush the tablet and mix it in applesauce. Then, immediately eat the applesauce. You should eat more food right after you eat the applesauce containing the crushed tablet. Talk to your pediatrician regarding the use of this medicine in children. Special care may be needed. Overdosage: If you think you have taken too much of this medicine contact a poison control center or emergency room at once. NOTE: This medicine is only for you. Do not share this medicine with others. What if I miss a dose? If you take your medicine once a day and miss a dose, take the missed dose as soon as you remember. If you take your medicine twice a day and miss a dose, take the missed dose immediately. In this instance, 2 tablets may be taken at the same time. The next day you should take 1 tablet twice a day as directed. What may interact with this medicine? -aspirin and aspirin-like medicines -certain antibiotics like erythromycin, azithromycin,  and clarithromycin -certain medicines for fungal infections like ketoconazole and itraconazole -certain medicines for irregular heart beat like amiodarone, quinidine, dronedarone -certain medicines for seizures like carbamazepine, phenytoin -certain medicines that treat or prevent blood clots like warfarin, enoxaparin, and dalteparin -conivaptan -diltiazem -felodipine -indinavir -lopinavir; ritonavir -NSAIDS, medicines for pain and inflammation,  like ibuprofen or naproxen -ranolazine -rifampin -ritonavir -St. John's wort -verapamil This list may not describe all possible interactions. Give your health care provider a list of all the medicines, herbs, non-prescription drugs, or dietary supplements you use. Also tell them if you smoke, drink alcohol, or use illegal drugs. Some items may interact with your medicine. What should I watch for while using this medicine? Visit your doctor or health care professional for regular checks on your progress. Your condition will be monitored carefully while you are receiving this medicine. Notify your doctor or health care professional and seek emergency treatment if you develop breathing problems; changes in vision; chest pain; severe, sudden headache; pain, swelling, warmth in the leg; trouble speaking; sudden numbness or weakness of the face, arm, or leg. These can be signs that your condition has gotten worse. If you are going to have surgery, tell your doctor or health care professional that you are taking this medicine. Tell your health care professional that you use this medicine before you have a spinal or epidural procedure. Sometimes people who take this medicine have bleeding problems around the spine when they have a spinal or epidural procedure. This bleeding is very rare. If you have a spinal or epidural procedure while on this medicine, call your health care professional immediately if you have back pain, numbness or tingling (especially in your  legs and feet), muscle weakness, paralysis, or loss of bladder or bowel control. Avoid sports and activities that might cause injury while you are using this medicine. Severe falls or injuries can cause unseen bleeding. Be careful when using sharp tools or knives. Consider using an Copy. Take special care brushing or flossing your teeth. Report any injuries, bruising, or red spots on the skin to your doctor or health care professional. What side effects may I notice from receiving this medicine? Side effects that you should report to your doctor or health care professional as soon as possible: -allergic reactions like skin rash, itching or hives, swelling of the face, lips, or tongue -back pain -redness, blistering, peeling or loosening of the skin, including inside the mouth -signs and symptoms of bleeding such as bloody or black, tarry stools; red or dark-brown urine; spitting up blood or brown material that looks like coffee grounds; red spots on the skin; unusual bruising or bleeding from the eye, gums, or nose Side effects that usually do not require medical attention (Report these to your doctor or health care professional if they continue or are bothersome.): -dizziness -muscle pain This list may not describe all possible side effects. Call your doctor for medical advice about side effects. You may report side effects to FDA at 1-800-FDA-1088. Where should I keep my medicine? Keep out of the reach of children. Store at room temperature between 15 and 30 degrees C (59 and 86 degrees F). Throw away any unused medicine after the expiration date. NOTE: This sheet is a summary. It may not cover all possible information. If you have questions about this medicine, talk to your doctor, pharmacist, or health care provider.    2016, Elsevier/Gold Standard. (2014-05-21 12:45:34)    This information is not intended to replace advice given to you by your health care provider. Make sure you  discuss any questions you have with your health care provider.   Document Released: 05/23/2005 Document Revised: 02/11/2015 Document Reviewed: 09/17/2014 Elsevier Interactive Patient Education [employer].

## 2015-08-21 NOTE — Assessment & Plan Note (Signed)
A: Patient is no longer hypotensive after holding antihypertensives. Not hypoxic and not complaining of dyspnea or chest pain. She has been adherent with Xarelto. This can continued to be managed as an outpatient. Reviewed creatinine, and it is normal.   P: Continue Xarelto for provoked DVT. Will evaluate need for Xarelto at follow-up appt with PCP in May.
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
## 2015-08-24 DIAGNOSIS — Z96653 Presence of artificial knee joint, bilateral: Secondary | ICD-10-CM | POA: Diagnosis not present

## 2015-08-24 DIAGNOSIS — S72141D Displaced intertrochanteric fracture of right femur, subsequent encounter for closed fracture with routine healing: Secondary | ICD-10-CM | POA: Diagnosis not present

## 2015-08-24 DIAGNOSIS — W19XXXD Unspecified fall, subsequent encounter: Secondary | ICD-10-CM | POA: Diagnosis not present

## 2015-08-24 DIAGNOSIS — I10 Essential (primary) hypertension: Secondary | ICD-10-CM | POA: Diagnosis not present

## 2015-08-24 DIAGNOSIS — I499 Cardiac arrhythmia, unspecified: Secondary | ICD-10-CM | POA: Diagnosis not present

## 2015-08-24 DIAGNOSIS — Z96641 Presence of right artificial hip joint: Secondary | ICD-10-CM | POA: Diagnosis not present

## 2015-08-24 NOTE — Progress Notes (Signed)
Internal Medicine Clinic Attending  Case discussed with Dr. Ford at the time of the visit.  We reviewed the resident's history and exam and pertinent patient test results.  I agree with the assessment, diagnosis, and plan of care documented in the resident's note.  

## 2015-08-26 DIAGNOSIS — Z96653 Presence of artificial knee joint, bilateral: Secondary | ICD-10-CM | POA: Diagnosis not present

## 2015-08-26 DIAGNOSIS — S72141D Displaced intertrochanteric fracture of right femur, subsequent encounter for closed fracture with routine healing: Secondary | ICD-10-CM | POA: Diagnosis not present

## 2015-08-26 DIAGNOSIS — W19XXXD Unspecified fall, subsequent encounter: Secondary | ICD-10-CM | POA: Diagnosis not present

## 2015-08-26 DIAGNOSIS — Z96641 Presence of right artificial hip joint: Secondary | ICD-10-CM | POA: Diagnosis not present

## 2015-08-26 DIAGNOSIS — I10 Essential (primary) hypertension: Secondary | ICD-10-CM | POA: Diagnosis not present

## 2015-08-26 DIAGNOSIS — I499 Cardiac arrhythmia, unspecified: Secondary | ICD-10-CM | POA: Diagnosis not present

## 2015-08-27 DIAGNOSIS — I10 Essential (primary) hypertension: Secondary | ICD-10-CM | POA: Diagnosis not present

## 2015-08-27 DIAGNOSIS — Z96653 Presence of artificial knee joint, bilateral: Secondary | ICD-10-CM | POA: Diagnosis not present

## 2015-08-27 DIAGNOSIS — S72141D Displaced intertrochanteric fracture of right femur, subsequent encounter for closed fracture with routine healing: Secondary | ICD-10-CM | POA: Diagnosis not present

## 2015-08-27 DIAGNOSIS — Z96641 Presence of right artificial hip joint: Secondary | ICD-10-CM | POA: Diagnosis not present

## 2015-08-27 DIAGNOSIS — W19XXXD Unspecified fall, subsequent encounter: Secondary | ICD-10-CM | POA: Diagnosis not present

## 2015-08-27 DIAGNOSIS — I499 Cardiac arrhythmia, unspecified: Secondary | ICD-10-CM | POA: Diagnosis not present

## 2015-09-09 ENCOUNTER — Other Ambulatory Visit: Payer: Self-pay | Admitting: Orthopaedic Surgery

## 2015-09-10 ENCOUNTER — Encounter (HOSPITAL_COMMUNITY): Payer: Self-pay

## 2015-09-10 ENCOUNTER — Encounter (HOSPITAL_COMMUNITY)
Admission: RE | Admit: 2015-09-10 | Discharge: 2015-09-10 | Disposition: A | Discharge status: Home / Self Care | Payer: Commercial Managed Care - HMO | Source: Ambulatory Visit | Attending: Orthopaedic Surgery | Admitting: Orthopaedic Surgery

## 2015-09-10 DIAGNOSIS — M1711 Unilateral primary osteoarthritis, right knee: Secondary | ICD-10-CM | POA: Diagnosis not present

## 2015-09-10 DIAGNOSIS — I10 Essential (primary) hypertension: Secondary | ICD-10-CM | POA: Diagnosis present

## 2015-09-10 DIAGNOSIS — Z86718 Personal history of other venous thrombosis and embolism: Secondary | ICD-10-CM | POA: Diagnosis not present

## 2015-09-10 DIAGNOSIS — Z8781 Personal history of (healed) traumatic fracture: Secondary | ICD-10-CM | POA: Diagnosis not present

## 2015-09-10 DIAGNOSIS — M8580 Other specified disorders of bone density and structure, unspecified site: Secondary | ICD-10-CM | POA: Diagnosis present

## 2015-09-10 DIAGNOSIS — L089 Local infection of the skin and subcutaneous tissue, unspecified: Secondary | ICD-10-CM | POA: Diagnosis not present

## 2015-09-10 DIAGNOSIS — F329 Major depressive disorder, single episode, unspecified: Secondary | ICD-10-CM | POA: Diagnosis present

## 2015-09-10 DIAGNOSIS — T8451XA Infection and inflammatory reaction due to internal right hip prosthesis, initial encounter: Secondary | ICD-10-CM | POA: Diagnosis present

## 2015-09-10 DIAGNOSIS — Z471 Aftercare following joint replacement surgery: Secondary | ICD-10-CM | POA: Diagnosis not present

## 2015-09-10 DIAGNOSIS — M96842 Postprocedural seroma of a musculoskeletal structure following a musculoskeletal system procedure: Secondary | ICD-10-CM | POA: Diagnosis present

## 2015-09-10 DIAGNOSIS — Z741 Need for assistance with personal care: Secondary | ICD-10-CM | POA: Diagnosis not present

## 2015-09-10 DIAGNOSIS — D62 Acute posthemorrhagic anemia: Secondary | ICD-10-CM | POA: Diagnosis not present

## 2015-09-10 DIAGNOSIS — K219 Gastro-esophageal reflux disease without esophagitis: Secondary | ICD-10-CM | POA: Diagnosis present

## 2015-09-10 DIAGNOSIS — L03115 Cellulitis of right lower limb: Secondary | ICD-10-CM | POA: Diagnosis present

## 2015-09-10 DIAGNOSIS — Y838 Other surgical procedures as the cause of abnormal reaction of the patient, or of later complication, without mention of misadventure at the time of the procedure: Secondary | ICD-10-CM | POA: Diagnosis present

## 2015-09-10 LAB — CBC
HCT: 32.5 % — ABNORMAL LOW (ref 36.0–46.0)
HEMOGLOBIN: 10.3 g/dL — AB (ref 12.0–15.0)
MCH: 27.9 pg (ref 26.0–34.0)
MCHC: 31.7 g/dL (ref 30.0–36.0)
MCV: 88.1 fL (ref 78.0–100.0)
Platelets: 345 10*3/uL (ref 150–400)
RBC: 3.69 MIL/uL — AB (ref 3.87–5.11)
RDW: 17.5 % — ABNORMAL HIGH (ref 11.5–15.5)
WBC: 6.2 10*3/uL (ref 4.0–10.5)

## 2015-09-10 LAB — BASIC METABOLIC PANEL
Anion gap: 8 (ref 5–15)
CHLORIDE: 100 mmol/L — AB (ref 101–111)
CO2: 30 mmol/L (ref 22–32)
Calcium: 8.7 mg/dL — ABNORMAL LOW (ref 8.9–10.3)
Creatinine, Ser: 0.55 mg/dL (ref 0.44–1.00)
GFR calc Af Amer: >60 mL/min (ref >60)
GFR calc non Af Amer: >60 mL/min (ref >60)
GLUCOSE: 106 mg/dL — AB (ref 65–99)
POTASSIUM: 3.1 mmol/L — AB (ref 3.5–5.1)
Sodium: 138 mmol/L (ref 135–145)

## 2015-09-10 LAB — PROTIME-INR
INR: 1.57 — ABNORMAL HIGH (ref 0.00–1.49)
Prothrombin Time: 18.2 seconds — ABNORMAL HIGH (ref 11.6–15.2)

## 2015-09-10 LAB — SEDIMENTATION RATE: Sed Rate: 46 mm/hr — ABNORMAL HIGH (ref 0–22)

## 2015-09-10 LAB — C-REACTIVE PROTEIN: CRP: 1.5 mg/dL — ABNORMAL HIGH (ref <1.0)

## 2015-09-10 NOTE — Progress Notes (Signed)
09-10-15 - PT lab results from preop visit on 09-10-15 faxed to Dr. Ninfa Linden via [facility]

## 2015-09-10 NOTE — Patient Instructions (Signed)
Lori Bradford  09/10/2015   Your procedure is scheduled on: September 11, 2015  Report to [facility] Main  Entrance take [facility]  elevators to 3rd floor to  [place] at  3:00 PM.  Call this number if you have problems the morning of surgery [phone]   Remember: ONLY 1 PERSON MAY GO WITH YOU TO SHORT STAY TO GET  READY MORNING OF [place].  Do not eat food after midnight.  Follow clear liquid diet after midnight until 10:30 AM.  Then nothing by mouth.      Take these medicines the morning of surgery with A SIP OF WATER: Prilosec, Percocet if needed DO NOT TAKE ANY DIABETIC MEDICATIONS DAY OF YOUR SURGERY                               You may not have any metal on your body including hair pins and              piercings  Do not wear jewelry, make-up, lotions, powders or perfumes, deodorant             Do not wear nail polish.  Do not shave  48 hours prior to surgery.     Do not bring valuables to the hospital. [place].  Contacts, dentures or bridgework may not be worn into surgery.  Leave suitcase in the car. After surgery it may be brought to your room.        Special Instructions:  Coughing and deep breathing exercises, leg exercises              Please read over the following fact sheets you were given: _____________________________________________________________________             [facility] - Preparing for Surgery Before surgery, you can play an important role.  Because skin is not sterile, your skin needs to be as free of germs as possible.  You can reduce the number of germs on your skin by washing with CHG (chlorahexidine gluconate) soap before surgery.  CHG is an antiseptic cleaner which kills germs and bonds with the skin to continue killing germs even after washing. Please DO NOT use if you have an allergy to CHG or antibacterial soaps.  If your skin becomes reddened/irritated stop  using the CHG and inform your nurse when you arrive at Short Stay. Do not shave (including legs and underarms) for at least 48 hours prior to the first CHG shower.  You may shave your face/neck. Please follow these instructions carefully:  1.  Shower with CHG Soap the night before surgery and the  morning of Surgery.  2.  If you choose to wash your hair, wash your hair first as usual with your  normal  shampoo.  3.  After you shampoo, rinse your hair and body thoroughly to remove the  shampoo.                           4.  Use CHG as you would any other liquid soap.  You can apply chg directly  to the skin and wash  Gently with a scrungie or clean washcloth.  5.  Apply the CHG Soap to your body ONLY FROM THE NECK DOWN.   Do not use on face/ open                           Wound or open sores. Avoid contact with eyes, ears mouth and genitals (private parts).                       Wash face,  Genitals (private parts) with your normal soap.             6.  Wash thoroughly, paying special attention to the area where your surgery  will be performed.  7.  Thoroughly rinse your body with warm water from the neck down.  8.  DO NOT shower/wash with your normal soap after using and rinsing off  the CHG Soap.                9.  Pat yourself dry with a clean towel.            10.  Wear clean pajamas.            11.  Place clean sheets on your bed the night of your first shower and do not  sleep with pets. Day of Surgery : Do not apply any lotions/deodorants the morning of surgery.  Please wear clean clothes to the hospital/surgery center.  FAILURE TO FOLLOW THESE INSTRUCTIONS MAY RESULT IN THE CANCELLATION OF YOUR SURGERY PATIENT SIGNATURE_________________________________  NURSE SIGNATURE__________________________________  ________________________________________________________________________    CLEAR LIQUID DIET   Foods Allowed                                                                      Foods Excluded  Coffee and tea, regular and decaf                             liquids that you cannot  Plain Jell-O in any flavor                                             see through such as: Fruit ices (not with fruit pulp)                                     milk, soups, orange juice  Iced Popsicles                                    All solid food Carbonated beverages, regular and diet                                    Cranberry, grape and apple juices Sports drinks like Gatorade Lightly seasoned clear broth or consume(fat free) Sugar, honey syrup  Sample Menu Breakfast                                Lunch                                     Supper Cranberry juice                    Beef broth                            Chicken broth Jell-O                                     Grape juice                           Apple juice Coffee or tea                        Jell-O                                      Popsicle                                                Coffee or tea                        Coffee or tea  _____________________________________________________________________

## 2015-09-10 NOTE — Progress Notes (Signed)
08-21-15 - LOV - Dr. Marijean Bravo (int.med) - EPIC 08-19-15 - LOV - Dr. Denton Brick (int.med.) - EPIC 08-19-15 - Vas. U/S Bilateral Lower Extremity - EPIC 07-31-15 - EKG - EPIC 07-21-15 - LOV - R. Ysidro Evert, PA, (sports medicine) - Care Everywhere

## 2015-09-11 ENCOUNTER — Encounter (HOSPITAL_COMMUNITY): Payer: Self-pay | Admitting: Anesthesiology

## 2015-09-11 ENCOUNTER — Inpatient Hospital Stay (HOSPITAL_COMMUNITY)
Admission: RE | Admit: 2015-09-11 | Discharge: 2015-09-14 | DRG: 464 | Disposition: A | Discharge status: Home / Self Care | Payer: Commercial Managed Care - HMO | Source: Ambulatory Visit | Attending: Orthopaedic Surgery | Admitting: Orthopaedic Surgery

## 2015-09-11 ENCOUNTER — Inpatient Hospital Stay (HOSPITAL_COMMUNITY): Payer: Commercial Managed Care - HMO | Admitting: Anesthesiology

## 2015-09-11 ENCOUNTER — Encounter (HOSPITAL_COMMUNITY)
Admission: RE | Disposition: A | Discharge status: Home / Self Care | Payer: Self-pay | Source: Ambulatory Visit | Attending: Orthopaedic Surgery

## 2015-09-11 DIAGNOSIS — M8580 Other specified disorders of bone density and structure, unspecified site: Secondary | ICD-10-CM | POA: Diagnosis present

## 2015-09-11 DIAGNOSIS — Z86718 Personal history of other venous thrombosis and embolism: Secondary | ICD-10-CM | POA: Diagnosis not present

## 2015-09-11 DIAGNOSIS — T8451XA Infection and inflammatory reaction due to internal right hip prosthesis, initial encounter: Secondary | ICD-10-CM

## 2015-09-11 DIAGNOSIS — I10 Essential (primary) hypertension: Secondary | ICD-10-CM | POA: Diagnosis present

## 2015-09-11 DIAGNOSIS — D62 Acute posthemorrhagic anemia: Secondary | ICD-10-CM | POA: Diagnosis not present

## 2015-09-11 DIAGNOSIS — Y838 Other surgical procedures as the cause of abnormal reaction of the patient, or of later complication, without mention of misadventure at the time of the procedure: Secondary | ICD-10-CM | POA: Diagnosis present

## 2015-09-11 DIAGNOSIS — M96842 Postprocedural seroma of a musculoskeletal structure following a musculoskeletal system procedure: Secondary | ICD-10-CM | POA: Diagnosis present

## 2015-09-11 DIAGNOSIS — K219 Gastro-esophageal reflux disease without esophagitis: Secondary | ICD-10-CM | POA: Diagnosis present

## 2015-09-11 DIAGNOSIS — L03115 Cellulitis of right lower limb: Secondary | ICD-10-CM | POA: Diagnosis present

## 2015-09-11 DIAGNOSIS — Z8781 Personal history of (healed) traumatic fracture: Secondary | ICD-10-CM

## 2015-09-11 DIAGNOSIS — F329 Major depressive disorder, single episode, unspecified: Secondary | ICD-10-CM | POA: Diagnosis present

## 2015-09-11 DIAGNOSIS — D649 Anemia, unspecified: Secondary | ICD-10-CM

## 2015-09-11 DIAGNOSIS — T8149XA Infection following a procedure, other surgical site, initial encounter: Secondary | ICD-10-CM | POA: Diagnosis present

## 2015-09-11 HISTORY — PX: INCISION AND DRAINAGE HIP: SHX1801

## 2015-09-11 LAB — PROTIME-INR
INR: 1.32 (ref 0.00–1.49)
PROTHROMBIN TIME: 16 s — AB (ref 11.6–15.2)

## 2015-09-11 SURGERY — IRRIGATION AND DEBRIDEMENT HIP WITH POLY EXCHANGE
Anesthesia: General | Site: Hip | Laterality: Right

## 2015-09-11 MED ORDER — DIPHENHYDRAMINE HCL 50 MG/ML IJ SOLN
INTRAMUSCULAR | Status: DC | PRN
  Administered 2015-09-11 (×2): 12.5 mg via INTRAVENOUS

## 2015-09-11 MED ORDER — ONDANSETRON HCL 4 MG/2ML IJ SOLN
INTRAMUSCULAR | Status: AC
  Filled 2015-09-11: qty 2

## 2015-09-11 MED ORDER — SUGAMMADEX SODIUM 200 MG/2ML IV SOLN
INTRAVENOUS | Status: DC | PRN
  Administered 2015-09-11: 200 mg via INTRAVENOUS

## 2015-09-11 MED ORDER — DOCUSATE SODIUM 100 MG PO CAPS
100.0000 mg | ORAL_CAPSULE | Freq: Two times a day (BID) | ORAL | Status: DC
  Administered 2015-09-11 – 2015-09-14 (×5): 100 mg via ORAL

## 2015-09-11 MED ORDER — PHENYLEPHRINE 40 MCG/ML (10ML) SYRINGE FOR IV PUSH (FOR BLOOD PRESSURE SUPPORT)
PREFILLED_SYRINGE | INTRAVENOUS | Status: AC
  Filled 2015-09-11: qty 10

## 2015-09-11 MED ORDER — VANCOMYCIN HCL 1000 MG IV SOLR
1000.0000 mg | INTRAVENOUS | Status: DC | PRN
  Administered 2015-09-11: 1000 mg via INTRAVENOUS

## 2015-09-11 MED ORDER — ACETAMINOPHEN 650 MG RE SUPP: 650.0000 mg | Freq: Four times a day (QID) | RECTAL | Status: DC | PRN

## 2015-09-11 MED ORDER — HYDROMORPHONE HCL 1 MG/ML IJ SOLN: 0.5000 mg | INTRAMUSCULAR | Status: DC | PRN

## 2015-09-11 MED ORDER — METHOCARBAMOL 500 MG PO TABS
500.0000 mg | ORAL_TABLET | Freq: Four times a day (QID) | ORAL | Status: DC | PRN
  Administered 2015-09-11 – 2015-09-13 (×3): 500 mg via ORAL
  Filled 2015-09-11 (×3): qty 1

## 2015-09-11 MED ORDER — DEXAMETHASONE SODIUM PHOSPHATE 10 MG/ML IJ SOLN
INTRAMUSCULAR | Status: AC
  Filled 2015-09-11: qty 1

## 2015-09-11 MED ORDER — ONDANSETRON HCL 4 MG/2ML IJ SOLN: 4.0000 mg | Freq: Four times a day (QID) | INTRAMUSCULAR | Status: DC | PRN

## 2015-09-11 MED ORDER — ALPRAZOLAM 1 MG PO TABS
1.0000 mg | ORAL_TABLET | Freq: Every day | ORAL | Status: DC
  Administered 2015-09-11 – 2015-09-13 (×3): 1 mg via ORAL
  Filled 2015-09-11 (×3): qty 1

## 2015-09-11 MED ORDER — IBUPROFEN 200 MG PO TABS: 600.0000 mg | ORAL_TABLET | Freq: Three times a day (TID) | ORAL | Status: DC | PRN

## 2015-09-11 MED ORDER — DEXTROSE 5 % IV SOLN
500.0000 mg | Freq: Four times a day (QID) | INTRAVENOUS | Status: DC | PRN
  Filled 2015-09-11: qty 5

## 2015-09-11 MED ORDER — PROPOFOL 10 MG/ML IV BOLUS
INTRAVENOUS | Status: DC | PRN
  Administered 2015-09-11: 140 mg via INTRAVENOUS

## 2015-09-11 MED ORDER — SUGAMMADEX SODIUM 200 MG/2ML IV SOLN
INTRAVENOUS | Status: AC
Start: 2015-09-11 — End: 2015-09-11
  Filled 2015-09-11: qty 2

## 2015-09-11 MED ORDER — METOCLOPRAMIDE HCL 10 MG PO TABS: 5.0000 mg | ORAL_TABLET | Freq: Three times a day (TID) | ORAL | Status: DC | PRN

## 2015-09-11 MED ORDER — OXYCODONE HCL 5 MG PO TABS
5.0000 mg | ORAL_TABLET | ORAL | Status: DC | PRN
  Administered 2015-09-11 – 2015-09-13 (×3): 5 mg via ORAL
  Filled 2015-09-11 (×4): qty 1

## 2015-09-11 MED ORDER — MIDAZOLAM HCL 5 MG/5ML IJ SOLN
INTRAMUSCULAR | Status: DC | PRN
  Administered 2015-09-11: 1 mg via INTRAVENOUS

## 2015-09-11 MED ORDER — MIDAZOLAM HCL 2 MG/2ML IJ SOLN
INTRAMUSCULAR | Status: AC
  Filled 2015-09-11: qty 2

## 2015-09-11 MED ORDER — PROPOFOL 10 MG/ML IV BOLUS
INTRAVENOUS | Status: AC
  Filled 2015-09-11: qty 20

## 2015-09-11 MED ORDER — LACTATED RINGERS IV SOLN
INTRAVENOUS | Status: DC | PRN
  Administered 2015-09-11 (×2): via INTRAVENOUS

## 2015-09-11 MED ORDER — METOCLOPRAMIDE HCL 5 MG/ML IJ SOLN
INTRAMUSCULAR | Status: DC | PRN
  Administered 2015-09-11: 10 mg via INTRAVENOUS

## 2015-09-11 MED ORDER — FENTANYL CITRATE (PF) 250 MCG/5ML IJ SOLN
INTRAMUSCULAR | Status: AC
  Filled 2015-09-11: qty 5

## 2015-09-11 MED ORDER — DEXAMETHASONE SODIUM PHOSPHATE 4 MG/ML IJ SOLN
INTRAMUSCULAR | Status: DC | PRN
  Administered 2015-09-11: 10 mg via INTRAVENOUS

## 2015-09-11 MED ORDER — ONDANSETRON HCL 4 MG PO TABS: 4.0000 mg | ORAL_TABLET | Freq: Four times a day (QID) | ORAL | Status: DC | PRN

## 2015-09-11 MED ORDER — VANCOMYCIN HCL 10 G IV SOLR
1250.0000 mg | Freq: Two times a day (BID) | INTRAVENOUS | Status: DC
  Administered 2015-09-12 – 2015-09-13 (×4): 1250 mg via INTRAVENOUS
  Filled 2015-09-11 (×5): qty 1250

## 2015-09-11 MED ORDER — CALCIUM CARBONATE-VITAMIN D 500-200 MG-UNIT PO TABS
2.0000 | ORAL_TABLET | Freq: Every day | ORAL | Status: DC
  Administered 2015-09-12 – 2015-09-14 (×3): 2 via ORAL
  Filled 2015-09-11 (×4): qty 2

## 2015-09-11 MED ORDER — ACETAMINOPHEN 325 MG PO TABS: 650.0000 mg | ORAL_TABLET | Freq: Four times a day (QID) | ORAL | Status: DC | PRN

## 2015-09-11 MED ORDER — ONDANSETRON HCL 4 MG/2ML IJ SOLN: 4.0000 mg | Freq: Once | INTRAMUSCULAR | Status: DC | PRN

## 2015-09-11 MED ORDER — HYDROMORPHONE HCL 1 MG/ML IJ SOLN: 1.0000 mg | INTRAMUSCULAR | Status: DC | PRN

## 2015-09-11 MED ORDER — FENTANYL CITRATE (PF) 100 MCG/2ML IJ SOLN
INTRAMUSCULAR | Status: DC | PRN
  Administered 2015-09-11 (×2): 25 ug via INTRAVENOUS
  Administered 2015-09-11 (×3): 50 ug via INTRAVENOUS

## 2015-09-11 MED ORDER — SODIUM CHLORIDE 0.9 % IV SOLN
INTRAVENOUS | Status: DC
  Administered 2015-09-11: 22:00:00 via INTRAVENOUS

## 2015-09-11 MED ORDER — DIPHENHYDRAMINE HCL 12.5 MG/5ML PO ELIX: 12.5000 mg | ORAL_SOLUTION | ORAL | Status: DC | PRN

## 2015-09-11 MED ORDER — SODIUM CHLORIDE 0.9 % IR SOLN
Status: DC | PRN
  Administered 2015-09-11: 1000 mL
  Administered 2015-09-11 (×2): 3000 mL

## 2015-09-11 MED ORDER — ONDANSETRON HCL 4 MG/2ML IJ SOLN
INTRAMUSCULAR | Status: DC | PRN
  Administered 2015-09-11: 4 mg via INTRAVENOUS

## 2015-09-11 MED ORDER — METOCLOPRAMIDE HCL 5 MG/ML IJ SOLN: 5.0000 mg | Freq: Three times a day (TID) | INTRAMUSCULAR | Status: DC | PRN

## 2015-09-11 MED ORDER — ROCURONIUM BROMIDE 100 MG/10ML IV SOLN
INTRAVENOUS | Status: AC
  Filled 2015-09-11: qty 1

## 2015-09-11 MED ORDER — ROCURONIUM BROMIDE 100 MG/10ML IV SOLN
INTRAVENOUS | Status: DC | PRN
  Administered 2015-09-11: 40 mg via INTRAVENOUS

## 2015-09-11 MED ORDER — METOCLOPRAMIDE HCL 5 MG/ML IJ SOLN
INTRAMUSCULAR | Status: AC
  Filled 2015-09-11: qty 2

## 2015-09-11 MED ORDER — VANCOMYCIN HCL IN DEXTROSE 1-5 GM/200ML-% IV SOLN
INTRAVENOUS | Status: AC
  Filled 2015-09-11: qty 200

## 2015-09-11 MED ORDER — FERROUS SULFATE 325 (65 FE) MG PO TABS
325.0000 mg | ORAL_TABLET | Freq: Every day | ORAL | Status: DC
  Administered 2015-09-12 – 2015-09-14 (×3): 325 mg via ORAL
  Filled 2015-09-11 (×3): qty 1

## 2015-09-11 SURGICAL SUPPLY — 49 items
BAG SPEC THK2 15X12 ZIP CLS (MISCELLANEOUS) ×1
BAG ZIPLOCK 12X15 (MISCELLANEOUS) ×3 IMPLANT
CELLS DAT CNTRL 66122 CELL SVR (MISCELLANEOUS) ×1 IMPLANT
DRAPE INCISE IOBAN 66X45 STRL (DRAPES) ×2 IMPLANT
DRAPE ORTHO SPLIT 77X108 STRL (DRAPES) ×6
DRAPE SURG ORHT 6 SPLT 77X108 (DRAPES) ×2 IMPLANT
DRAPE U-SHAPE 47X51 STRL (DRAPES) ×1 IMPLANT
DRSG AQUACEL AG ADV 3.5X 6 (GAUZE/BANDAGES/DRESSINGS) ×2 IMPLANT
DRSG AQUACEL AG ADV 3.5X10 (GAUZE/BANDAGES/DRESSINGS) ×2 IMPLANT
DRSG PAD ABDOMINAL 8X10 ST (GAUZE/BANDAGES/DRESSINGS) ×2 IMPLANT
DURAPREP 26ML APPLICATOR (WOUND CARE) ×3 IMPLANT
ELECT REM PT RETURN 9FT ADLT (ELECTROSURGICAL) ×3
ELECTRODE REM PT RTRN 9FT ADLT (ELECTROSURGICAL) ×1 IMPLANT
EVACUATOR 1/8 PVC DRAIN (DRAIN) ×3 IMPLANT
GAUZE SPONGE 4X4 12PLY STRL (GAUZE/BANDAGES/DRESSINGS) ×1 IMPLANT
GAUZE XEROFORM 1X8 LF (GAUZE/BANDAGES/DRESSINGS) ×4 IMPLANT
GLOVE BIOGEL PI IND STRL 8 (GLOVE) ×1 IMPLANT
GLOVE BIOGEL PI INDICATOR 8 (GLOVE) ×2
GLOVE ECLIPSE 8.0 STRL XLNG CF (GLOVE) ×3 IMPLANT
GLOVE ORTHO TXT STRL SZ7.5 (GLOVE) ×3 IMPLANT
GOWN STRL REUS W/TWL XL LVL3 (GOWN DISPOSABLE) ×3 IMPLANT
HANDPIECE INTERPULSE COAX TIP (DISPOSABLE) ×6
IV NS IRRIG 3000ML ARTHROMATIC (IV SOLUTION) ×2 IMPLANT
KIT BASIN OR (CUSTOM PROCEDURE TRAY) ×1 IMPLANT
KIT PREP HIP W/CEMENT RESTRICT (KITS) IMPLANT
KIT PREPARATION TOTAL HIP (KITS) ×3
NS IRRIG 1000ML POUR BTL (IV SOLUTION) ×3 IMPLANT
PACK TOTAL JOINT (CUSTOM PROCEDURE TRAY) ×1 IMPLANT
POSITIONER SURGICAL ARM (MISCELLANEOUS) ×3 IMPLANT
RTRCTR WOUND ALEXIS 18CM MED (MISCELLANEOUS) ×3
SET HNDPC FAN SPRY TIP SCT (DISPOSABLE) ×1 IMPLANT
SOL PREP POV-IOD 4OZ 10% (MISCELLANEOUS) ×3 IMPLANT
SOL PREP PROV IODINE SCRUB 4OZ (MISCELLANEOUS) ×3 IMPLANT
SPONGE LAP 18X18 X RAY DECT (DISPOSABLE) ×4 IMPLANT
STAPLER VISISTAT 35W (STAPLE) ×5 IMPLANT
STOCKINETTE 8 INCH (MISCELLANEOUS) ×1 IMPLANT
SUT ETHIBOND CT1 BRD #0 30IN (SUTURE) ×2 IMPLANT
SUT ETHILON 2 0 PS N (SUTURE) ×1 IMPLANT
SUT ETHILON 3 0 PS 1 (SUTURE) ×1 IMPLANT
SUT VIC AB 0 CT1 27 (SUTURE) ×3
SUT VIC AB 0 CT1 27XBRD ANTBC (SUTURE) IMPLANT
SUT VIC AB 1 CT1 27 (SUTURE) ×6
SUT VIC AB 1 CT1 27XBRD ANTBC (SUTURE) IMPLANT
SUT VIC AB 2-0 SH 27 (SUTURE) ×6
SUT VIC AB 2-0 SH 27X BRD (SUTURE) ×2 IMPLANT
SWAB COLLECTION DEVICE MRSA (MISCELLANEOUS) IMPLANT
SWAB CULTURE ESWAB REG 1ML (MISCELLANEOUS) ×3 IMPLANT
TOWEL OR 17X26 10 PK STRL BLUE (TOWEL DISPOSABLE) ×3 IMPLANT
WATER STERILE IRR 1500ML POUR (IV SOLUTION) ×3 IMPLANT

## 2015-09-11 NOTE — Transfer of Care (Signed)
Immediate Anesthesia Transfer of Care Note  Patient: Lori Bradford  Procedure(s) Performed: Procedure(s): IRRIGATION AND DEBRIDEMENT RIGHT HIP (Right)  Patient Location: PACU  Anesthesia Type:General  Level of Consciousness: Patient easily awoken, sedated, comfortable, cooperative, following commands, responds to stimulation.   Airway & Oxygen Therapy: Patient spontaneously breathing, ventilating well, oxygen via simple oxygen mask.  Post-op Assessment: Report given to PACU RN, vital signs reviewed and stable, moving all extremities.   Post vital signs: Reviewed and stable.  Complications: No apparent anesthesia complications

## 2015-09-11 NOTE — Anesthesia Procedure Notes (Signed)
Procedure Name: Intubation Date/Time: 09/11/2015 4:44 PM Performed by: Deliah Boston Pre-anesthesia Checklist: Patient identified, Emergency Drugs available, Suction available and Patient being monitored Patient Re-evaluated:Patient Re-evaluated prior to inductionOxygen Delivery Method: Circle System Utilized Preoxygenation: Pre-oxygenation with 100% oxygen Intubation Type: IV induction Ventilation: Mask ventilation without difficulty Laryngoscope Size: Mac and 3 Grade View: Grade I Tube type: Oral Tube size: 7.0 mm Number of attempts: 1 Airway Equipment and Method: Oral airway Placement Confirmation: ETT inserted through vocal cords under direct vision,  positive ETCO2 and breath sounds checked- equal and bilateral Secured at: 21 cm Tube secured with: Tape Dental Injury: Teeth and Oropharynx as per pre-operative assessment

## 2015-09-11 NOTE — Progress Notes (Signed)
Pharmacy Antibiotic Note  Lori Bradford is a 70 y.o. female admitted on 09/11/2015 with wound infection.  Pharmacy has been consulted for vancomycin dosing.  Plan:  Pt. Received vanc 1gm IV x 1 in OR, starting ~8 hours from then will start vanc 1250mg  IV q12h  Follow renal function, clinical course  Check vanc trough at steady state as needed      Temp (24hrs), Avg:98.1 F (36.7 C), Min:97.6 F (36.4 C), Max:98.6 F (37 C)   Recent Labs Lab 09/10/15 1430  WBC 6.2  CREATININE 0.55    Estimated Creatinine Clearance: 72 mL/min (by C-G formula based on Cr of 0.55).     Antimicrobials this admission: 4/7 vanc Dose adjustments this admission:   Microbiology results: 4/7 wound culture:   Thank you for allowing pharmacy to be a part of this patient's care.  Dolly Rias RPh 09/11/2015, 7:57 PM Pager [phone]

## 2015-09-11 NOTE — Anesthesia Preprocedure Evaluation (Addendum)
Anesthesia Evaluation  Patient identified by MRN, date of birth, ID band Patient awake    Reviewed: Allergy & Precautions, NPO status , Patient's Chart, lab work & pertinent test results  History of Anesthesia Complications (+) PONV  Airway Mallampati: I       Dental   Pulmonary pneumonia,    Pulmonary exam normal        Cardiovascular hypertension, + Peripheral Vascular Disease  Normal cardiovascular exam+ dysrhythmias      Neuro/Psych Depression    GI/Hepatic hiatal hernia, GERD  ,  Endo/Other    Renal/GU Renal disease     Musculoskeletal  (+) Arthritis ,   Abdominal   Peds  Hematology  (+) anemia ,   Anesthesia Other Findings   Reproductive/Obstetrics                            Anesthesia Physical Anesthesia Plan  ASA: II  Anesthesia Plan: General   Post-op Pain Management:    Induction: Intravenous  Airway Management Planned: Oral ETT  Additional Equipment:   Intra-op Plan:   Post-operative Plan: Extubation in OR  Informed Consent: I have reviewed the patients History and Physical, chart, labs and discussed the procedure including the risks, benefits and alternatives for the proposed anesthesia with the patient or authorized representative who has indicated his/her understanding and acceptance.     Plan Discussed with: CRNA, Anesthesiologist and Surgeon  Anesthesia Plan Comments:         Anesthesia Quick Evaluation
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
## 2015-09-11 NOTE — Brief Op Note (Signed)
09/11/2015  6:01 PM  PATIENT:  Lori Bradford  70 y.o. female  PRE-OPERATIVE DIAGNOSIS:  right hip infection  POST-OPERATIVE DIAGNOSIS:  right hip infection  PROCEDURE:  Procedure(s): IRRIGATION AND DEBRIDEMENT RIGHT HIP (Right)  SURGEON:  Surgeon(s) and Role:    * Mcarthur Rossetti, MD - Primary  PHYSICIAN ASSISTANT: Benita Stabile, PA-C  ANESTHESIA:   general  EBL:  Total I/O In: 250 [IV Piggyback:250] Out: 200 [Blood:200]   COUNTS:  YES  TOURNIQUET:  * No tourniquets in log *  DICTATION: .Other Dictation: Dictation Number [id number]  PLAN OF CARE: Admit to inpatient   PATIENT DISPOSITION:  PACU - hemodynamically stable.   Delay start of Pharmacological VTE agent (>24hrs) due to surgical blood loss or risk of bleeding: no

## 2015-09-11 NOTE — Anesthesia Postprocedure Evaluation (Signed)
Anesthesia Post Note  Patient: Lori Bradford  Procedure(s) Performed: Procedure(s) (LRB): IRRIGATION AND DEBRIDEMENT RIGHT HIP (Right)  Patient location during evaluation: PACU Anesthesia Type: General Level of consciousness: awake, oriented, sedated and patient cooperative Pain management: pain level controlled Vital Signs Assessment: post-procedure vital signs reviewed and stable Respiratory status: spontaneous breathing and respiratory function stable Cardiovascular status: stable and blood pressure returned to baseline Anesthetic complications: no    Last Vitals:  Filed Vitals:   09/11/15 1523 09/11/15 1819  BP: 117/59 116/69  Pulse: 94 88  Temp: 36.7 C 36.4 C  Resp: 20 14    Last Pain: There were no vitals filed for this visit.               Gearold Wainer EDWARD

## 2015-09-12 LAB — CBC
HCT: 26.7 % — ABNORMAL LOW (ref 36.0–46.0)
HEMOGLOBIN: 8.6 g/dL — AB (ref 12.0–15.0)
MCH: 27.7 pg (ref 26.0–34.0)
MCHC: 32.2 g/dL (ref 30.0–36.0)
MCV: 86.1 fL (ref 78.0–100.0)
Platelets: 319 10*3/uL (ref 150–400)
RBC: 3.1 MIL/uL — ABNORMAL LOW (ref 3.87–5.11)
RDW: 17.3 % — AB (ref 11.5–15.5)
WBC: 5.1 10*3/uL (ref 4.0–10.5)

## 2015-09-12 MED ORDER — RIVAROXABAN 20 MG PO TABS
20.0000 mg | ORAL_TABLET | Freq: Every day | ORAL | Status: DC
  Administered 2015-09-12: 20 mg via ORAL
  Filled 2015-09-12 (×3): qty 1
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
## 2015-09-12 NOTE — Progress Notes (Signed)
Brief Pharmacy note: Hx of LLE DVT diagnosed 08/1515 at [facility] s/p R THA 08/04/15. Prescribed Xarelto 15mg  bid x 21 days, followed by 20mg  daily to begin 4/6.  Last dose Xarelto noted on 4/5  Admit 4/7 for I&D of R hip wound, abx.   Home meds not listed on Ortho progress note, no mention of DVT, Xarelto  Spoke with Dr. Erlinda Hong, per Dr. Ninfa Linden may resume Xarelto 20mg  daily.    Resumed Xarelto 20mg  daily with supper on 4/8  Thank you,  Minda Ditto PharmD Pager [phone] 09/12/2015, 3:00 PM

## 2015-09-12 NOTE — Progress Notes (Signed)
PT Cancellation Note  Patient Details Name: Lori Bradford MRN: [id number] DOB: 1945/08/27   Cancelled Treatment:     PT order received but eval deferred at request of pt.  Pt very pleasant and states she is moving well and experiencing min pain "You're no fun anymore, I don't need you".  Nursing confirms pt has been OOB without difficulty to use commode.  Will follow up in am and proceed with eval if pt needs warrant PT intervention.   Delisha Peaden 09/12/2015, 3:41 PM

## 2015-09-12 NOTE — Progress Notes (Signed)
   Subjective:  Patient reports pain as mild.  Wants to go home today.  Objective:   VITALS:   Filed Vitals:   09/11/15 2143 09/11/15 2300 09/12/15 0205 09/12/15 0637  BP: 135/61 133/53 107/49 118/52  Pulse: 98 84 66 68  Temp: 98 F (36.7 C) 98.6 F (37 C) 98 F (36.7 C) 97.8 F (36.6 C)  TempSrc: Oral Oral Oral Oral  Resp: 16 16 16 16   Height:      Weight:      SpO2: 97% 98% 97% 94%    Neurologically intact Neurovascular intact Sensation intact distally Intact pulses distally Dorsiflexion/Plantar flexion intact Incision: dressing C/D/I and no drainage No cellulitis present Compartment soft   Lab Results  Component Value Date   WBC 5.1 09/12/2015   HGB 8.6* 09/12/2015   HCT 26.7* 09/12/2015   MCV 86.1 09/12/2015   PLT 319 09/12/2015     Assessment/Plan:  1 Day Post-Op   - Expected postop acute blood loss anemia - will monitor for symptoms - Up with PT/OT - did well with PT - DVT ppx - SCDs, ambulation - WBAT operative extremity - Pain control - cultures pending - continue abx  Marianna Payment 09/12/2015, 10:05 AM [phone]

## 2015-09-13 NOTE — Progress Notes (Signed)
   Subjective:  Patient reports pain as mild.  Eager to go home.  Objective:   VITALS:   Filed Vitals:   09/12/15 1000 09/12/15 1400 09/12/15 2159 09/13/15 0606  BP: 120/49 128/52 146/57 123/63  Pulse: 87 84 80 76  Temp: 98.6 F (37 C) 99 F (37.2 C) 97.9 F (36.6 C) 97.8 F (36.6 C)  TempSrc: Oral Oral Oral Oral  Resp: 17 16 16 14   Height:      Weight:      SpO2: 98% 97% 95% 96%    Neurologically intact Neurovascular intact Sensation intact distally Intact pulses distally Dorsiflexion/Plantar flexion intact Incision: scant drainage No cellulitis present Compartment soft   Lab Results  Component Value Date   WBC 5.1 09/12/2015   HGB 8.6* 09/12/2015   HCT 26.7* 09/12/2015   MCV 86.1 09/12/2015   PLT 319 09/12/2015     Assessment/Plan:  2 Days Post-Op   - Expected postop acute blood loss anemia - will monitor for symptoms - Up with PT/OT - DVT ppx - SCDs, ambulation, xarelto - WBAT operative extremity - Pain control - home tomorrow possibly pending cultures - dressings changed, incisions have scant drainage - compression dressings  Lori Bradford 09/13/2015, 7:13 AM [phone]

## 2015-09-13 NOTE — Progress Notes (Signed)
Utilization review completed.  

## 2015-09-13 NOTE — Progress Notes (Signed)
Pharmacy Antibiotic Note  Lori Bradford is a 70 y.o. female admitted on 09/11/2015 with right hip infection s/p removal of hardware and conversion to THA, I/D on 4/7.  Marland Kitchen  Pharmacy has been consulted for vancomycin dosing. Noted plan for 4-6 weeks of IV abx.   No new labs today   Plan: Continue Vanc 1250mg  IV q12h Check VT tonight with BMET  Height: 5' 2.5" (158.8 cm) Weight: 209 lb (94.802 kg) IBW/kg (Calculated) : 51.25  Temp (24hrs), Avg:98.2 F (36.8 C), Min:97.8 F (36.6 C), Max:99 F (37.2 C)   Recent Labs Lab 09/10/15 1430 09/12/15 0733  WBC 6.2 5.1  CREATININE 0.55  --     Estimated Creatinine Clearance: 72 mL/min (by C-G formula based on Cr of 0.55).     Antimicrobials this admission: 4/7 vanc >>  Dose adjustments this admission:   Microbiology results: 4/7 wound culture: NGTD  Thank you for allowing pharmacy to be a part of this patient's care.  Ralene Bathe, PharmD, BCPS 09/13/2015, 1:59 PM  Pager: [phone]

## 2015-09-13 NOTE — Progress Notes (Signed)
PT Note  Patient Details Name: Lori Bradford MRN: [id number] DOB: [birth year]   Cancelled Treatment:     Pt declines PT services stating IND with mobility.  Verified by nursing.  Will dc PT services at this time.  Please re-order if pt status declines.   Nayib Remer 09/13/2015, 10:54 AM

## 2015-09-14 ENCOUNTER — Encounter (HOSPITAL_COMMUNITY): Payer: Self-pay | Admitting: Orthopaedic Surgery

## 2015-09-14 LAB — CBC
HEMATOCRIT: 25.2 % — AB (ref 36.0–46.0)
Hemoglobin: 8 g/dL — ABNORMAL LOW (ref 12.0–15.0)
MCH: 28.3 pg (ref 26.0–34.0)
MCHC: 31.7 g/dL (ref 30.0–36.0)
MCV: 89 fL (ref 78.0–100.0)
PLATELETS: 272 10*3/uL (ref 150–400)
RBC: 2.83 MIL/uL — ABNORMAL LOW (ref 3.87–5.11)
RDW: 17.8 % — AB (ref 11.5–15.5)
WBC: 11.5 10*3/uL — ABNORMAL HIGH (ref 4.0–10.5)

## 2015-09-14 LAB — BASIC METABOLIC PANEL
Anion gap: 6 (ref 5–15)
BUN: 10 mg/dL (ref 6–20)
CALCIUM: 8.3 mg/dL — AB (ref 8.9–10.3)
CO2: 30 mmol/L (ref 22–32)
CREATININE: 1.03 mg/dL — AB (ref 0.44–1.00)
Chloride: 105 mmol/L (ref 101–111)
GFR calc Af Amer: >60 mL/min (ref >60)
GFR, EST NON AFRICAN AMERICAN: 54 mL/min — AB (ref >60)
GLUCOSE: 141 mg/dL — AB (ref 65–99)
Potassium: 2.9 mmol/L — ABNORMAL LOW (ref 3.5–5.1)
SODIUM: 141 mmol/L (ref 135–145)

## 2015-09-14 LAB — WOUND CULTURE: Culture: NO GROWTH

## 2015-09-14 LAB — VANCOMYCIN, TROUGH: VANCOMYCIN TR: 38 ug/mL — AB (ref 10.0–20.0)

## 2015-09-14 MED ORDER — DOXYCYCLINE HYCLATE 100 MG PO TABS: 100.0000 mg | ORAL_TABLET | Freq: Two times a day (BID) | ORAL | Status: DC

## 2015-09-14 MED ORDER — ASPIRIN EC 325 MG PO TBEC
325.0000 mg | DELAYED_RELEASE_TABLET | Freq: Every day | ORAL | Status: DC
  Administered 2015-09-14: 325 mg via ORAL
  Filled 2015-09-14: qty 1

## 2015-09-14 MED ORDER — TRAMADOL HCL 50 MG PO TABS: 50.0000 mg | ORAL_TABLET | Freq: Four times a day (QID) | ORAL | Status: DC | PRN

## 2015-09-14 MED ORDER — ASPIRIN 325 MG PO TBEC
325.0000 mg | DELAYED_RELEASE_TABLET | Freq: Every day | ORAL | Status: DC
Start: 2015-09-14 — End: 2019-11-21

## 2015-09-14 MED ORDER — FERROUS SULFATE 325 (65 FE) MG PO TABS: 325.0000 mg | ORAL_TABLET | Freq: Three times a day (TID) | ORAL | Status: DC

## 2015-09-14 NOTE — Progress Notes (Signed)
Patient ID: Lori Bradford, female   DOB: 02-25-1946, 70 y.o.   MRN: [id number] Cultures are all negative after 3 days.  Looks like a post-op seroma from high dose of Xarelto.  Can be discharged to home today.  Close follow-up in the office in 2 days.

## 2015-09-14 NOTE — Care Management Important Message (Signed)
Important Message  Patient Details  Name: STARLA MALL MRN: [id number] Date of Birth: 12/28/1945   Medicare Important Message Given:  Yes    Camillo Flaming 09/14/2015, 11:30 AMImportant Message  Patient Details  Name: LAMYA GEORGIADIS MRN: [id number] Date of Birth: 1945/07/18   Medicare Important Message Given:  Yes    Camillo Flaming 09/14/2015, 11:29 AM

## 2015-09-14 NOTE — Progress Notes (Signed)
Patient ID: Lori Bradford, female   DOB: 02/19/1946, 70 y.o.   MRN: [id number] Cultures negative thus far.  May have been just a large post-operative seroma.  And she has been on a high dose of Xarelto at home and may have been taking more due to confusion of how many per day.  This was to treat a DVT.  I feel now the risks of the medication are outweighing its benefit.  She is draining serous-anginous fluid from her incisions.  Will stop Xarelto at this point and have her just take a 325 mg aspirin.  Will check cultures again and possibly discharge to home this afternoon on oral antibiotics.
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
## 2015-09-14 NOTE — Progress Notes (Signed)
Pt to d/c home. Dressing still clean and intact. Patient educated about keeping incision clean and dry and changing dressings as needed. Patient understands to follow up with Dr. Ninfa Linden in two days. Patient excited about going home. Going home with daughter and granddaughter.

## 2015-09-14 NOTE — Discharge Instructions (Signed)
Keep dry dressings on your right hip incisions and expect bloody drainage.

## 2015-09-14 NOTE — Progress Notes (Signed)
Pharmacy Antibiotic Note  Lori Bradford is a 70 y.o. female admitted on 09/11/2015 with right hip infection s/p removal of hardware and conversion to THA, I/D on 4/7.  Marland Kitchen  Pharmacy has been consulted for vancomycin dosing. Noted plan for 4-6 weeks of IV abx.   Scr has increased from 0.55 (4/6) --> 1.03 (4/9)   Plan: Hold Vancomycin due to elevated trough level Follow random vancomycin levels and will resume dosing of Vancomycin once level < 20 mcg/ml  Height: 5' 2.5" (158.8 cm) Weight: 209 lb (94.802 kg) IBW/kg (Calculated) : 51.25  Temp (24hrs), Avg:98.2 F (36.8 C), Min:97.8 F (36.6 C), Max:98.9 F (37.2 C)   Recent Labs Lab 09/10/15 1430 09/12/15 0733 09/13/15 2337  WBC 6.2 5.1  --   CREATININE 0.55  --  1.03*  VANCOTROUGH  --   --  38*    Estimated Creatinine Clearance: 55.9 mL/min (by C-G formula based on Cr of 1.03).     Antimicrobials this admission: 4/7 vanc >>  Dose adjustments this admission: 4/9 VT @ 23:37 = 38 mcg/ml on Vanc 1250mg  IV q12h 4/10 Vrandom @ 13:00 ___________________  Microbiology results: 4/7 wound culture: NGTD  Thank you for allowing pharmacy to be a part of this patient's care.  Leone Haven, PharmD  09/14/2015, 12:39 AM

## 2015-09-16 LAB — ANAEROBIC CULTURE

## 2015-10-14 DIAGNOSIS — Z471 Aftercare following joint replacement surgery: Secondary | ICD-10-CM | POA: Diagnosis not present

## 2015-10-14 DIAGNOSIS — L03115 Cellulitis of right lower limb: Secondary | ICD-10-CM | POA: Diagnosis not present

## 2015-10-14 DIAGNOSIS — M25512 Pain in left shoulder: Secondary | ICD-10-CM | POA: Diagnosis not present

## 2015-10-14 DIAGNOSIS — M1711 Unilateral primary osteoarthritis, right knee: Secondary | ICD-10-CM | POA: Diagnosis not present

## 2015-10-14 DIAGNOSIS — K219 Gastro-esophageal reflux disease without esophagitis: Secondary | ICD-10-CM | POA: Diagnosis not present

## 2015-10-14 DIAGNOSIS — I10 Essential (primary) hypertension: Secondary | ICD-10-CM | POA: Diagnosis not present

## 2015-10-20 ENCOUNTER — Ambulatory Visit (INDEPENDENT_AMBULATORY_CARE_PROVIDER_SITE_OTHER): Payer: Commercial Managed Care - HMO | Admitting: Internal Medicine

## 2015-10-20 ENCOUNTER — Encounter: Payer: Self-pay | Admitting: Internal Medicine

## 2015-10-20 VITALS — BP 131/38 | HR 104 | Temp 98.3°F | Ht 62.0 in | Wt 185.8 lb

## 2015-10-20 DIAGNOSIS — D649 Anemia, unspecified: Secondary | ICD-10-CM

## 2015-10-20 DIAGNOSIS — I10 Essential (primary) hypertension: Secondary | ICD-10-CM | POA: Diagnosis not present

## 2015-10-20 DIAGNOSIS — M8580 Other specified disorders of bone density and structure, unspecified site: Secondary | ICD-10-CM

## 2015-10-20 DIAGNOSIS — R7303 Prediabetes: Secondary | ICD-10-CM | POA: Diagnosis not present

## 2015-10-20 DIAGNOSIS — Z86718 Personal history of other venous thrombosis and embolism: Secondary | ICD-10-CM | POA: Diagnosis not present

## 2015-10-20 DIAGNOSIS — I82412 Acute embolism and thrombosis of left femoral vein: Secondary | ICD-10-CM

## 2015-10-20 NOTE — Patient Instructions (Addendum)
It was nice seeing you today.  We will like you to get your mammogram later this year, also you should get a CT scan- July- 2017. This is important.  Also remain active, with your legs especially to reduce your chances of getting a blood clot.   Also continue taking the antibiotic as prescribed by your Orthopedic doctor.   We will consider you starting you on a Cholesterol medication- like lipitor or Crestor or other cheaper types, on your next visit to reduce your chances of a heart attack and stroke.   Cont taking your Blood pressure medication.

## 2015-10-20 NOTE — Assessment & Plan Note (Signed)
Hgb low at 8, likely related to hip surgeries- 2 surgeries since January and subsequent hip joint infection requiring wash out and irrigation.   Plan- Cont Oral iron + stool softners. - CBC check today, will not check Serum iron or ferritin at this time, unless anemia fails to improve on therapy.

## 2015-10-20 NOTE — Assessment & Plan Note (Signed)
Will recheck HgbA1c today. She has lost some weight with the problems and now 3 surgeries she had with her left hip. Also her ASCVD risk using pooled cohort equations- 11.1%- calculated 10 year risk of CVD, ( high risk) she should be on a statin, at least moderate intensity.  Pt declines treatment at this time. She says she is on a lot of medications. Readdress in future visit. Can check lipid panel.

## 2015-10-20 NOTE — Assessment & Plan Note (Signed)
Osteopenia by DEXA scan- 2014. Encouraged continued Vit D with Ca. Pt endorses compliance.

## 2015-10-20 NOTE — Progress Notes (Signed)
Case discussed with Dr. Denton Brick soon after the resident saw the patient. We reviewed the resident's history and exam and pertinent patient test results. I agree with the assessment, diagnosis, and plan of care documented in the resident's note.  At the follow-up visit we will also check a potassium level to make sure it has been appropriately repleted.
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
## 2015-10-20 NOTE — Assessment & Plan Note (Signed)
Still declines colonoscopy. No need for pap smear with her age. Plan- Stool card next visit.

## 2015-10-20 NOTE — Assessment & Plan Note (Addendum)
BP Readings from Last 3 Encounters:  10/20/15 131/38  09/14/15 128/74  09/10/15 138/61    Lab Results  Component Value Date   NA 141 09/13/2015   K 2.9* 09/13/2015   CREATININE 1.03* 09/13/2015    Assessment: Blood pressure control:   Controlled, wide pulse pressure Comments: She was told to restart her Bp meds by her ortho as she Bp was high at that visit. She has been taking- lisinopril-HCTZ 20-12.5mg .  Plan: Medications:  Will reduce antiHTN agents to half the dose for now, considering diastolic of 38. She has no mumurs that would suggest aortic insufficiency.  ( Unfortunately I noticed pt diastholic Bp after pt left, will call patient back and inform her of adjustment and check Bp at home).  Addendum- Called patient back- her Bp has been running- systolic [id number], diastolic [id number], so will continue HCTZ-lisinopril- 20-12.5 daily.  She will also come in for a lab check only,as her on her Bmet- K was low at 2.9, when she was on admission 09/13/15 for ortho Surgery. Will replete as needed.

## 2015-10-20 NOTE — Assessment & Plan Note (Addendum)
Started on Xarelto for DVT- March, for provoked DVT in the setting of hip surgery. Initial planned anticoag for 3-6 months and then reaccess. She has had another hip surgery since she was started on antiicoag.  She has been off Xarelto for ~3 weeks, she says she was told to stop by her orthopedist. She was told instead to take 2 baby aspirins everyday. Her hgb dropped to 8 in April- most likely related to recent surgery, as she had normal hgb prior to fall and surgeries. Today she has no evidence of DVT. I encouraged her to keep active to prevent re-occurrence of blood clots.  Plan- request records from ortho.

## 2015-10-21 LAB — CBC
HEMATOCRIT: 38 % (ref 34.0–46.6)
HEMOGLOBIN: 11.9 g/dL (ref 11.1–15.9)
MCH: 27.7 pg (ref 26.6–33.0)
MCHC: 31.3 g/dL — AB (ref 31.5–35.7)
MCV: 88 fL (ref 79–97)
Platelets: 418 10*3/uL — ABNORMAL HIGH (ref 150–379)
RBC: 4.3 x10E6/uL (ref 3.77–5.28)
RDW: 15.8 % — ABNORMAL HIGH (ref 12.3–15.4)
WBC: 9.3 10*3/uL (ref 3.4–10.8)

## 2015-10-21 LAB — HEMOGLOBIN A1C
Est. average glucose Bld gHb Est-mCnc: 103 mg/dL
Hgb A1c MFr Bld: 5.2 % (ref 4.8–5.6)

## 2015-10-30 ENCOUNTER — Telehealth: Payer: Self-pay

## 2015-10-30 ENCOUNTER — Other Ambulatory Visit: Payer: Self-pay | Admitting: Internal Medicine

## 2015-10-30 DIAGNOSIS — I10 Essential (primary) hypertension: Secondary | ICD-10-CM

## 2015-10-30 NOTE — Telephone Encounter (Signed)
Called patient back.

## 2015-10-30 NOTE — Telephone Encounter (Signed)
Pt requesting lab result. Please call pt back.

## 2015-11-04 ENCOUNTER — Other Ambulatory Visit (INDEPENDENT_AMBULATORY_CARE_PROVIDER_SITE_OTHER): Payer: Commercial Managed Care - HMO

## 2015-11-04 DIAGNOSIS — I10 Essential (primary) hypertension: Secondary | ICD-10-CM

## 2015-11-05 LAB — BMP8+ANION GAP
ANION GAP: 18 mmol/L (ref 10.0–18.0)
BUN/Creatinine Ratio: 7 — ABNORMAL LOW (ref 12–28)
BUN: 5 mg/dL — AB (ref 8–27)
CALCIUM: 9.8 mg/dL (ref 8.7–10.3)
CHLORIDE: 95 mmol/L — AB (ref 96–106)
CO2: 24 mmol/L (ref 18–29)
Creatinine, Ser: 0.67 mg/dL (ref 0.57–1.00)
GFR calc Af Amer: 104 mL/min/{1.73_m2} (ref >59)
GFR calc non Af Amer: 90 mL/min/{1.73_m2} (ref >59)
GLUCOSE: 87 mg/dL (ref 65–99)
POTASSIUM: 4.4 mmol/L (ref 3.5–5.2)
Sodium: 137 mmol/L (ref 134–144)

## 2015-11-05 LAB — MAGNESIUM: Magnesium: 1.8 mg/dL (ref 1.6–2.3)

## 2015-11-14 DIAGNOSIS — K219 Gastro-esophageal reflux disease without esophagitis: Secondary | ICD-10-CM | POA: Diagnosis not present

## 2015-11-14 DIAGNOSIS — Z471 Aftercare following joint replacement surgery: Secondary | ICD-10-CM | POA: Diagnosis not present

## 2015-11-14 DIAGNOSIS — M1711 Unilateral primary osteoarthritis, right knee: Secondary | ICD-10-CM | POA: Diagnosis not present

## 2015-11-14 DIAGNOSIS — I10 Essential (primary) hypertension: Secondary | ICD-10-CM | POA: Diagnosis not present

## 2015-11-20 ENCOUNTER — Encounter: Payer: Self-pay | Admitting: *Deleted

## 2015-11-25 ENCOUNTER — Telehealth: Payer: Self-pay | Admitting: Internal Medicine

## 2015-11-25 NOTE — Telephone Encounter (Signed)
Called patient about prior DEXA scan- 2014, and that we would recommend that a repeat DEXA considering her recent fall, though this was a mechanical fall, when she slipped on ICE past Winter. Last DEXA showed Osteopenia, she is presently on Vitamin D and Ca. She might need more aggressive therapy if her BMD is worse/Osteopenia has progressed to osteoporosis. Got Voice mail, left message.   Lori Neighbors, MD.  [facility]- Resident.

## 2015-11-26 ENCOUNTER — Telehealth: Payer: Self-pay | Admitting: Internal Medicine

## 2015-11-26 DIAGNOSIS — M75101 Unspecified rotator cuff tear or rupture of right shoulder, not specified as traumatic: Secondary | ICD-10-CM

## 2015-11-26 DIAGNOSIS — Z13820 Encounter for screening for osteoporosis: Secondary | ICD-10-CM

## 2015-11-26 DIAGNOSIS — M75102 Unspecified rotator cuff tear or rupture of left shoulder, not specified as traumatic: Secondary | ICD-10-CM

## 2015-11-26 DIAGNOSIS — M8580 Other specified disorders of bone density and structure, unspecified site: Secondary | ICD-10-CM

## 2015-11-26 NOTE — Telephone Encounter (Signed)
Pt calling back states dr Denton Brick called her yesterday about repeating a DEXA

## 2015-12-01 NOTE — Telephone Encounter (Signed)
If she is agreeable to DEXA, I will order it for her. Has osteopenia on DEXA 2014. Recent fall - Dr E wanted to reasess bones. She needs to cont her calcium and Vit D

## 2015-12-01 NOTE — Telephone Encounter (Signed)
Order for DEXA and sports med entered. Routed to triage and [facility]

## 2015-12-01 NOTE — Telephone Encounter (Signed)
Spoke w/ pt, she is good w/ another dexa, also she states she will continue the meds and she also needs a new referral to dr neal for her neck, this ongoing and dr neal gives her injections in her neck from time to time

## 2015-12-04 ENCOUNTER — Ambulatory Visit: Payer: Commercial Managed Care - HMO | Admitting: Family Medicine

## 2015-12-11 ENCOUNTER — Encounter: Payer: Self-pay | Admitting: Family Medicine

## 2015-12-11 ENCOUNTER — Ambulatory Visit (INDEPENDENT_AMBULATORY_CARE_PROVIDER_SITE_OTHER): Payer: Commercial Managed Care - HMO | Admitting: Family Medicine

## 2015-12-11 VITALS — BP 156/67 | HR 77 | Ht 62.0 in | Wt 182.0 lb

## 2015-12-11 DIAGNOSIS — M75101 Unspecified rotator cuff tear or rupture of right shoulder, not specified as traumatic: Secondary | ICD-10-CM

## 2015-12-11 DIAGNOSIS — M75102 Unspecified rotator cuff tear or rupture of left shoulder, not specified as traumatic: Secondary | ICD-10-CM | POA: Diagnosis not present

## 2015-12-11 MED ORDER — METHYLPREDNISOLONE ACETATE 40 MG/ML IJ SUSP
40.0000 mg | Freq: Once | INTRAMUSCULAR | Status: AC
  Administered 2015-12-11: 40 mg via INTRA_ARTICULAR

## 2015-12-11 MED ORDER — IBUPROFEN 600 MG PO TABS: 600.0000 mg | ORAL_TABLET | Freq: Three times a day (TID) | ORAL | Status: DC | PRN

## 2015-12-14 DIAGNOSIS — M1711 Unilateral primary osteoarthritis, right knee: Secondary | ICD-10-CM | POA: Diagnosis not present

## 2015-12-14 DIAGNOSIS — I10 Essential (primary) hypertension: Secondary | ICD-10-CM | POA: Diagnosis not present

## 2015-12-14 DIAGNOSIS — K219 Gastro-esophageal reflux disease without esophagitis: Secondary | ICD-10-CM | POA: Diagnosis not present

## 2015-12-14 DIAGNOSIS — Z471 Aftercare following joint replacement surgery: Secondary | ICD-10-CM | POA: Diagnosis not present

## 2015-12-15 NOTE — Progress Notes (Signed)
   Subjective:    Patient ID: Lori Bradford, female    DOB: 06-02-1946, 70 y.o.   MRN: [id number]  HPI Left shoulder, upper arm and neck pain for the last 3-6 months. Gradually worsening. Has a feels like her arm is "going to come out of socket". She's having problems sleeping on that side. Pain is 6-7 out of 10 at its worst. Present most of the time. Nothing seems to make it better but rest.  Pertinent recent history she had a hip fracture on the right fixed in January.   Review of Systems No unusual weight change, fever, sweats, chills.    Objective:   Physical Exam  Vital signs are reviewed GEN.: Well-developed female no acute distress SHOULDERS: Symmetrical. Left shoulder is full range of motion but she has pain above 90 in forward flexion or abduction. Internal rotation somewhat limited and she can place her left hand at the posterior hip but not all the way back to the lumbar spine. The right hand will reach back to the middle of her lumbar area. Positive impingement sign on the left. VASCULAR: Radial pulses 2+ bilaterally symmetrical NEURO: Intact sensation soft touch bilaterally in her upper extremities SKIN: Area around the left shoulder reveals no sign or rash, no increased erythema, no increased warmth.  INJECTION: Patient was given informed consent, signed copy in the chart. Appropriate time out was taken. Area prepped and draped in usual sterile fashion. 1 cc of methylprednisolone 40 mg/ml plus  4 cc of 1% lidocaine without epinephrine was injected into the left subacromial bursa using a(n) posterior approach. The patient tolerated the procedure well. There were no complications. Post procedure instructions were given.       Assessment & Plan:

## 2015-12-15 NOTE — Assessment & Plan Note (Signed)
I think she has some subacromial bursitis. We'll start her back on the home exercise program. We gave her corticosteroid injection today. I'll see her back in 4-6 weeks.

## 2015-12-22 ENCOUNTER — Other Ambulatory Visit: Payer: Self-pay | Admitting: Internal Medicine

## 2015-12-22 DIAGNOSIS — G4700 Insomnia, unspecified: Secondary | ICD-10-CM

## 2015-12-22 MED ORDER — ALPRAZOLAM 1 MG PO TABS: 1.0000 mg | ORAL_TABLET | Freq: Every day | ORAL | Status: DC

## 2015-12-22 NOTE — Telephone Encounter (Signed)
ALPRAZolam (XANAX) 1 MG tablet [facility] [place]

## 2015-12-22 NOTE — Telephone Encounter (Signed)
Called to pharm 

## 2015-12-23 ENCOUNTER — Telehealth: Payer: Self-pay

## 2015-12-23 NOTE — Telephone Encounter (Signed)
Tech at [employer] not entering dea correctly

## 2015-12-23 NOTE — Telephone Encounter (Signed)
[name] from [facility] requesting the nurse to call back regarding Xanax and DEA number.

## 2016-01-06 ENCOUNTER — Telehealth: Payer: Self-pay | Admitting: Internal Medicine

## 2016-01-06 NOTE — Telephone Encounter (Signed)
APT. REMINDER CALL, LMTCB °

## 2016-01-07 ENCOUNTER — Ambulatory Visit (INDEPENDENT_AMBULATORY_CARE_PROVIDER_SITE_OTHER): Payer: Commercial Managed Care - HMO | Admitting: Internal Medicine

## 2016-01-07 ENCOUNTER — Encounter: Payer: Self-pay | Admitting: Internal Medicine

## 2016-01-07 VITALS — BP 149/54 | HR 73 | Temp 98.1°F | Ht 62.5 in | Wt 184.1 lb

## 2016-01-07 DIAGNOSIS — F5104 Psychophysiologic insomnia: Secondary | ICD-10-CM

## 2016-01-07 DIAGNOSIS — E669 Obesity, unspecified: Secondary | ICD-10-CM

## 2016-01-07 DIAGNOSIS — R911 Solitary pulmonary nodule: Secondary | ICD-10-CM | POA: Diagnosis not present

## 2016-01-07 DIAGNOSIS — Z0000 Encounter for general adult medical examination without abnormal findings: Secondary | ICD-10-CM

## 2016-01-07 DIAGNOSIS — I10 Essential (primary) hypertension: Secondary | ICD-10-CM | POA: Diagnosis not present

## 2016-01-07 DIAGNOSIS — G4700 Insomnia, unspecified: Secondary | ICD-10-CM

## 2016-01-07 DIAGNOSIS — Z6833 Body mass index (BMI) 33.0-33.9, adult: Secondary | ICD-10-CM

## 2016-01-07 MED ORDER — ALPRAZOLAM 1 MG PO TABS: 1.0000 mg | ORAL_TABLET | Freq: Every day | ORAL | 0 refills | Status: DC

## 2016-01-07 NOTE — Assessment & Plan Note (Signed)
Patient with incidental solitary pulmonary nodule noted on CT scan on 06/17/2015. She denies smoking history and is low risk for pulmonary malignancy. The Fleischner Society guidelines recommend reimaging of solitary pulmonary nodules and low risk patients 6-12 months following initial finding.  -- CT scan ordered for September -- We will follow-up with results

## 2016-01-07 NOTE — Assessment & Plan Note (Signed)
Patient continues to have insomnia. This is not changed from baseline. She only takes 1 mg Xanax every night which helps with her anxiety and insomnia. Currently, she needs a refill of this medication I think this is appropriate at this time. -- Refill Xanax -- Take 1 mg at night for sleep -- Return to clinic in 3 months for follow-up

## 2016-01-07 NOTE — Patient Instructions (Addendum)
It was a pleasure seeing you today. Thank you for choosing [facility] for your healthcare needs.  -- Continue Xanax once per night per sleep -- CT Scan of the chest to follow-up on nodule scheduled for 1 month  -- Continue to exercise and lose weight, monitor blood pressure at home and if it stays elevated return to clinic sooner -- Return to clinic to see Dr. Lovena Le in 6 months

## 2016-01-07 NOTE — Progress Notes (Addendum)
   CC: Medication refill HPI: [name] is a 70 y.o. female with a h/o of insomnia, depression, anxiety, DVT, hypertension and a solitary pulmonary nodule who presents for medication refill of Xanax. She denies headaches, changes in vision, shortness of breath, chest pain, nausea, vomiting or abdominal pain. She denies fevers, chills or night sweats. She has no additional acute complaints or concerns today's visit.  Please see problem-based charting for status of medical issues pertinent to this visit.     Review of Systems: A complete ROS was negative except as per HPI.  Physical Exam: Vitals:   01/07/16 1416  BP: (!) 149/54  Pulse: 73  Temp: 98.1 F (36.7 C)  TempSrc: Oral  SpO2: 98%  Weight: 184 lb 1.6 oz (83.5 kg)  Height: 5' 2.5" (1.588 m)   BP (!) 149/54 (BP Location: Right Arm, Patient Position: Sitting, Cuff Size: Normal)   Pulse 73   Temp 98.1 F (36.7 C) (Oral)   Ht 5' 2.5" (1.588 m)   Wt 184 lb 1.6 oz (83.5 kg)   SpO2 98% Comment: room air  BMI 33.14 kg/m  General appearance: alert and cooperative Lungs: clear to auscultation bilaterally Heart: regular rate and rhythm, S1, S2 normal, no murmur, click, rub or gallop Abdomen: soft, non-tender; bowel sounds normal; no masses,  no organomegaly and No abdominal bruits auscultated Extremities: extremities normal, atraumatic, no cyanosis or edema  Assessment & Plan:  See encounters tab for problem based medical decision making. Patient seen with Dr. Eppie Gibson  Signed: Ophelia Shoulder, MD 01/07/2016, 5:23 PM  Pager: [phone]

## 2016-01-07 NOTE — Assessment & Plan Note (Signed)
Patient has lost 50 pounds since January. She is determined to continue to lose weight. I told the patient that she had done a great job encouraged her to continue with lifestyle modifications.

## 2016-01-07 NOTE — Assessment & Plan Note (Signed)
Patient needs colonoscopy. I discussed colonoscopy but the patient did not want this procedure as she states she would not get treatment for malignancy if it was identified. After much discussion the patient said that she was willing to reconsider this and wishes to discuss this at her next appointment in 6 months. -- Discuss colonoscopy at next visit

## 2016-01-07 NOTE — Assessment & Plan Note (Signed)
Patient states that she has lost 50 pounds since January. She has started exercising and has been watching her diet. I will not make changes to her blood pressure regimen at this time. I encouraged the patient to continue to lose weight and eat healthy foods. She agreed with this plan and wants to try lifestyle modification before considering medication for hypertension. -- Diet and exercise -- Return to clinic in 6 months for blood pressure evaluation

## 2016-01-08 NOTE — Progress Notes (Signed)
I saw and evaluated the patient.  I personally confirmed the key portions of Dr. Taylor's history and exam and reviewed pertinent patient test results.  The assessment, diagnosis, and plan were formulated together and I agree with the documentation in the resident's note. 

## 2016-01-14 ENCOUNTER — Ambulatory Visit (HOSPITAL_COMMUNITY)
Admission: RE | Admit: 2016-01-14 | Discharge: 2016-01-14 | Disposition: A | Discharge status: Home / Self Care | Payer: Commercial Managed Care - HMO | Source: Ambulatory Visit | Attending: Oncology | Admitting: Oncology

## 2016-01-14 DIAGNOSIS — I10 Essential (primary) hypertension: Secondary | ICD-10-CM | POA: Diagnosis not present

## 2016-01-14 DIAGNOSIS — M1711 Unilateral primary osteoarthritis, right knee: Secondary | ICD-10-CM | POA: Diagnosis not present

## 2016-01-14 DIAGNOSIS — R911 Solitary pulmonary nodule: Secondary | ICD-10-CM

## 2016-01-14 DIAGNOSIS — I700 Atherosclerosis of aorta: Secondary | ICD-10-CM | POA: Insufficient documentation

## 2016-01-14 DIAGNOSIS — K7689 Other specified diseases of liver: Secondary | ICD-10-CM | POA: Diagnosis not present

## 2016-01-14 DIAGNOSIS — K219 Gastro-esophageal reflux disease without esophagitis: Secondary | ICD-10-CM | POA: Diagnosis not present

## 2016-01-14 DIAGNOSIS — Z471 Aftercare following joint replacement surgery: Secondary | ICD-10-CM | POA: Diagnosis not present

## 2016-01-14 DIAGNOSIS — S32021A Stable burst fracture of second lumbar vertebra, initial encounter for closed fracture: Secondary | ICD-10-CM | POA: Diagnosis not present

## 2016-01-14 DIAGNOSIS — X58XXXA Exposure to other specified factors, initial encounter: Secondary | ICD-10-CM | POA: Insufficient documentation

## 2016-01-15 ENCOUNTER — Ambulatory Visit: Payer: Commercial Managed Care - HMO | Admitting: Family Medicine

## 2016-01-15 NOTE — Telephone Encounter (Signed)
I called and discussed with Lori Bradford the results of her CT scan. The nodule has not changed in size or character and she will not need follow-up chest CT imaging for 2 years. She will need repeat chest CT w/o contrast around 01/2018.

## 2016-01-29 ENCOUNTER — Telehealth: Payer: Self-pay | Admitting: Internal Medicine

## 2016-01-29 DIAGNOSIS — Z0000 Encounter for general adult medical examination without abnormal findings: Secondary | ICD-10-CM

## 2016-01-29 NOTE — Telephone Encounter (Signed)
I have placed an order for the patient to have her screening mammogram.

## 2016-02-01 ENCOUNTER — Other Ambulatory Visit: Payer: Self-pay | Admitting: Internal Medicine

## 2016-02-01 ENCOUNTER — Telehealth: Payer: Self-pay | Admitting: Internal Medicine

## 2016-02-01 ENCOUNTER — Telehealth: Payer: Self-pay | Admitting: *Deleted

## 2016-02-01 DIAGNOSIS — Z1231 Encounter for screening mammogram for malignant neoplasm of breast: Secondary | ICD-10-CM

## 2016-02-01 NOTE — Telephone Encounter (Signed)
Patient has a standing order for a screening mammography.

## 2016-02-14 DIAGNOSIS — K219 Gastro-esophageal reflux disease without esophagitis: Secondary | ICD-10-CM | POA: Diagnosis not present

## 2016-02-14 DIAGNOSIS — Z471 Aftercare following joint replacement surgery: Secondary | ICD-10-CM | POA: Diagnosis not present

## 2016-02-14 DIAGNOSIS — M1711 Unilateral primary osteoarthritis, right knee: Secondary | ICD-10-CM | POA: Diagnosis not present

## 2016-02-14 DIAGNOSIS — I10 Essential (primary) hypertension: Secondary | ICD-10-CM | POA: Diagnosis not present

## 2016-02-19 ENCOUNTER — Ambulatory Visit: Payer: Commercial Managed Care - HMO

## 2016-03-10 ENCOUNTER — Ambulatory Visit (INDEPENDENT_AMBULATORY_CARE_PROVIDER_SITE_OTHER): Payer: Commercial Managed Care - HMO | Admitting: *Deleted

## 2016-03-10 ENCOUNTER — Ambulatory Visit
Admission: RE | Admit: 2016-03-10 | Discharge: 2016-03-10 | Disposition: A | Discharge status: Home / Self Care | Payer: Commercial Managed Care - HMO | Source: Ambulatory Visit | Attending: Internal Medicine | Admitting: Internal Medicine

## 2016-03-10 DIAGNOSIS — Z1231 Encounter for screening mammogram for malignant neoplasm of breast: Secondary | ICD-10-CM

## 2016-03-10 DIAGNOSIS — Z23 Encounter for immunization: Secondary | ICD-10-CM | POA: Diagnosis not present

## 2016-03-15 DIAGNOSIS — K219 Gastro-esophageal reflux disease without esophagitis: Secondary | ICD-10-CM | POA: Diagnosis not present

## 2016-03-15 DIAGNOSIS — I10 Essential (primary) hypertension: Secondary | ICD-10-CM | POA: Diagnosis not present

## 2016-03-15 DIAGNOSIS — M1711 Unilateral primary osteoarthritis, right knee: Secondary | ICD-10-CM | POA: Diagnosis not present

## 2016-03-15 DIAGNOSIS — Z471 Aftercare following joint replacement surgery: Secondary | ICD-10-CM | POA: Diagnosis not present

## 2016-03-29 ENCOUNTER — Other Ambulatory Visit: Payer: Self-pay | Admitting: Internal Medicine

## 2016-03-29 NOTE — Telephone Encounter (Signed)
Needs refill for a acid reflux medicine doesn't remember the name said it has been a long time since she had gotten it filled.

## 2016-03-30 MED ORDER — OMEPRAZOLE 20 MG PO CPDR: 20.0000 mg | DELAYED_RELEASE_CAPSULE | Freq: Every day | ORAL | 1 refills | Status: DC

## 2016-04-07 ENCOUNTER — Ambulatory Visit (INDEPENDENT_AMBULATORY_CARE_PROVIDER_SITE_OTHER): Payer: Commercial Managed Care - HMO | Admitting: Internal Medicine

## 2016-04-07 VITALS — BP 146/58 | HR 84 | Temp 98.0°F | Ht 62.5 in | Wt 189.0 lb

## 2016-04-07 DIAGNOSIS — Z23 Encounter for immunization: Secondary | ICD-10-CM

## 2016-04-07 DIAGNOSIS — R911 Solitary pulmonary nodule: Secondary | ICD-10-CM | POA: Diagnosis not present

## 2016-04-07 DIAGNOSIS — F418 Other specified anxiety disorders: Secondary | ICD-10-CM

## 2016-04-07 DIAGNOSIS — I10 Essential (primary) hypertension: Secondary | ICD-10-CM | POA: Diagnosis not present

## 2016-04-07 DIAGNOSIS — M19041 Primary osteoarthritis, right hand: Secondary | ICD-10-CM

## 2016-04-07 DIAGNOSIS — M19042 Primary osteoarthritis, left hand: Secondary | ICD-10-CM | POA: Diagnosis not present

## 2016-04-07 DIAGNOSIS — Z0000 Encounter for general adult medical examination without abnormal findings: Secondary | ICD-10-CM

## 2016-04-07 DIAGNOSIS — Z79899 Other long term (current) drug therapy: Secondary | ICD-10-CM

## 2016-04-07 DIAGNOSIS — F341 Dysthymic disorder: Secondary | ICD-10-CM

## 2016-04-07 MED ORDER — DICLOFENAC SODIUM 1 % TD GEL: 2.0000 g | Freq: Four times a day (QID) | TRANSDERMAL | 1 refills | Status: DC

## 2016-04-07 NOTE — Patient Instructions (Signed)
It was a pleasure seeing you today. Thank you for choosing [facility] for your healthcare needs.   Keep up the [employer] work!  I will see you in three months.

## 2016-04-07 NOTE — Assessment & Plan Note (Addendum)
Patient has a diagnosis of essential hypertension. Currently, she is not on any antihypertensive medications that she's been able to control her blood pressure with diet and exercise. As documented in my prior note the patient has a reported 50 pound weight loss which has been intentional. Following losing as much weight she has had improvements in her hemoglobin A1c and her blood pressure. Her blood pressure today in clinic was 146/58. In this 70 year old female my goal blood pressure for this patient would be a systolic less than [id number]. She is adamant that she does not was start antihypertensive medications and given that her blood pressure is slightly below goal I think it is reasonable to not start blood pressure medication at this time. She will continue to work on weight loss, diet and exercise. --Continue to monitor blood pressure

## 2016-04-07 NOTE — Progress Notes (Signed)
   CC: Arthritis HPI: [name] is a 70 y.o. female with a h/o of depression, anxiety, DVT, hypertension and a solitary pulmonary nodule who presents with for routine health care follow-up and to discuss arthritis in her hands. She denied nausea, vomiting, chest pain or shortness of breath. She denied diarrhea or constipation. She denied fevers, chills, night sweats or unintentional weight loss. She also requested the pneumonia vaccine today. She had no additional questions.     Review of Systems: A complete ROS was negative except as per HPI.  Physical Exam: There were no vitals filed for this visit. General appearance: alert and cooperative Head: Normocephalic, without obvious abnormality, atraumatic Lungs: clear to auscultation bilaterally Heart: regular rate and rhythm, S1, S2 normal, no murmur, click, rub or gallop Abdomen: soft, non-tender; bowel sounds normal; no masses,  no organomegaly Extremities: extremities normal, atraumatic, no cyanosis or edema  Assessment & Plan:  See encounters tab for problem based medical decision making. Patient seen with Dr. Daryll Drown  Signed: Ophelia Shoulder, MD 04/07/2016, 1:22 PM  Pager: [phone]

## 2016-04-07 NOTE — Assessment & Plan Note (Signed)
Patient complains of pain that is worse in the DIP of both hands bilaterally. Heberdens nodes are present on examination. She does have morning stiffness but this is improved relatively early. I think the most likely etiology for the patient's hand pain is secondary to osteoarthritis. She has a working history which required lots of motion and mechanical use of both of her hands. At this time I do not think imaging is necessary. We will treat symptomatically for osteoarthritis. -- Voltaren gel -- Tylenol as needed

## 2016-04-07 NOTE — Assessment & Plan Note (Signed)
The patient has a history of a solitary pulmonary nodule. At her previous visit I scheduled for repeat low dose chest CT to evaluate for any change within the interval of this nodule that would be concerning for potential malignancy. Results of this imaging  study can be found below.  1. Stable 8 mm right upper lobe ground-glass density from baseline examination of 7 months ago. Repeat CT is recommended every 2 years until 5 years of stability has been established. This recommendation follows the consensus statement: Guidelines for Management of Incidental Pulmonary Nodules Detected on CT Images:From the Fleischner Society 2017; published online before print  -- Based on current guidelines for the management of incidental pulmonary nodules detected on CT images the patient will need a repeat CT scan in 2019. For additional information, please refer to the published guidelines by the Fleischner Society.

## 2016-04-07 NOTE — Assessment & Plan Note (Signed)
Her anxiety and depression is stable on her current medication regimen. -- Continue current medications

## 2016-04-07 NOTE — Assessment & Plan Note (Addendum)
At her previous visit I arrange for the patient to have mammography. There was no mammographic evidence of malignancy on this study. She will need repeat screening in approximately one year. She continues to refuse colon cancer screening. She will not undergo colonoscopy or fecal occult blood testing. She says that even if the cancer was identified she would not pursue treatment. As the patient is refusing to consider treatment if cancer is identified on the screening modalities we will not screen for colon cancer at this time. I will continue to discuss with her the risks and benefits of colon cancer screening in the future. -- Continue with routine breast cancer screening -- Continue to discuss colon cancer screening

## 2016-04-08 NOTE — Progress Notes (Signed)
Internal Medicine Clinic Attending  I saw and evaluated the patient.  I personally confirmed the key portions of the history and exam documented by Dr. Taylor and I reviewed pertinent patient test results.  The assessment, diagnosis, and plan were formulated together and I agree with the documentation in the resident's note.  

## 2016-04-15 DIAGNOSIS — I10 Essential (primary) hypertension: Secondary | ICD-10-CM | POA: Diagnosis not present

## 2016-04-15 DIAGNOSIS — M1711 Unilateral primary osteoarthritis, right knee: Secondary | ICD-10-CM | POA: Diagnosis not present

## 2016-04-15 DIAGNOSIS — Z471 Aftercare following joint replacement surgery: Secondary | ICD-10-CM | POA: Diagnosis not present

## 2016-04-15 DIAGNOSIS — K219 Gastro-esophageal reflux disease without esophagitis: Secondary | ICD-10-CM | POA: Diagnosis not present

## 2016-05-15 DIAGNOSIS — M1711 Unilateral primary osteoarthritis, right knee: Secondary | ICD-10-CM | POA: Diagnosis not present

## 2016-05-15 DIAGNOSIS — K219 Gastro-esophageal reflux disease without esophagitis: Secondary | ICD-10-CM | POA: Diagnosis not present

## 2016-05-15 DIAGNOSIS — Z471 Aftercare following joint replacement surgery: Secondary | ICD-10-CM | POA: Diagnosis not present

## 2016-05-15 DIAGNOSIS — I10 Essential (primary) hypertension: Secondary | ICD-10-CM | POA: Diagnosis not present

## 2016-06-10 ENCOUNTER — Other Ambulatory Visit (INDEPENDENT_AMBULATORY_CARE_PROVIDER_SITE_OTHER): Payer: Self-pay | Admitting: Physician Assistant

## 2016-06-10 NOTE — Telephone Encounter (Signed)
Rx refill request

## 2016-06-13 NOTE — Telephone Encounter (Signed)
Rx has been called into patient pharmacy.

## 2016-06-21 ENCOUNTER — Telehealth: Payer: Self-pay | Admitting: Internal Medicine

## 2016-06-21 NOTE — Telephone Encounter (Signed)
Calling about xanax states that we need to call it in under attending name.

## 2016-06-21 NOTE — Telephone Encounter (Signed)
ALPRAZolam (XANAX) 1 MG tablet  Patient is out has 1 refill but pharmacy says they cant fill it with dr taylors name his isnt authroized  [facility] [place]

## 2016-06-21 NOTE — Telephone Encounter (Signed)
Attempted to contact pharmacy, but they have already spoken with someone from our office.Regenia Skeeter, Kennley Schwandt Cassady1/16/201811:20 AM

## 2016-06-22 ENCOUNTER — Other Ambulatory Visit: Payer: Self-pay | Admitting: Internal Medicine

## 2016-06-22 DIAGNOSIS — G4700 Insomnia, unspecified: Secondary | ICD-10-CM

## 2016-06-22 MED ORDER — ALPRAZOLAM 1 MG PO TABS: 1.0000 mg | ORAL_TABLET | Freq: Every day | ORAL | 0 refills | Status: DC

## 2016-06-22 NOTE — Progress Notes (Signed)
Called by patient on my personal pager regarding her sleeping medication (xanax).  She is a patient of Dr. Lovena Le and takes xanax 1mg  at night for sleep.  She notes being out of the medication and the pharmacy is telling her that Dr. Lovena Le cannot fill this medication.  This sometimes happens with resident [facility] numbers.  Since the clinic is closed today, I will call in the medication for her to pick up today.  Last refill was a 90 pill supply, presumably for 3 months.  I reviewed Dr. Tanna Furry last note which reported that he agreed with continuing xanax for the indication of insomnia.  Reviewed Narcotic database and our clinic took over prescribing this medication is appears in July of 2017.  I think it is appropriate to refill at this time.  Will route message to Dr. Lovena Le.  Called in to [facility] in [place].   Gilles Chiquito, MD

## 2016-09-12 ENCOUNTER — Other Ambulatory Visit: Payer: Self-pay | Admitting: Internal Medicine

## 2016-09-12 DIAGNOSIS — G4700 Insomnia, unspecified: Secondary | ICD-10-CM

## 2016-09-12 NOTE — Telephone Encounter (Signed)
I want this to be refilled. She needs this medication. It says it can't be e-prescribed.  How can I get this to her? I am currently on nights. I don't want her to run out of this.

## 2016-09-13 ENCOUNTER — Telehealth: Payer: Self-pay | Admitting: Internal Medicine

## 2016-09-13 NOTE — Telephone Encounter (Signed)
Dr. Lovena Le, when you approve it select "phone in" under "order class" then we can call it in to pharmacy. Thanks!

## 2016-09-13 NOTE — Telephone Encounter (Signed)
I have refilled her Xanax.

## 2016-09-13 NOTE — Telephone Encounter (Signed)
I refilled her Xanax. She takes this medication regularly and has no red flags to continue with her current regimen.

## 2016-09-14 ENCOUNTER — Telehealth: Payer: Self-pay | Admitting: *Deleted

## 2016-09-14 ENCOUNTER — Telehealth: Payer: Self-pay

## 2016-09-14 NOTE — Telephone Encounter (Signed)
ALPRAZolam (XANAX) 1 MG tablet, refill request @ [facility] in [place].

## 2016-09-14 NOTE — Telephone Encounter (Signed)
This has been called to pharm this am, please see note

## 2016-09-14 NOTE — Telephone Encounter (Signed)
Called xanax to pharmacy

## 2016-10-27 ENCOUNTER — Other Ambulatory Visit: Payer: Self-pay | Admitting: Internal Medicine

## 2016-10-27 ENCOUNTER — Encounter: Payer: Medicare Other | Admitting: Internal Medicine

## 2016-11-07 ENCOUNTER — Encounter: Payer: Self-pay | Admitting: Internal Medicine

## 2016-11-07 ENCOUNTER — Ambulatory Visit (INDEPENDENT_AMBULATORY_CARE_PROVIDER_SITE_OTHER): Payer: Medicare Other | Admitting: Internal Medicine

## 2016-11-07 VITALS — BP 162/65 | HR 85 | Temp 97.7°F | Ht 62.5 in | Wt 212.4 lb

## 2016-11-07 DIAGNOSIS — M19041 Primary osteoarthritis, right hand: Secondary | ICD-10-CM | POA: Diagnosis not present

## 2016-11-07 DIAGNOSIS — M19042 Primary osteoarthritis, left hand: Secondary | ICD-10-CM | POA: Diagnosis not present

## 2016-11-07 DIAGNOSIS — F5104 Psychophysiologic insomnia: Secondary | ICD-10-CM

## 2016-11-07 DIAGNOSIS — I10 Essential (primary) hypertension: Secondary | ICD-10-CM | POA: Diagnosis not present

## 2016-11-07 DIAGNOSIS — F341 Dysthymic disorder: Secondary | ICD-10-CM

## 2016-11-07 DIAGNOSIS — R911 Solitary pulmonary nodule: Secondary | ICD-10-CM | POA: Diagnosis not present

## 2016-11-07 DIAGNOSIS — Z0000 Encounter for general adult medical examination without abnormal findings: Secondary | ICD-10-CM

## 2016-11-07 DIAGNOSIS — F418 Other specified anxiety disorders: Secondary | ICD-10-CM

## 2016-11-07 DIAGNOSIS — G4700 Insomnia, unspecified: Secondary | ICD-10-CM

## 2016-11-07 MED ORDER — ALPRAZOLAM 1 MG PO TABS: 1.0000 mg | ORAL_TABLET | Freq: Every evening | ORAL | 0 refills | Status: DC | PRN

## 2016-11-07 MED ORDER — AMLODIPINE BESYLATE 5 MG PO TABS: 5.0000 mg | ORAL_TABLET | Freq: Every day | ORAL | 1 refills | Status: DC

## 2016-11-07 MED ORDER — OMEPRAZOLE 20 MG PO CPDR: 20.0000 mg | DELAYED_RELEASE_CAPSULE | Freq: Every day | ORAL | 2 refills | Status: DC

## 2016-11-07 NOTE — Assessment & Plan Note (Signed)
The patient has a history of solitary pulmonary nodule. Based on her most recent CT this is a stable 8 mm right upper lobe groundglass density. Based on current guidelines for the management of incidental pulmonary nodules detected on CT imaging the patient will need to repeat CT scan in 2019. -- Repeat low dose computed tomography screening examination in 2019

## 2016-11-07 NOTE — Addendum Note (Signed)
Addended by: Orson Gear on: 11/07/2016 02:46 PM   Modules accepted: Orders
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
## 2016-11-07 NOTE — Assessment & Plan Note (Addendum)
The patient has a diagnosis of primary osteoarthritis of both hands. Her arthritic pain is worsening. It doesn't appear that she's been worked up for rheumatoid arthritis in the past. Her pain is predominantly in the MCP and PIP joints of the hands bilaterally. It does seem to spare the DIP which would be consistent with a rheumatoid arthritis pattern. We will start workup for rheumatoid arthritis with rheumatoid factor and other labs. In the meantime for pain I advised that the patient take a brief sabbatical from nonsteroidal anti-inflammatory drugs and use Tylenol. I instructed her that she can take 1000 mg up to 3 times daily as needed for pain. She endorsed understanding of this plan. -- BMP -- Rheumatoid factor -- Acetaminophen 1000 mg 3 times a day when necessary  ADDENDUM 11/08/2016 RA factor negative

## 2016-11-07 NOTE — Assessment & Plan Note (Signed)
Patient overdue for colon cancer screening. She does not wish to try any screening option as she is not amenable to therapeutic intervention if pathology identified. -- Ongoing discussions about risks and benefits of colon cancer screening

## 2016-11-07 NOTE — Assessment & Plan Note (Signed)
The patient has insomnia which is chronic. She also has anxiety disorder. She takes 1 mg Xanax every night which helps with her anxiety and insomnia. She is taking this medication appropriately. We have been providing refills as I think she does well with this medication. As she ages there may need to be ongoing discussion about switching to a different class of medication. However, for now the patient is not amenable to trying to switch and she gets good relief from both her anxiety and insomnia with nightly alprazolam. -- Refill alprazolam -- 1 mg at night for sleep -- 3 month supply provided, return to clinic in 3 months for ongoing management

## 2016-11-07 NOTE — Assessment & Plan Note (Signed)
Currently stable. Anxiety treated with alprazolam. Depression not requiring pharmacological intervention at this time. Continue to monitor. -- Stable on alprazolam -- Continue to monitor

## 2016-11-07 NOTE — Assessment & Plan Note (Addendum)
The patient has a diagnosis of essential hypertension. Currently, she is not on any antihypertensive medication as she has been adequately controlling her blood pressure with diet and exercise. Blood pressure in clinic today as followed: Vitals:   11/07/16 1412  BP: (!) 162/65  Pulse: 85  Temp: 97.7 F (36.5 C)   Given that the patient has been on and off nonsteroidal anti-inflammatory drugs for the treatment of her arthritis I will start antihypertensive medication with amlodipine 5 mg once daily. She will return to clinic in 1 month for repeat evaluation of her blood pressure and basic metabolic panel. -- Amlodipine 5 mg once daily -- Return to clinic in 1 month for blood pressure management and repeat basic metabolic panel

## 2016-11-07 NOTE — Patient Instructions (Signed)
It was a pleasure seeing you today. Thank you for choosing [facility] for your healthcare needs.  I started a medicine for your blood pressure called amlodipine. Please take this once daily. Please return to clinic in 1 month for repeat blood work.  I will get blood work today. I will call you with the results.  I will also check you for rheumatoid arthritis.  For the hand pain you may take Tylenol 1000 mg twice daily.  Return to clinic in 1 month or sooner as needed

## 2016-11-08 LAB — BMP8+ANION GAP
ANION GAP: 14 mmol/L (ref 10.0–18.0)
BUN/Creatinine Ratio: 10 — ABNORMAL LOW (ref 12–28)
BUN: 6 mg/dL — ABNORMAL LOW (ref 8–27)
CHLORIDE: 103 mmol/L (ref 96–106)
CO2: 24 mmol/L (ref 18–29)
Calcium: 9.5 mg/dL (ref 8.7–10.3)
Creatinine, Ser: 0.63 mg/dL (ref 0.57–1.00)
GFR calc Af Amer: 105 mL/min/{1.73_m2} (ref >59)
GFR calc non Af Amer: 91 mL/min/{1.73_m2} (ref >59)
GLUCOSE: 86 mg/dL (ref 65–99)
POTASSIUM: 3.9 mmol/L (ref 3.5–5.2)
SODIUM: 141 mmol/L (ref 134–144)

## 2016-11-08 LAB — RHEUMATOID FACTOR

## 2016-11-08 NOTE — Addendum Note (Signed)
Addended by: Aldine Contes on: 11/08/2016 08:58 AM   Modules accepted: Level of Service

## 2016-11-08 NOTE — Progress Notes (Signed)
Internal Medicine Clinic Attending  Case discussed with Dr. Taylor at the time of the visit.  We reviewed the resident's history and exam and pertinent patient test results.  I agree with the assessment, diagnosis, and plan of care documented in the resident's note. 

## 2016-11-09 LAB — CYCLIC CITRUL PEPTIDE ANTIBODY, IGG/IGA: CYCLIC CITRULLIN PEPTIDE AB: 4 U (ref 0–19)

## 2016-11-14 ENCOUNTER — Encounter: Payer: Self-pay | Admitting: *Deleted

## 2016-11-30 ENCOUNTER — Ambulatory Visit (INDEPENDENT_AMBULATORY_CARE_PROVIDER_SITE_OTHER): Payer: Medicare Other

## 2016-11-30 ENCOUNTER — Ambulatory Visit (INDEPENDENT_AMBULATORY_CARE_PROVIDER_SITE_OTHER): Payer: Medicare Other | Admitting: Physician Assistant

## 2016-11-30 ENCOUNTER — Encounter (INDEPENDENT_AMBULATORY_CARE_PROVIDER_SITE_OTHER): Payer: Self-pay | Admitting: Physician Assistant

## 2016-11-30 DIAGNOSIS — Z96652 Presence of left artificial knee joint: Secondary | ICD-10-CM | POA: Diagnosis not present

## 2016-11-30 DIAGNOSIS — Z96641 Presence of right artificial hip joint: Secondary | ICD-10-CM

## 2016-11-30 MED ORDER — NAPROXEN 500 MG PO TABS: 500.0000 mg | ORAL_TABLET | Freq: Two times a day (BID) | ORAL | 1 refills | Status: DC

## 2016-11-30 MED ORDER — TRAMADOL HCL 50 MG PO TABS: 50.0000 mg | ORAL_TABLET | Freq: Four times a day (QID) | ORAL | 1 refills | Status: DC | PRN

## 2016-11-30 NOTE — Progress Notes (Signed)
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 She states that her hip is doing very well she has no real pain with it. Main complaint today is hand pain and right knee pain. She's had a right total knee arthroplasty in the past by Dr. Ninfa Linden. States the knee sometimes gives way on her. Otherwise denies any fevers chills shortness.  Physical exam: Right hip excellent range of motion without pain. Right well until surgical incision no signs of infection. Quad atrophy compared to the left knee. Overall good range of motion in the left knee without pain. No instability with valgus varus stressing. No effusion abnormal warmth erythema  Radiographs: AP pelvis and AP pelvis frog-leg shows no hardware failure. Right hip is well located. No acute fracture.   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 In regards to her hands and knee pain place her on naproxen. We did give her some tramadol for pain. We'll see her back on when necessary basis or she has any questions or concerns.

## 2016-12-16 IMAGING — CR DG HIP (WITH OR WITHOUT PELVIS) 1V PORT*R*
2 series · 2 of 2 positions shown · non-contrast
Comparison: None.

CLINICAL DATA: Postop imaging following right hip arthroplasty.

EXAM:
DG HIP (WITH OR WITHOUT PELVIS) 1V PORT RIGHT

[AP (1 of 2)]
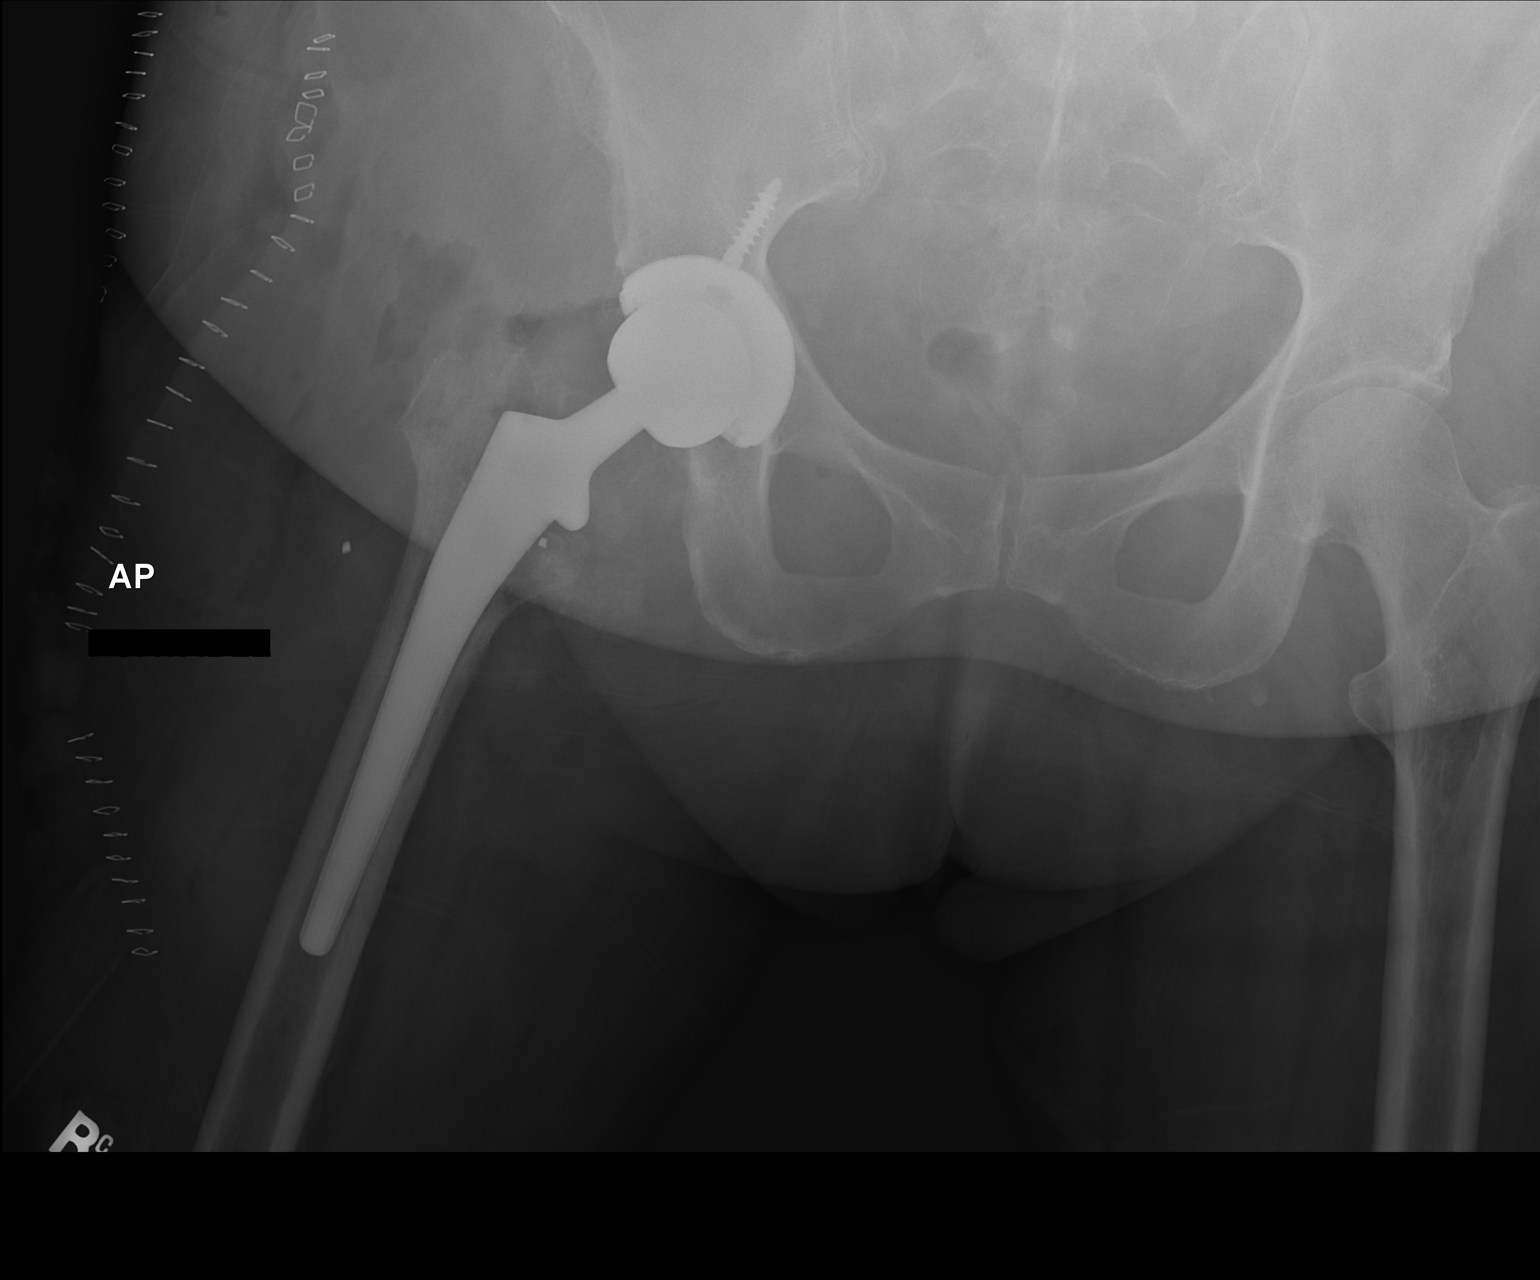

[AP (2 of 2)]
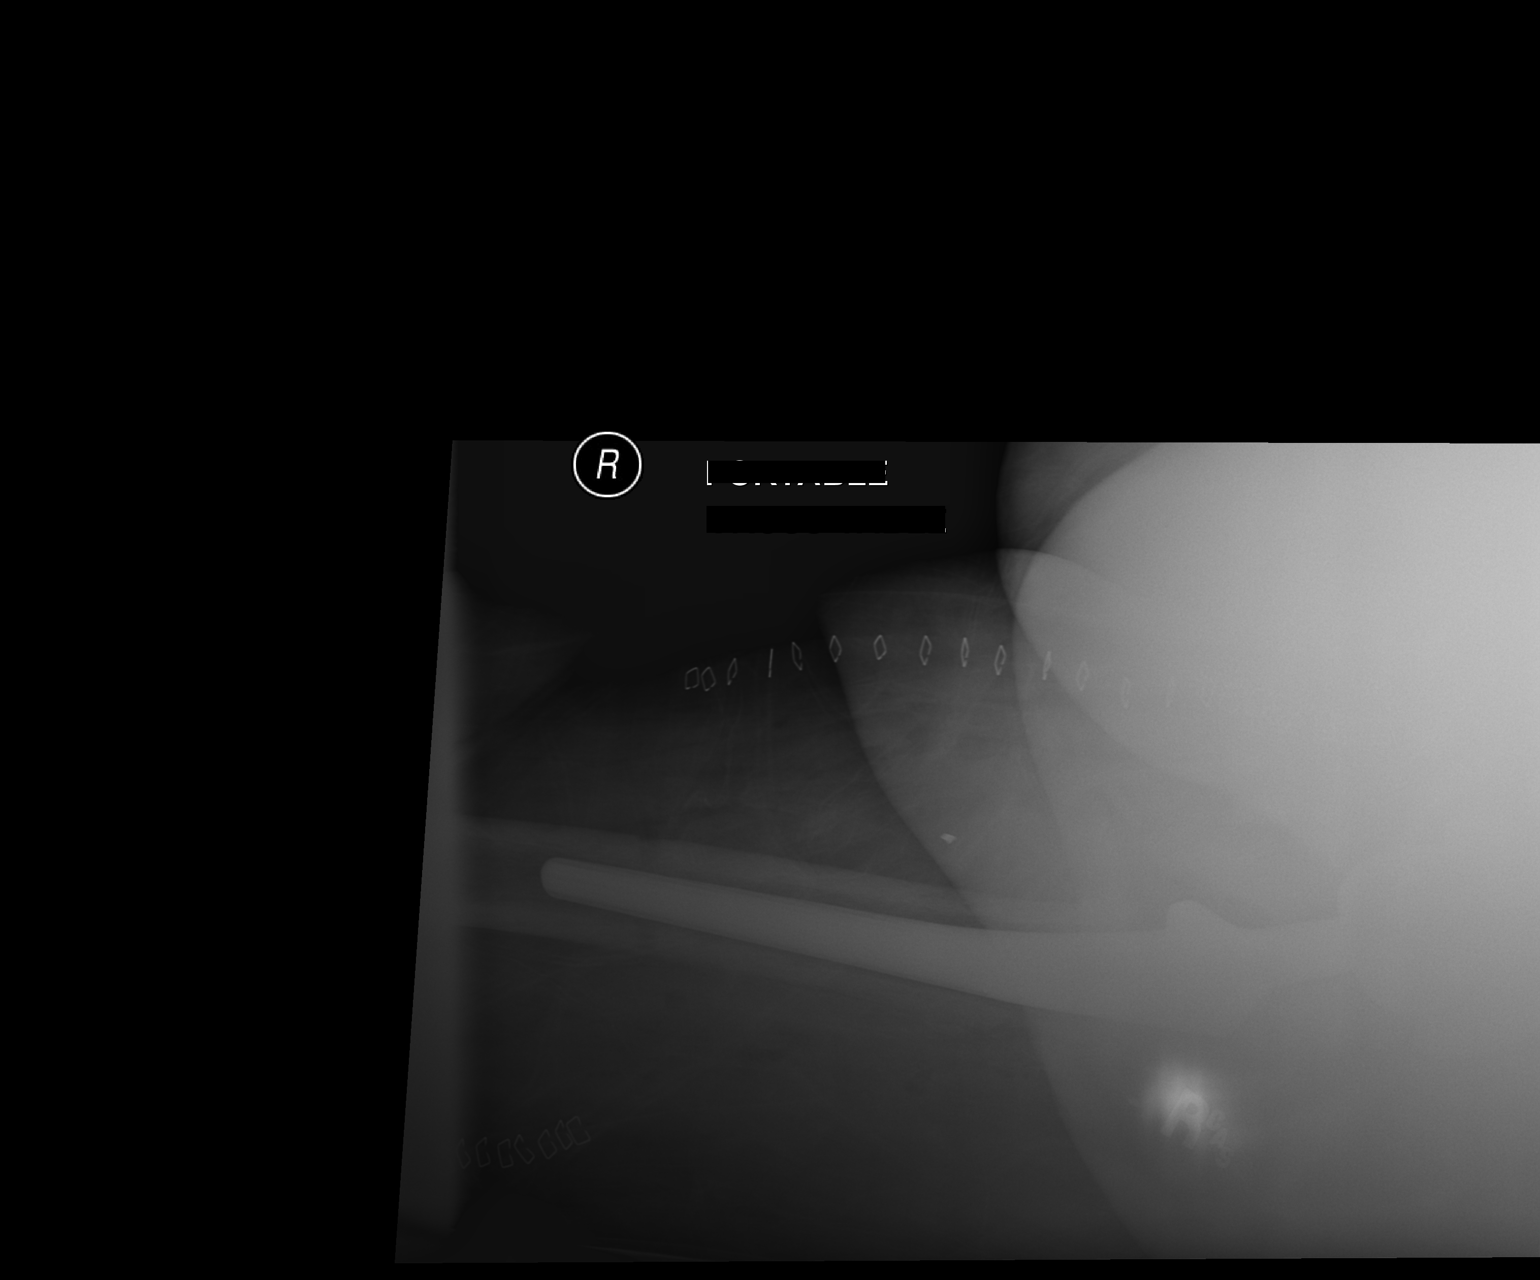

[2 of 2 positions shown; findings below may reference images not displayed]

FINDINGS: New right hip total arthroplasty is well-seated and aligned. 2 small
metal bodies lie adjacent to the proximal femur consistent with
small metal fragments from the previous hardware.

There is no acute fracture or evidence of an operative complication.
IMPRESSION: Well-aligned total right hip arthroplasty.

## 2016-12-16 IMAGING — RF DG HIP (WITH PELVIS) OPERATIVE*R*
1 series · 5 of 5 positions shown · non-contrast
Comparison: 07/14/2015

CLINICAL DATA: Right sided trochanteric nail removal and conversion
to right anterior total hip replacement Fluoro time was 2 minutes
and 5 seconds

EXAM:
OPERATIVE RIGHT HIP (WITH PELVIS IF PERFORMED) 5 VIEWS
TECHNIQUE: Fluoroscopic spot image(s) were submitted for interpretation
post-operatively.

[Series 1: run · 5 of 5 slices shown]
[im 1/5]
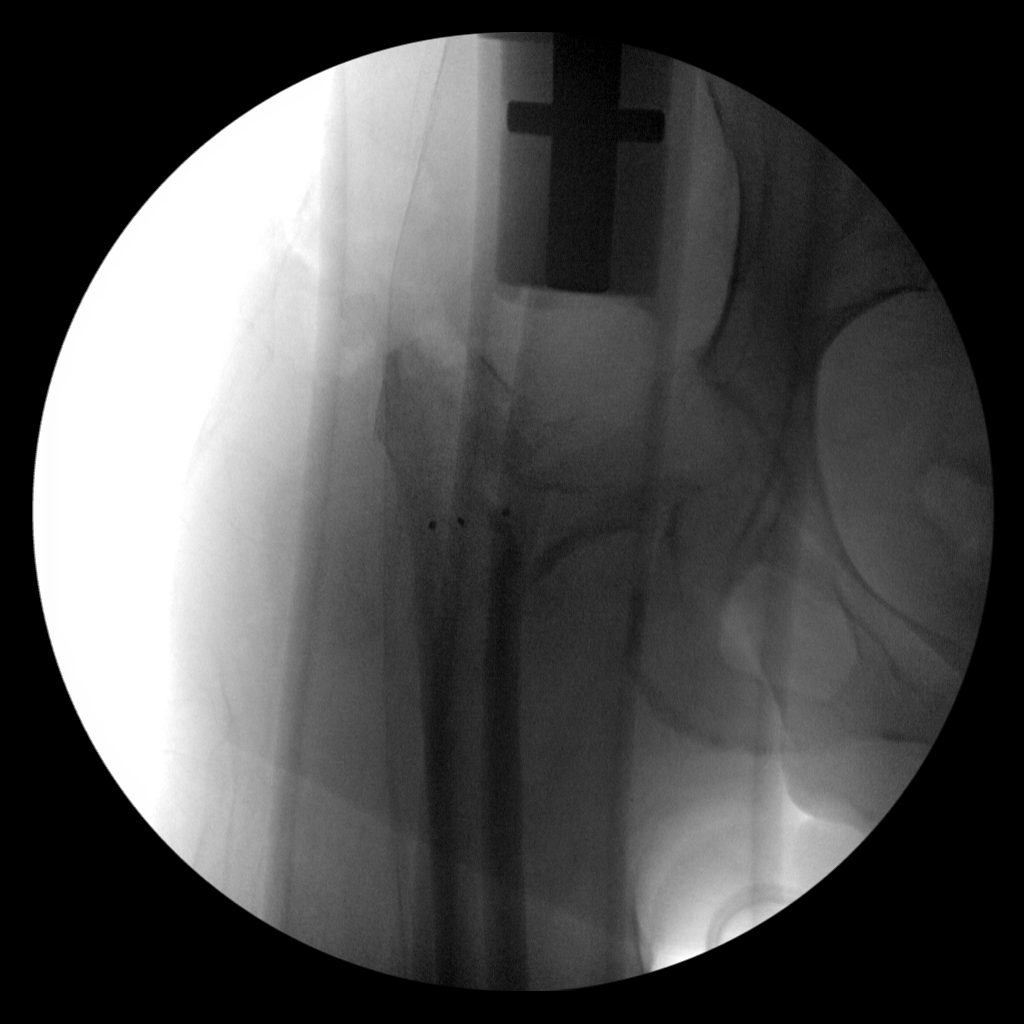
[im 2/5]
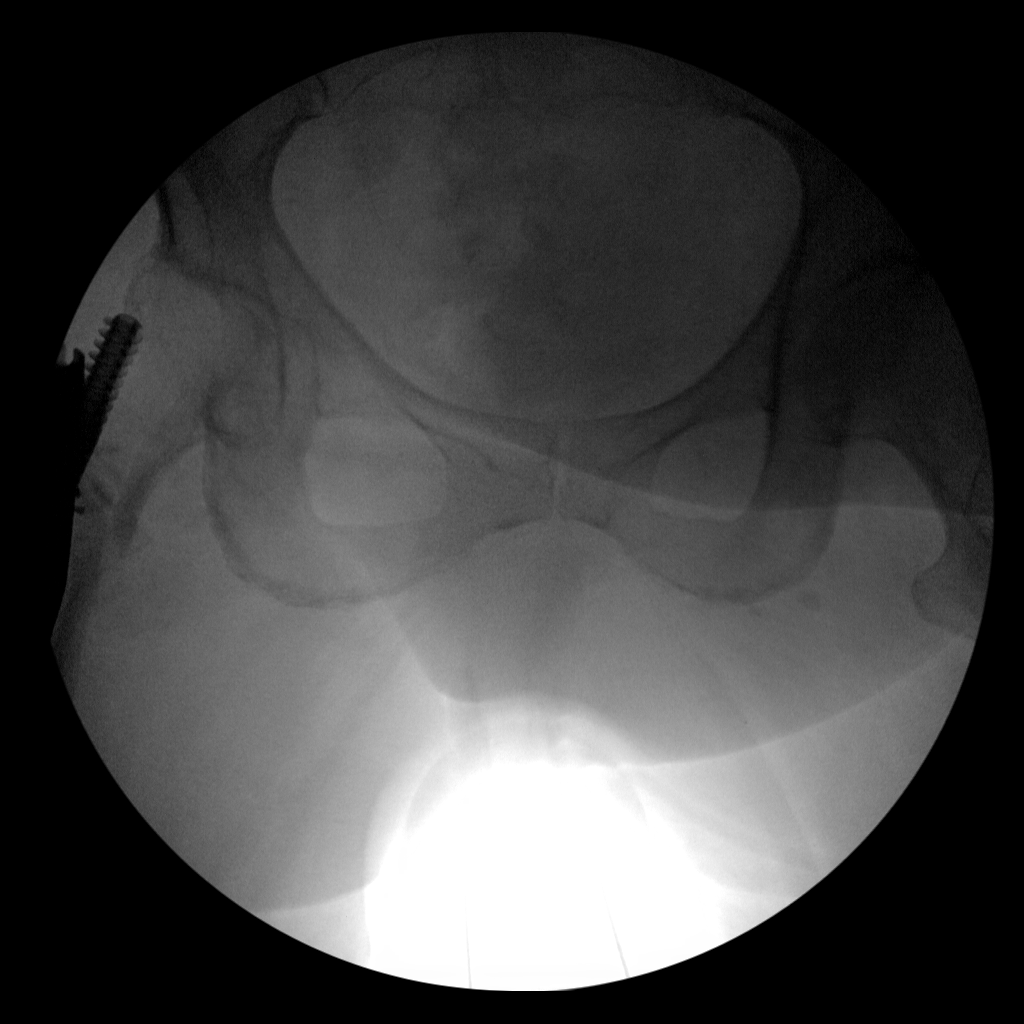
[im 3/5]
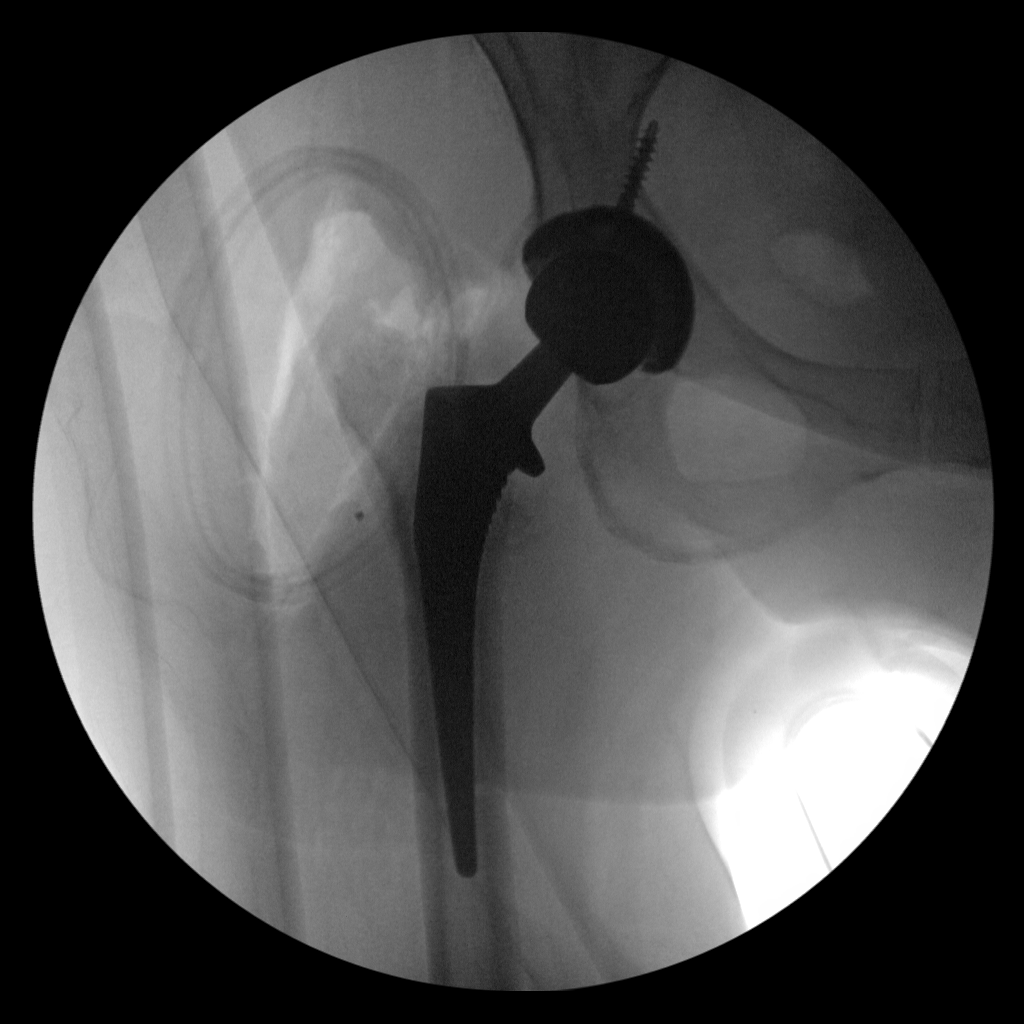
[im 4/5]
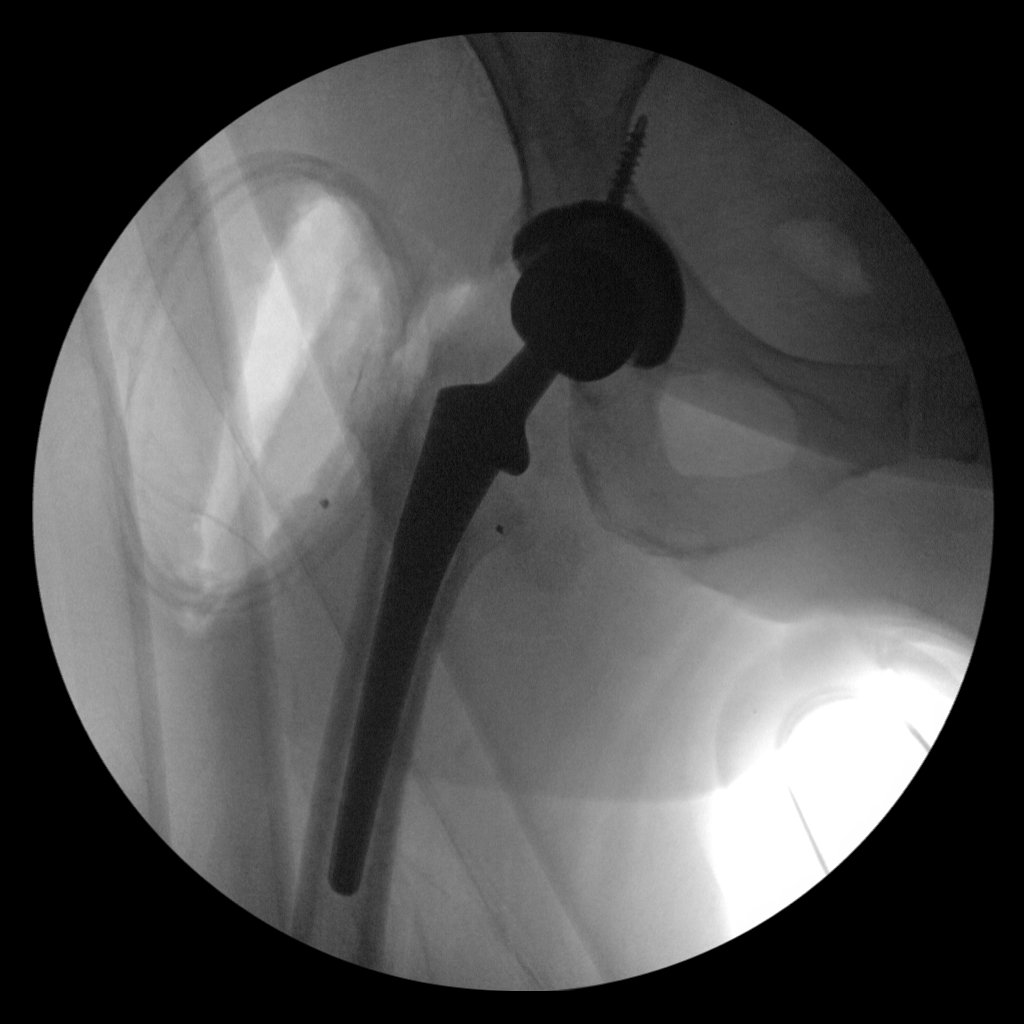
[im 5/5]
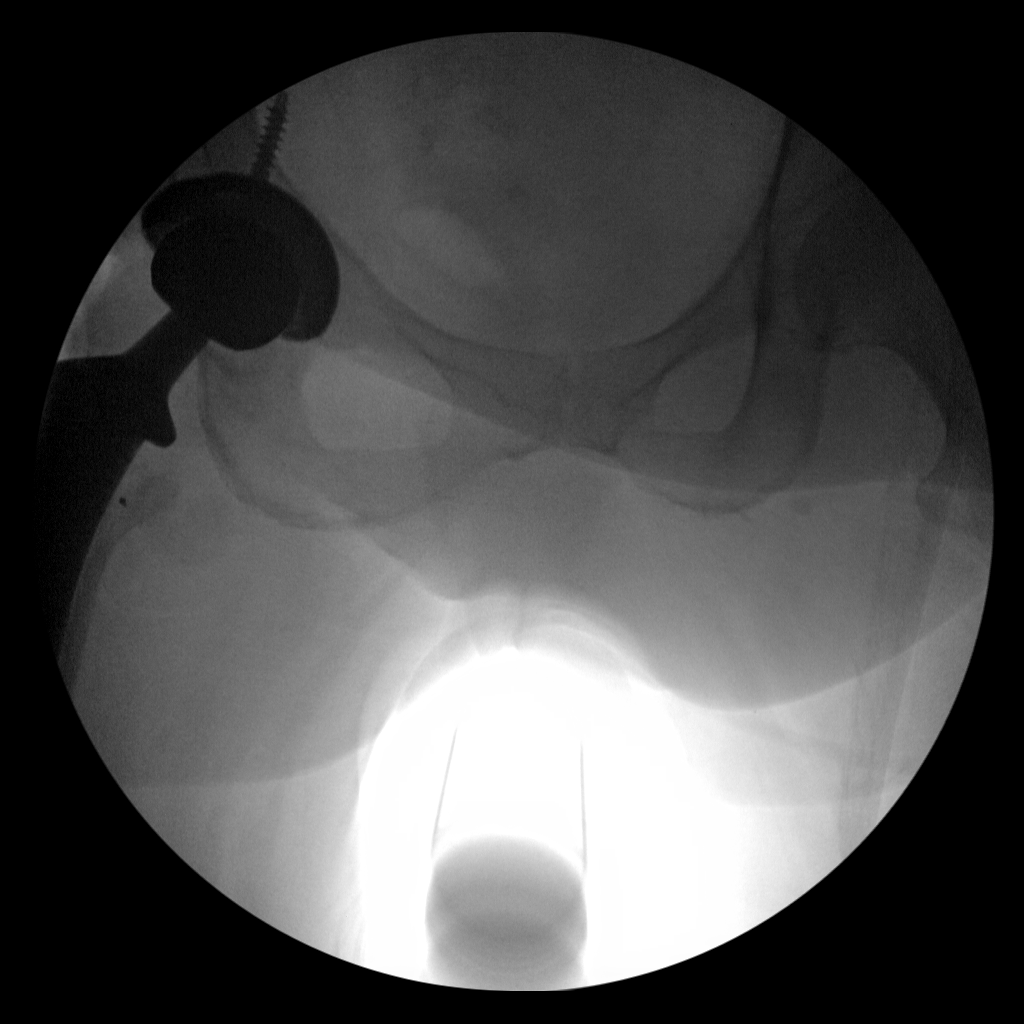

[5 of 5 positions shown; findings below may reference images not displayed]

FINDINGS: The submitted images show removal of the compression screw an intra
medullary rod with placement of a total hip arthroplasty. The
femoral and acetabular components appear well seated and aligned on
the submitted views. No evidence of an operative complication.
IMPRESSION: Well-aligned right hip total arthroplasty.

## 2017-01-13 ENCOUNTER — Encounter: Payer: Self-pay | Admitting: Family Medicine

## 2017-01-13 ENCOUNTER — Ambulatory Visit (INDEPENDENT_AMBULATORY_CARE_PROVIDER_SITE_OTHER): Payer: Medicare Other | Admitting: Family Medicine

## 2017-01-13 VITALS — BP 140/64 | Ht 63.0 in | Wt 210.0 lb

## 2017-01-13 DIAGNOSIS — M75102 Unspecified rotator cuff tear or rupture of left shoulder, not specified as traumatic: Secondary | ICD-10-CM

## 2017-01-13 MED ORDER — IBUPROFEN 600 MG PO TABS: 600.0000 mg | ORAL_TABLET | Freq: Three times a day (TID) | ORAL | 2 refills | Status: DC | PRN

## 2017-01-13 MED ORDER — METHYLPREDNISOLONE ACETATE 40 MG/ML IJ SUSP
40.0000 mg | Freq: Once | INTRAMUSCULAR | Status: AC
  Administered 2017-01-13: 40 mg via INTRA_ARTICULAR

## 2017-01-13 NOTE — Progress Notes (Signed)
   Subjective:    Lori Bradford - 71 y.o. female MRN [id number]  Date of birth: 12/30/1945  CC: left shoulder and neck pain    HPI  Lori Bradford is here for left shoulder and neck pain. Has been present for two months. Located over the lateral aspect of the shoulder radiating up into the left side of the neck. Was initially only present with movements overhead but is now present at rest. ROM is limited. She has been trying Naproxen without significant relief.    - Review of Systems: Per HPI.   reports that she has never smoked. She has never used smokeless tobacco. - Past Medical History: Patient Active Problem List   Diagnosis Date Noted  . Solitary pulmonary nodule 08/19/2015  . Deep vein thrombosis (DVT) of left lower extremity ([place]) 08/19/2015  . Painful orthopaedic hardware and failed fixation of previous right hip fracture 08/04/2015  . 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 Primary osteoarthritis of both hands 11/22/2014  . Skin lesion 07/03/2014  . Status post total bilateral knee replacement 04/04/2014  . Rotator cuff syndrome of both shoulders 12/16/2013  . Osteopenia   . Obesity 09/10/2012  . Normocytic anemia 12/29/2011  . Preventative health care 09/06/2011  . INSOMNIA, CHRONIC, MILD 06/07/2010  . ANXIETY DEPRESSION 09/07/2006  . Essential hypertension 07/03/2006      Objective:   Physical Exam Blood pressure 140/64, height 5\' 3"  (1.6 m), weight 210 lb (95.3 kg). Gen: NAD, alert, cooperative with exam, well-appearing Shoulder: No erythema, increased warmth, or edema of left shoulder. Left shoulder with limited ROM with forward flexion and internal rotation. Abduction of left shoulder intact but painful above 90 degrees. Strength of left shoulder intact. Positive impingement sign on left.   INJECTION: Patient was given informed consent, signed copy in the chart. Appropriate time out was taken. Area prepped in usual sterile fashion. 1 cc of  methylprednisolone 40 mg/ml plus  4 cc of 1% lidocaine without epinephrine was injected into the left subacromial space using a posterior approach. The patient tolerated the procedure well. There were no complications. Post procedure instructions were given.     Assessment & Plan:   1. Rotator cuff syndrome, left Steroid injection performed today. NSAIDs prn. Continue with gentle ROM exercises.    Phill Myron, D.O. 01/13/2017, 10:40 AM PGY-3, [place]

## 2017-01-13 NOTE — Patient Instructions (Signed)
Today you received an injection with corticosteroid. This injection is usually done in response to pain and inflammation. There is some "numbing" medicine also in the shot so the injected area may be numb and feel really good for the next couple of hours. The numbing medicine usually wears off in 2-3 hours though, and then your pain level will be right back where it was before the injection.   The actually benefit from the steroid injection is usually noticed in 2-7 days. You may actually experience a small (as in 10%) INCREASE in pain in the first 24 hours---that is common.   Things to watch out for that you should contact us or a health care provider urgently would include: 1. Unusual (as in more than 10%) increase in pain 2. New fever > 101.5 3. New swelling or redness of the injected area.  4. Streaking of red lines around the area injected.  

## 2017-01-16 NOTE — Progress Notes (Signed)
[facility] Attending Note: I have seen and examined this patient. I have discussed this patient with the resident and reviewed the assessment and plan as documented above. I agree with the resident's findings and plan.  

## 2017-02-08 ENCOUNTER — Other Ambulatory Visit: Payer: Self-pay | Admitting: Internal Medicine

## 2017-02-08 NOTE — Telephone Encounter (Signed)
Patient would like a call back in about her BP medications

## 2017-02-08 NOTE — Telephone Encounter (Signed)
Pt requesting appt to be see for her htn.  She was started on amlodipine last visit-states she is no longer taking them because it "makes me feel bad" c/o dry mouth, decreased energy.  Pt  initially stopped taking amlodipine and went back to taking her old rx hctz-lisinopril until they ran out.  She attempted to try the amlodipine again, but had to stop taking.  The last rx taken was the amlodine about 1-2wks ago.  Pt has an appt with her pcp on 10/18-but is requesting a sooner appt. Pt will be seen on Monday Sept 10th @9 :45pm in [facility] to address htn only.Despina Hidden Cassady9/5/20183:39 PM

## 2017-02-09 ENCOUNTER — Encounter: Payer: Medicare Other | Admitting: Internal Medicine

## 2017-02-13 ENCOUNTER — Ambulatory Visit (INDEPENDENT_AMBULATORY_CARE_PROVIDER_SITE_OTHER): Payer: Medicare Other | Admitting: Internal Medicine

## 2017-02-13 ENCOUNTER — Encounter: Payer: Self-pay | Admitting: Internal Medicine

## 2017-02-13 VITALS — BP 157/56 | HR 67 | Temp 97.9°F | Ht 63.0 in | Wt 219.3 lb

## 2017-02-13 DIAGNOSIS — G4700 Insomnia, unspecified: Secondary | ICD-10-CM

## 2017-02-13 DIAGNOSIS — Z79899 Other long term (current) drug therapy: Secondary | ICD-10-CM

## 2017-02-13 DIAGNOSIS — I10 Essential (primary) hypertension: Secondary | ICD-10-CM

## 2017-02-13 MED ORDER — LISINOPRIL 20 MG PO TABS
20.0000 mg | ORAL_TABLET | Freq: Every day | ORAL | 2 refills | Status: DC
Start: 2017-02-13 — End: 2017-02-13

## 2017-02-13 MED ORDER — ALPRAZOLAM 1 MG PO TABS: 1.0000 mg | ORAL_TABLET | Freq: Every evening | ORAL | 0 refills | Status: DC | PRN

## 2017-02-13 MED ORDER — LISINOPRIL 20 MG PO TABS: 20.0000 mg | ORAL_TABLET | Freq: Every day | ORAL | 2 refills | Status: DC
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
## 2017-02-13 NOTE — Assessment & Plan Note (Addendum)
BP Readings from Last 3 Encounters:  02/13/17 (!) 157/56  01/13/17 140/64  11/07/16 (!) 162/65   Patient is here for follow up of hypertension. She says that she is not taking amlodipine because it makes her feel weary, and she would like to go back to her old regimen. Per chart review, she was on lisinopril-hctz in the past for HTN, and the hctz portion was discontinued in Feb 2017 due to patient's admission for hyponatremia, and after that , she discontinued her lisinopril because she wanted to try lifestyle modification. At the prior visit, amlodipine was started. Patient says that she has not taken amlodipine for a month, and she had some leftover hctz-lisinopril tablets that she took until last week when she completely ran out of them. She says she periodically checks her blood pressures, and systolics have been in the 150s without any of her meds  Plan -started lisinopril 20 mg daily -advised her to check bp at home and bring some numbers to the clinic- after she takes this medicine. -pt to follow up next week for appt with PCP- and BP recheck and BMET to be done at that time.

## 2017-02-13 NOTE — Progress Notes (Signed)
[facility] Attending  Case discussed with Dr. Saraiya at the time of the visit.  We reviewed the resident's history and exam and pertinent patient test results.  I agree with the assessment, diagnosis, and plan of care documented in the resident's note.  

## 2017-02-13 NOTE — Assessment & Plan Note (Addendum)
Patient has chronic insomnia, and anxiety for which she takes xanax 1 mg daily at bedtime.  She says that with the xanax she is able to sleep 6-7 hours at night, but without this she can only sleep 2-3 hours. Review of [place] database shows appropriate dispensation. Patient has not had discussion about tapering in the past, and today did not want to discuss tapering as she wanted to see her PCP.   Given that she has been a long term user of xanax, we will give her 30 days supply, and ask her to follow up with her PCP.  Plan -refilled 30 day supply of xanax -follow up  At next scheduled appt, obtain UDS at that time.

## 2017-02-13 NOTE — Patient Instructions (Signed)
Thank you for your visit today  Please start taking lisinopril 20 mg daily for your blood pressure. Please stop the amlodipine  Please follow up next week for your appointment with your PCP

## 2017-03-23 ENCOUNTER — Encounter: Payer: Self-pay | Admitting: Internal Medicine

## 2017-03-23 ENCOUNTER — Ambulatory Visit (INDEPENDENT_AMBULATORY_CARE_PROVIDER_SITE_OTHER): Payer: Medicare Other | Admitting: Internal Medicine

## 2017-03-23 VITALS — BP 140/53 | HR 66 | Temp 98.2°F | Ht 63.0 in | Wt 219.4 lb

## 2017-03-23 DIAGNOSIS — Z23 Encounter for immunization: Secondary | ICD-10-CM | POA: Insufficient documentation

## 2017-03-23 DIAGNOSIS — G4700 Insomnia, unspecified: Secondary | ICD-10-CM

## 2017-03-23 DIAGNOSIS — I10 Essential (primary) hypertension: Secondary | ICD-10-CM

## 2017-03-23 DIAGNOSIS — R0781 Pleurodynia: Secondary | ICD-10-CM | POA: Diagnosis not present

## 2017-03-23 DIAGNOSIS — Z79899 Other long term (current) drug therapy: Secondary | ICD-10-CM

## 2017-03-23 HISTORY — DX: Encounter for immunization: Z23

## 2017-03-23 MED ORDER — ALPRAZOLAM 1 MG PO TABS: 1.0000 mg | ORAL_TABLET | Freq: Every evening | ORAL | 2 refills | Status: DC | PRN
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
## 2017-03-23 NOTE — Assessment & Plan Note (Addendum)
Patient is here for follow up of hypertension. At the prior visit, Linsinopril 20mg  Daily was started. Patient has been taking this medication as prescribed and has been tolerating it well. BP 140/53 today. BP at home has been in the 488Q-916X systolic's per patient. She does state that she feels she has had more swelling after discontinuing HCTZ. We may consider adding low dose loop diuretic on potentially an every other day basis if this continues to be a problem and her blood pressures can tolerate this, pending results of BMP (watching for repeat hyponatremia). - Continue lisinopril 20 mg daily - BMP today  ADDENDUM: 04/13/17 Patient contacted and informed about recent lab results. Patient requests that she be started on a fluid pill as discussed during her previous visit since her values remained normal. This is reasonable considering she has continued to experience lower extremity edema, which is causing her discomfort. - Lasix 20mg  every other day - Patient instructed to monitor her blood pressure for hypotension with the addition of this diuretic.

## 2017-03-23 NOTE — Patient Instructions (Addendum)
Thank you for allowing [place] to care for you.  Your blood pressure was imporved today! - Continue to take lisinopril 20mg  Daily - You had labs done today, we will call you with any abnormal results  For your insomnia, we have provided you with 3 months of your Xanax - Please considered trying a different medication in the future as you are more likely to experience side effects from this medication as you age.  Please return in 3 months for follow up.

## 2017-03-23 NOTE — Assessment & Plan Note (Addendum)
The patient has chronic insomnia. She additionally has an anxiety disorder. She takes 1 mg Xanax every night which helps with her anxiety and insomnia. She is taking this medication appropriately. We have been providing refills as she has done well with this medication. As she continues to age will need to continue to discuss switching to a different class of medication. However, for now the patient is not amenable to trying to switch and she gets good relief from both her anxiety and insomnia with nightly alprazolam and denies side effects (falls, drowsiness, confusion). - Refill alprazolam 1mg  qhs, for sleep - 3 month supply provided, return to clinic in 3 months for ongoing management

## 2017-03-24 LAB — BMP8+ANION GAP
Anion Gap: 17 mmol/L (ref 10.0–18.0)
BUN / CREAT RATIO: 17 (ref 12–28)
BUN: 10 mg/dL (ref 8–27)
CO2: 21 mmol/L (ref 20–29)
CREATININE: 0.58 mg/dL (ref 0.57–1.00)
Calcium: 9.3 mg/dL (ref 8.7–10.3)
Chloride: 103 mmol/L (ref 96–106)
GFR, EST AFRICAN AMERICAN: 108 mL/min/{1.73_m2} (ref >59)
GFR, EST NON AFRICAN AMERICAN: 94 mL/min/{1.73_m2} (ref >59)
Glucose: 101 mg/dL — ABNORMAL HIGH (ref 65–99)
Potassium: 4.4 mmol/L (ref 3.5–5.2)
SODIUM: 141 mmol/L (ref 134–144)

## 2017-03-24 NOTE — Progress Notes (Signed)
[facility] Attending  I saw and evaluated the patient.  I personally confirmed the key portions of the history and exam documented by Dr. Trilby Drummer and I reviewed pertinent patient test results.  The assessment, diagnosis, and plan were formulated together and I agree with the documentation in the resident's note.  BP initially elevated, but repeat better. Per her report, home readings excellent at 292T-090B systolic. Will continue for now and continue to follow. Agree with Dr. Tally Joe concern about xanax in her qHS for sleep, but will defer further conversation until better rapport established.   Oda Kilts, MD

## 2017-04-11 ENCOUNTER — Telehealth: Payer: Self-pay

## 2017-04-11 ENCOUNTER — Other Ambulatory Visit: Payer: Self-pay | Admitting: Internal Medicine

## 2017-04-11 DIAGNOSIS — I10 Essential (primary) hypertension: Secondary | ICD-10-CM

## 2017-04-11 DIAGNOSIS — G4700 Insomnia, unspecified: Secondary | ICD-10-CM

## 2017-04-11 NOTE — Telephone Encounter (Signed)
Requesting lab results. Please call pt back.  

## 2017-04-12 NOTE — Telephone Encounter (Signed)
Pt was seen in clinic 10/18, visit note states 3 printed scripts given, called pharm, they do not have any on file for pt, attempted to call pt for info, lm for rtc

## 2017-04-13 MED ORDER — FUROSEMIDE 20 MG PO TABS: 20.0000 mg | ORAL_TABLET | ORAL | 11 refills | Status: DC

## 2017-04-13 NOTE — Telephone Encounter (Signed)
Patient contacted and informed about recent lab results. Patient requests that she be started on a fluid pill as discussed during her previous visit since her values remained normal. This is reasonable considering she has continued to experience lower extremity edema, which is causing her discomfort. - Lasix 20mg  every other day - Patient instructed to monitor her blood pressure for hypotension with the addition of this diuretic.

## 2017-04-14 ENCOUNTER — Other Ambulatory Visit: Payer: Self-pay | Admitting: Internal Medicine

## 2017-04-14 NOTE — Telephone Encounter (Signed)
Patient needs

## 2017-04-14 NOTE — Telephone Encounter (Signed)
Called [facility] once again and gave verbal for alprazolam, ask them to destroy any paper scripts brought in for this med

## 2017-04-14 NOTE — Telephone Encounter (Signed)
Patient requesting refills sleep medicine

## 2017-05-10 ENCOUNTER — Other Ambulatory Visit: Payer: Self-pay | Admitting: *Deleted

## 2017-05-11 ENCOUNTER — Other Ambulatory Visit: Payer: Self-pay | Admitting: *Deleted

## 2017-05-12 ENCOUNTER — Other Ambulatory Visit: Payer: Self-pay | Admitting: *Deleted

## 2017-05-12 MED ORDER — LISINOPRIL 20 MG PO TABS: 20.0000 mg | ORAL_TABLET | Freq: Every day | ORAL | 2 refills | Status: DC

## 2017-05-12 NOTE — Telephone Encounter (Signed)
Next appt scheduled  06/22/17 with PCP.

## 2017-06-10 ENCOUNTER — Other Ambulatory Visit (INDEPENDENT_AMBULATORY_CARE_PROVIDER_SITE_OTHER): Payer: Self-pay | Admitting: Physician Assistant

## 2017-06-22 ENCOUNTER — Encounter: Payer: Medicare Other | Admitting: Internal Medicine

## 2017-07-14 ENCOUNTER — Other Ambulatory Visit: Payer: Self-pay | Admitting: *Deleted

## 2017-07-14 DIAGNOSIS — G4700 Insomnia, unspecified: Secondary | ICD-10-CM

## 2017-07-14 MED ORDER — ALPRAZOLAM 1 MG PO TABS: 1.0000 mg | ORAL_TABLET | Freq: Every evening | ORAL | 0 refills | Status: DC | PRN

## 2017-07-14 NOTE — Telephone Encounter (Signed)
Refill approved for 1 refill only

## 2017-07-25 ENCOUNTER — Encounter: Payer: Self-pay | Admitting: Internal Medicine

## 2017-07-25 NOTE — Assessment & Plan Note (Addendum)
She takes 1 mg Xanax every night which helps with her anxiety and insomnia. She continues to take this medication appropriately. We have been providing refills as she continues to do well with it and has not been experiencing side effects.  As she continues to age are having ongoing discussions about switching to a different class of medication. Patient is not amenable to switching at this time and gets good relief of her anxiety and insomnia and denies side effects (falls, drowsiness, confusion). - Refill Alprazolam 1mg  qhs, for sleep - 3 month supply provided - Return to clinic in 3 months for ongoing management

## 2017-07-25 NOTE — Assessment & Plan Note (Addendum)
BP today 151/49 (no repeat BP obtained this visit). BP at last visit 140/53. Her BP is elevated today though it was her intial BP, which is likely not representative. She states she has been under more stress while  helping her ex-husband through his cancer treatments and living with him as he can be harsh per the patient (states his personality changed after a medical event where he suffered cardiac arrest).   Lasix was added last visit due to increased LE swelling after discontinuation of HCTZ. She has tolerated this medication well. Swelling resolved on exam today. Patient agreed to discuss potential medication changes (Increaseing Lisinopril dose) at next visit it her BP remains elevated. Patient provided with 90 day supply of medications and is to follow up in 3 months. - Lisinopril 20mg  Daily - Lasix 20mg  QoD

## 2017-07-27 ENCOUNTER — Encounter: Payer: Self-pay | Admitting: Internal Medicine

## 2017-07-27 ENCOUNTER — Ambulatory Visit (INDEPENDENT_AMBULATORY_CARE_PROVIDER_SITE_OTHER): Payer: Medicare Other | Admitting: Internal Medicine

## 2017-07-27 VITALS — BP 145/54 | HR 69 | Temp 97.5°F | Wt 219.4 lb

## 2017-07-27 DIAGNOSIS — I10 Essential (primary) hypertension: Secondary | ICD-10-CM

## 2017-07-27 DIAGNOSIS — F418 Other specified anxiety disorders: Secondary | ICD-10-CM

## 2017-07-27 DIAGNOSIS — Z0000 Encounter for general adult medical examination without abnormal findings: Secondary | ICD-10-CM

## 2017-07-27 DIAGNOSIS — G4700 Insomnia, unspecified: Secondary | ICD-10-CM | POA: Diagnosis not present

## 2017-07-27 DIAGNOSIS — Z79899 Other long term (current) drug therapy: Secondary | ICD-10-CM | POA: Diagnosis not present

## 2017-07-27 DIAGNOSIS — R200 Anesthesia of skin: Secondary | ICD-10-CM | POA: Diagnosis not present

## 2017-07-27 DIAGNOSIS — R202 Paresthesia of skin: Secondary | ICD-10-CM | POA: Diagnosis not present

## 2017-07-27 DIAGNOSIS — F341 Dysthymic disorder: Secondary | ICD-10-CM

## 2017-07-27 MED ORDER — ALPRAZOLAM 1 MG PO TABS: 1.0000 mg | ORAL_TABLET | Freq: Every evening | ORAL | 0 refills | Status: DC | PRN

## 2017-07-27 MED ORDER — FUROSEMIDE 20 MG PO TABS: 20.0000 mg | ORAL_TABLET | ORAL | 0 refills | Status: DC

## 2017-07-27 MED ORDER — LISINOPRIL 20 MG PO TABS: 20.0000 mg | ORAL_TABLET | Freq: Every day | ORAL | 0 refills | Status: DC
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
## 2017-07-27 NOTE — Assessment & Plan Note (Signed)
Patient provided with referral for screening mammography. She was also provided Immunochemical Fecal Occult card for colon cancer screening and had labs drawn for Hepatitis C screen.

## 2017-07-27 NOTE — Patient Instructions (Addendum)
Thank you for allowing [place] to care for you  For your High Blood pressure: - Your blood pressure was elevated today, we will recheck this at your next visit and make medication changes if needed then - Your Lisinopril and Lasix were refilled  For your Insomnia and Anxiety - You Xanax was refilled today - We will continue to discuss side effects of this medication and when to change to a different medication at your next visit  You have been provided a referral for a screening mammogram  You have provided a fecal occult card for colon cancer screening  You had blood drawn for a hepatitis C screen today, you will be contacted with the results.   Please return in about 3 months for a follow up.

## 2017-07-27 NOTE — Assessment & Plan Note (Addendum)
History of Anxiety and Depression. Anxiety currently affects her the most when trying to sleep. Along with her insomnia this has been treated with alprazolam. Depression remains stable off medication. Ongoing evaluations for alprazolam side effects given patient age. No side effects at this time.  Paitent under increaed stress due to her helping her ex husband through his cancer treatments and living with him as he can be harsh per the patient (states his personality changed after a medical event where he suffered cardiac arrest). - Continue alprazolam - Continue to monitor

## 2017-07-27 NOTE — Assessment & Plan Note (Addendum)
Patient states that for the past month she has experienced some intermittent numbness in her left hand. She states the numbness last for just a few seconds and then she shakes her hand and it immediately resolves. This occurs daily. She has no numbness or pain in her elbow, shoulder, or neck. She state the pain can occur when her arm is resting at her side and is not associated with leaning her arm on a surface. The pain is associated with stress, which sh has been under as she has been helping her ex husband through his cancer treatments. On exam negative tinel's sign. No pain on palpation. Normal range of motion for her.  Her pain is suspicious for neuropathic etiology (such as impingement), but her symptom onset associated with stress and not position is not typical. Her pain may be due to increaed stress in helping her ex husband through his cancer treatments and living with him as he can be harsh per the patient (states his personality changed after a medical event where he suffered cardiac arrest). - Of note Pt a history of years "bilateral fingertip numbness" in her chart, will discuss this at next visit to clarify if this is the same sensation. - Will continue to monitor for now - Return precautions given

## 2017-07-28 LAB — HEPATITIS C ANTIBODY: Hep C Virus Ab: <0.1 s/co ratio (ref 0.0–0.9)

## 2017-07-31 NOTE — Addendum Note (Signed)
Addended by: Gilles Chiquito B on: 07/31/2017 09:33 AM   Modules accepted: Level of Service

## 2017-07-31 NOTE — Progress Notes (Signed)
Internal Medicine Clinic Attending  Case discussed with Dr. Melvin  at the time of the visit.  We reviewed the resident's history and exam and pertinent patient test results.  I agree with the assessment, diagnosis, and plan of care documented in the resident's note.  

## 2017-08-03 ENCOUNTER — Other Ambulatory Visit: Payer: Self-pay | Admitting: Internal Medicine

## 2017-08-03 DIAGNOSIS — Z1231 Encounter for screening mammogram for malignant neoplasm of breast: Secondary | ICD-10-CM

## 2017-08-04 ENCOUNTER — Encounter: Payer: Self-pay | Admitting: Internal Medicine

## 2017-08-04 NOTE — Progress Notes (Signed)
Patient mailed letter informing her of her negative hepatitis C screen.  Pearson Grippe, DO IM PGY-1 Pager: [phone]

## 2017-08-22 ENCOUNTER — Ambulatory Visit
Admission: RE | Admit: 2017-08-22 | Discharge: 2017-08-22 | Disposition: A | Discharge status: Home / Self Care | Payer: Medicare Other | Source: Ambulatory Visit | Attending: Internal Medicine | Admitting: Internal Medicine

## 2017-08-22 DIAGNOSIS — Z1231 Encounter for screening mammogram for malignant neoplasm of breast: Secondary | ICD-10-CM | POA: Diagnosis not present

## 2017-10-06 ENCOUNTER — Ambulatory Visit: Payer: Medicare Other | Admitting: Family Medicine

## 2017-10-12 ENCOUNTER — Telehealth: Payer: Self-pay | Admitting: Internal Medicine

## 2017-10-13 ENCOUNTER — Ambulatory Visit: Payer: Medicare Other | Admitting: Family Medicine

## 2017-10-13 ENCOUNTER — Encounter

## 2017-10-27 ENCOUNTER — Ambulatory Visit: Payer: Medicare Other | Admitting: Family Medicine

## 2017-11-05 ENCOUNTER — Other Ambulatory Visit: Payer: Self-pay | Admitting: Internal Medicine

## 2017-11-05 DIAGNOSIS — G4700 Insomnia, unspecified: Secondary | ICD-10-CM

## 2017-11-05 DIAGNOSIS — I10 Essential (primary) hypertension: Secondary | ICD-10-CM

## 2017-11-05 DIAGNOSIS — F341 Dysthymic disorder: Secondary | ICD-10-CM

## 2017-11-06 NOTE — Telephone Encounter (Signed)
Refills approved.

## 2017-12-02 ENCOUNTER — Other Ambulatory Visit (INDEPENDENT_AMBULATORY_CARE_PROVIDER_SITE_OTHER): Payer: Self-pay | Admitting: Physician Assistant

## 2017-12-04 NOTE — Telephone Encounter (Signed)
Please advise 

## 2017-12-07 ENCOUNTER — Other Ambulatory Visit (INDEPENDENT_AMBULATORY_CARE_PROVIDER_SITE_OTHER): Payer: Self-pay | Admitting: Physician Assistant

## 2017-12-08 NOTE — Telephone Encounter (Signed)
Please advise 

## 2017-12-11 NOTE — Telephone Encounter (Signed)
Patient aware.  Patient is already feeling better, will call back for appointment if anymore pain.

## 2018-01-04 ENCOUNTER — Encounter: Payer: Medicare Other | Admitting: Internal Medicine

## 2018-01-08 ENCOUNTER — Other Ambulatory Visit: Payer: Self-pay | Admitting: *Deleted

## 2018-01-08 ENCOUNTER — Telehealth: Payer: Self-pay | Admitting: Internal Medicine

## 2018-01-08 DIAGNOSIS — M19042 Primary osteoarthritis, left hand: Secondary | ICD-10-CM

## 2018-01-08 DIAGNOSIS — I10 Essential (primary) hypertension: Secondary | ICD-10-CM

## 2018-01-08 DIAGNOSIS — M19041 Primary osteoarthritis, right hand: Secondary | ICD-10-CM

## 2018-01-08 DIAGNOSIS — F341 Dysthymic disorder: Secondary | ICD-10-CM

## 2018-01-08 DIAGNOSIS — G4700 Insomnia, unspecified: Secondary | ICD-10-CM

## 2018-01-08 NOTE — Telephone Encounter (Signed)
Requested in new encounter 

## 2018-01-08 NOTE — Telephone Encounter (Signed)
Need refills on lisinopril (PRINIVIL,ZESTRIL) 20 MG tablet, omeprazole (PRILOSEC) 20 MG capsule(Expired), furosemide (LASIX) 20 MG tablet(Expired), and ALPRAZolam Duanne Moron) 1 MG tablet at  [place], [place] - [address] [phone] (Phone) [phone] (Fax)

## 2018-01-11 ENCOUNTER — Other Ambulatory Visit (INDEPENDENT_AMBULATORY_CARE_PROVIDER_SITE_OTHER): Payer: Self-pay | Admitting: Physician Assistant

## 2018-01-11 MED ORDER — OMEPRAZOLE 20 MG PO CPDR: 20.0000 mg | DELAYED_RELEASE_CAPSULE | Freq: Every day | ORAL | 0 refills | Status: DC

## 2018-01-11 MED ORDER — LISINOPRIL 20 MG PO TABS: 20.0000 mg | ORAL_TABLET | Freq: Every day | ORAL | 0 refills | Status: DC

## 2018-01-11 MED ORDER — FUROSEMIDE 20 MG PO TABS: 20.0000 mg | ORAL_TABLET | ORAL | 0 refills | Status: DC

## 2018-01-11 MED ORDER — ALPRAZOLAM 1 MG PO TABS: 1.0000 mg | ORAL_TABLET | Freq: Every evening | ORAL | 0 refills | Status: DC | PRN

## 2018-01-11 NOTE — Telephone Encounter (Signed)
Refills approved, will discuss ongoing need for omeprazole at follow up.

## 2018-01-12 NOTE — Telephone Encounter (Signed)
Rx request 

## 2018-02-08 ENCOUNTER — Encounter: Payer: Self-pay | Admitting: Internal Medicine

## 2018-02-08 ENCOUNTER — Other Ambulatory Visit: Payer: Self-pay

## 2018-02-08 ENCOUNTER — Ambulatory Visit (INDEPENDENT_AMBULATORY_CARE_PROVIDER_SITE_OTHER): Payer: Medicare Other | Admitting: Internal Medicine

## 2018-02-08 VITALS — BP 128/59 | HR 72 | Temp 99.0°F | Ht 63.0 in | Wt 222.1 lb

## 2018-02-08 DIAGNOSIS — Z1211 Encounter for screening for malignant neoplasm of colon: Secondary | ICD-10-CM

## 2018-02-08 DIAGNOSIS — G4700 Insomnia, unspecified: Secondary | ICD-10-CM

## 2018-02-08 DIAGNOSIS — I10 Essential (primary) hypertension: Secondary | ICD-10-CM | POA: Diagnosis not present

## 2018-02-08 DIAGNOSIS — F419 Anxiety disorder, unspecified: Secondary | ICD-10-CM | POA: Diagnosis not present

## 2018-02-08 DIAGNOSIS — R911 Solitary pulmonary nodule: Secondary | ICD-10-CM

## 2018-02-08 DIAGNOSIS — Z23 Encounter for immunization: Secondary | ICD-10-CM

## 2018-02-08 DIAGNOSIS — Z0000 Encounter for general adult medical examination without abnormal findings: Secondary | ICD-10-CM

## 2018-02-08 DIAGNOSIS — F341 Dysthymic disorder: Secondary | ICD-10-CM

## 2018-02-08 DIAGNOSIS — Z79899 Other long term (current) drug therapy: Secondary | ICD-10-CM

## 2018-02-08 DIAGNOSIS — F329 Major depressive disorder, single episode, unspecified: Secondary | ICD-10-CM

## 2018-02-08 DIAGNOSIS — R918 Other nonspecific abnormal finding of lung field: Secondary | ICD-10-CM

## 2018-02-08 MED ORDER — ALPRAZOLAM 1 MG PO TABS: 1.0000 mg | ORAL_TABLET | Freq: Every evening | ORAL | 0 refills | Status: DC | PRN
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
## 2018-02-08 NOTE — Assessment & Plan Note (Signed)
History of Anxiety and Depression. Anxiety currently affects her the most when trying to sleep. Along with her insomnia this has been treated with alprazolam. Depression remains stable off medication. Ongoing evaluations for alprazolam side effects given patient age (every 3 months). No side effects at this time. - Continue alprazolam -Continue to monitor

## 2018-02-08 NOTE — Assessment & Plan Note (Signed)
-   Fecal occult card for colon cancer screening - Flu vaccine given

## 2018-02-08 NOTE — Patient Instructions (Signed)
Thank you for allowing [place] to care for you.   For your high blood pressure - BP elevated today - Please record home BP on card provided  For insomnia/anxiety - Your xanax has been refilled  For your lung nodule - A repeat CT scan has been ordered for you  You received your flu vaccine today and have been provided with a card for colon cancer screening.  Please follow up in about 3 months.

## 2018-02-08 NOTE — Assessment & Plan Note (Signed)
She takes 1 mg Xanax every night which helps with her anxiety and insomnia. She continues to take this medication appropriately. We have been providing refills as she continues to dowell with it and has not been experiencing side effects. Having patient follow up every 3 months to evaluate for devlopment of side effects and discussions aboutswitching to a different class of medication.  Patient remains resistant to switching at this time and gets good relief of her anxiety and insomniaand denies side effects (falls, drowsiness, confusion). - Refill Alprazolam 1mg qhs,for sleep - 3 month supply provided - Return to clinic in 3 months for ongoing management

## 2018-02-08 NOTE — Assessment & Plan Note (Signed)
BP today 155/66 at first and 128/59 on repeat. Patient states her blood pressure is typically in the 383K systolic when she takes it at home and is always more elevated her, this is likely given significant improvement on repeat. Will not make any changes to regimen today. Patient provided with card to record BP. - Lisinopril 20mg  Daily - Lasix 20mg  QoD

## 2018-02-09 NOTE — Progress Notes (Signed)
Internal Medicine Clinic Attending  Case discussed with Dr. Melvin  at the time of the visit.  We reviewed the resident's history and exam and pertinent patient test results.  I agree with the assessment, diagnosis, and plan of care documented in the resident's note.  

## 2018-02-18 DIAGNOSIS — Z1211 Encounter for screening for malignant neoplasm of colon: Secondary | ICD-10-CM | POA: Diagnosis not present

## 2018-02-21 ENCOUNTER — Other Ambulatory Visit: Payer: Medicare Other

## 2018-02-21 DIAGNOSIS — Z1211 Encounter for screening for malignant neoplasm of colon: Secondary | ICD-10-CM

## 2018-02-23 LAB — FECAL OCCULT BLOOD, IMMUNOCHEMICAL: Fecal Occult Bld: NEGATIVE

## 2018-02-26 ENCOUNTER — Encounter: Payer: Self-pay | Admitting: Internal Medicine

## 2018-02-26 NOTE — Progress Notes (Signed)
Patient mailed letter informing her of her negative FOBT results.

## 2018-02-28 ENCOUNTER — Other Ambulatory Visit: Payer: Self-pay | Admitting: Internal Medicine

## 2018-02-28 NOTE — Addendum Note (Signed)
Addended by: Neva Seat B on: 02/28/2018 03:33 PM   Modules accepted: Orders

## 2018-02-28 NOTE — Assessment & Plan Note (Addendum)
Patient due for 2 year follow up solitary pulmonary nodule at RUL per recommendations on 2017 CT.   ADDENDUM: Initially low dose follow up CT was ordered but this was unable to be processed and patient will need low dose screening CT ordered. - Cancer "follow up CT" - Order "screening CT"  SECOND ADDENDUM: Patient required to have normal dose CT without contrast for this follow exam - Cancer low dose screening CT - Order CT Chest WO Contrast  THIRD ADDENDUM: CT scan showed stable nodule in RUL. It also showed a new vague ground glass density ~78mm in the LLL. With Recommendations below: - Initial follow-up with CT at 12 months is recommended to confirm persistence. - If persistent, repeat CT is recommended every 2 years until 5 years of stability has been established.   Patient to be called and updated on results, no answer on initial call.

## 2018-03-01 NOTE — Addendum Note (Signed)
Addended by: Neva Seat B on: 03/01/2018 04:10 PM   Modules accepted: Orders

## 2018-03-09 ENCOUNTER — Ambulatory Visit (HOSPITAL_COMMUNITY)
Admission: RE | Admit: 2018-03-09 | Discharge: 2018-03-09 | Disposition: A | Discharge status: Home / Self Care | Payer: Medicare Other | Source: Ambulatory Visit | Attending: Internal Medicine | Admitting: Internal Medicine

## 2018-03-09 ENCOUNTER — Ambulatory Visit (INDEPENDENT_AMBULATORY_CARE_PROVIDER_SITE_OTHER): Payer: Medicare Other | Admitting: Sports Medicine

## 2018-03-09 ENCOUNTER — Encounter: Payer: Self-pay | Admitting: Sports Medicine

## 2018-03-09 VITALS — BP 132/68 | Ht 63.0 in | Wt 221.0 lb

## 2018-03-09 DIAGNOSIS — R918 Other nonspecific abnormal finding of lung field: Secondary | ICD-10-CM | POA: Insufficient documentation

## 2018-03-09 DIAGNOSIS — M75102 Unspecified rotator cuff tear or rupture of left shoulder, not specified as traumatic: Secondary | ICD-10-CM | POA: Diagnosis not present

## 2018-03-09 MED ORDER — NITROGLYCERIN 0.2 MG/HR TD PT24: MEDICATED_PATCH | TRANSDERMAL | 1 refills | Status: DC

## 2018-03-09 NOTE — Patient Instructions (Signed)
You have a tear of your rotator cuff in the left shoulder. Ice as needed Continue ibuprofen or naproxen as needed We will refer you to physical therapy. We will start you on a nitroglycerin protocol as detailed below We will have you follow-up in 6 weeks.  If you are not tolerating physical therapy may follow-up sooner and we can consider a steroid injection at that time.  Nitroglycerin Protocol   Apply 1/2 nitroglycerin patch to affected area daily.  Change position of patch within the affected area every 24 hours.  You may experience a headache during the first 1-2 weeks of using the patch, these should subside.  If you experience headaches after beginning nitroglycerin patch treatment, you may take your preferred over the counter pain reliever.  Another side effect of the nitroglycerin patch is skin irritation or rash related to patch adhesive.  Please notify our office if you develop more severe headaches or rash, and stop the patch.  Tendon healing with nitroglycerin patch may require 12 to 24 weeks depending on the extent of injury.  Men should not use if taking Viagra, Cialis, or Levitra.   Do not use if you have migraines or rosacea.

## 2018-03-09 NOTE — Progress Notes (Signed)
  Lori Bradford - 72 y.o. female MRN [id number]  Date of birth: [birth year]    SUBJECTIVE:      Chief Complaint:/ HPI:  72 year old female who had a fall about 4 weeks ago.  Patient states she slipped and fell onto her left side with her arm outstretched.  She reports lateral shoulder pain since that time.  The pain can be a 7/10 and radiates down into the lateral upper arm.  Her pain is worse with overhead movement or laying on that side.  She reports limited range of motion with reaching overhead.  She denies any numbness or tingling upper extremities.  She denies any bruising or erythema.  She has been taking naproxen, ibuprofen, and tramadol as needed for pain which is somewhat helpful.  She denies any history of injury to that shoulder.  No prior shoulder surgeries   ROS:     See HPI  PERTINENT  PMH / PSH FH / / SH:  Past Medical, Surgical, Social, and Family History Reviewed & Updated in the EMR.  Pertinent findings include:  History of bilateral TKA and right hip arthroplasty  OBJECTIVE: BP 132/68   Ht 5\' 3"  (1.6 m)   Wt 221 lb (100.2 kg)   BMI 39.15 kg/m   Physical Exam:  Vital signs are reviewed.  GEN: Alert and oriented, NAD Pulm: Breathing unlabored PSY: normal mood, congruent affect  MSK: Left shoulder: No obvious deformity or asymmetry. No bruising. No swelling Tenderness over the greater tuberosity.  No sniffing tenderness over the biceps tendon or AC joint. Limited range of motion with flexion and abduction.  Symmetric range of motion in external rotation and internal rotation NV intact distally Special Tests:  - Impingement: Pain with Hawkins and Neers.  - Supraspinatous: In with empty can.  4+/5 strength - Infraspinatous/Teres: 5/5 strength in ER without pain - Subscapularis: Positive belly press, positive bear hug.  4+/5 strength - Biceps tendon: Negative Speeds.  Ultrasound of Shoulder-Left Shoulder  BT short: Biceps tendon was not visualized on short axis  at the proximal aspect of the bicipital groove.  There is significant degenerative changes and calcifications at the bicipital groove.  The biceps tendon is visualized in short axis more distally.  Fluid present surrounding the biceps tendon BT long: In long axis view, there is evidence of a biceps tendon tear.  The tendon is not completely retracted.  The tear corresponds with the lack of visualization in short axis. Supraspinatus tendon:  Partial-thickness tear of the supraspinatus visualized  Subscapularis tendon:  Significant hypoechoic change seen within the subscapularis tendon consistent with full-thickness tear.  There are some fibers intact distally but are not contiguous with proximal tendon Infraspinatus tendon:  There is a small interstitial tear visualized, otherwise tendon appears intact AC joint: AC joint arthritis      ASSESSMENT & PLAN:  1.  Left shoulder pain- patient has evidence of full-thickness subscapularis tear, partial-thickness supraspinatus tear, degenerative biceps tendon rupture. -Continue NSAIDs as needed -Will refer to physical therapy -Nitroglycerin protocol -Follow-up in 6 weeks.  Steroid injection if patient not tolerating physical therapy

## 2018-03-12 ENCOUNTER — Telehealth: Payer: Self-pay | Admitting: Internal Medicine

## 2018-03-12 NOTE — Telephone Encounter (Signed)
Patient called and informed of the result of her recent Chest CT. She was informed that RUL nodule was stable and a new ground glass nodule was noted in LLL that would need follow up in 12 months  Pearson Grippe, DO IM PGY-2

## 2018-04-07 ENCOUNTER — Other Ambulatory Visit (INDEPENDENT_AMBULATORY_CARE_PROVIDER_SITE_OTHER): Payer: Self-pay | Admitting: Physician Assistant

## 2018-04-11 ENCOUNTER — Other Ambulatory Visit: Payer: Self-pay | Admitting: *Deleted

## 2018-04-11 DIAGNOSIS — I10 Essential (primary) hypertension: Secondary | ICD-10-CM

## 2018-04-11 MED ORDER — LISINOPRIL 20 MG PO TABS: 20.0000 mg | ORAL_TABLET | Freq: Every day | ORAL | 3 refills | Status: DC

## 2018-04-11 NOTE — Telephone Encounter (Signed)
Refill approved.

## 2018-04-20 ENCOUNTER — Encounter: Payer: Self-pay | Admitting: Family Medicine

## 2018-04-20 ENCOUNTER — Ambulatory Visit (INDEPENDENT_AMBULATORY_CARE_PROVIDER_SITE_OTHER): Payer: Medicare Other | Admitting: Family Medicine

## 2018-04-20 VITALS — BP 132/68 | Ht 63.0 in | Wt 221.0 lb

## 2018-04-20 DIAGNOSIS — M79641 Pain in right hand: Secondary | ICD-10-CM

## 2018-04-20 DIAGNOSIS — M79642 Pain in left hand: Secondary | ICD-10-CM | POA: Diagnosis not present

## 2018-04-20 DIAGNOSIS — G8929 Other chronic pain: Secondary | ICD-10-CM

## 2018-04-20 DIAGNOSIS — M25512 Pain in left shoulder: Secondary | ICD-10-CM | POA: Diagnosis not present

## 2018-04-20 MED ORDER — DICLOFENAC SODIUM 1 % TD GEL: 2.0000 g | Freq: Four times a day (QID) | TRANSDERMAL | 1 refills | Status: DC

## 2018-04-20 NOTE — Patient Instructions (Signed)
I am glad to hear your shoulder pain is doing well. For the pain you are having in your biceps, we will treat that with some home rehab exercises. For the symptoms you are having in your hands, we will try treatment with Voltaren gel.  You can use this 4 times per day. We will have you follow-up in 6 weeks

## 2018-04-20 NOTE — Progress Notes (Signed)
[facility]: Attending Note: I have reviewed the chart, discussed wit the Sports Medicine Fellow. I agree with assessment and treatment plan as detailed in the Fellow's note.  

## 2018-04-20 NOTE — Progress Notes (Signed)
Lori Bradford - 72 y.o. female MRN [id number]  Date of birth: 07-Mar-1946    SUBJECTIVE:      Chief Complaint:/ HPI:  72 year old female here for follow-up for left shoulder pain.  She reports her pain is significantly improved.  She has been in [place] very active and has recently been assisted with construction of a outdoor shed..  She does note some mild continued pain in her biceps on the left but notes that it is not causing any limitations.  Today she also reports some discomfort in her bilateral hands.  She notes having arthritic pain and occasional throbbing feeling in the joints.  She also describes some numbness and tingling in the tips of all her fingers bilaterally.  She denies any specific trigger for this that she has identified.  It seems to just come and go.  Nothing in particular has been helpful.  Occasionally the arthritic pain in her hands does cause her to experience some weakness.  She denies any sniffing swelling or erythema.   ROS:     See HPI  PERTINENT  PMH / PSH FH / / SH:  Past Medical, Surgical, Social, and Family History Reviewed & Updated in the EMR.    OBJECTIVE: BP 132/68   Ht 5\' 3"  (1.6 m)   Wt 221 lb (100.2 kg)   BMI 39.15 kg/m   Physical Exam:  Vital signs are reviewed.  GEN: Alert and oriented, NAD Pulm: Breathing unlabored PSY: normal mood, congruent affect  MSK: Left shoulder: No obvious deformity Tenderness over the proximal biceps Somewhat limited in flexion and abduction but this is symmetric bilaterally and without pain NV intact distally Special Tests:  - Impingement: Mild pain with Hawkins and Neers.  - Supraspinatous: Negative empty can.  4+/5 strength symmetric bilaterally without pain - Infraspinatous/Teres: 4+/5 strength with ER symmetric bilaterally without pain - Subscapularis:  5/5 strength with IR symmetric bilaterally  Right hand: Arthritic changes at the PIP and DIP throughout the hand No focal tenderness to  palpation Full range of motion with flexion extension of the ears and wrist Sensation intact in C6-8 Negative Tinel's at the Carpal tunnel, Guyon's canal, and cubital tunnel.   Negative Finkelstein's. Negative Phalen's  Left hand: Arthritic changes at the PIP and DIP throughout the hand No focal tenderness to palpation Full range of motion with flexion extension of the ears and wrist Sensation intact in C6-8 Negative Tinel's at the Carpal tunnel., Guyon's canal, and cubital tunnel.   Negative Finkelstein's. Negative Phalen's   ASSESSMENT & PLAN:  1.  Left shoulder pain- this is significantly improved today.  At last visit patient had extensive rotator cuff tendinopathy and tears in the subscapularis, supraspinatus, and less so in the infraspinatus.  Today she demonstrates symmetric range of motion and strength.  Functionally she is doing quite well and remains very active.  She does have some residual pain around the proximal biceps.  On ultrasound at last visit she had degenerative rupture of the occiput biceps tendon without retraction.  We will give her some strengthening exercises to do at home.  She will follow-up in 6 weeks  2.  Bilateral hand pain- patient does have arthritic changes in the DIPs and PIPs in the hands bilaterally.  Otherwise, her exam is somewhat unremarkable.  There are no findings consistent with specific peripheral neuropathy or localization to a cervical nerve root. -Prescription for Voltaren gel.  We will see if this provides her some relief. - She will follow-up  in 6 weeks.  If the arthritic pain is relieved with topical NSAIDs but any neurologic symptoms persist she may require further work-up by her PCP

## 2018-07-12 ENCOUNTER — Ambulatory Visit (INDEPENDENT_AMBULATORY_CARE_PROVIDER_SITE_OTHER): Payer: Medicare Other | Admitting: Physician Assistant

## 2018-07-12 ENCOUNTER — Encounter (INDEPENDENT_AMBULATORY_CARE_PROVIDER_SITE_OTHER): Payer: Self-pay | Admitting: Physician Assistant

## 2018-07-12 VITALS — Ht 63.0 in | Wt 221.0 lb

## 2018-07-12 DIAGNOSIS — M25512 Pain in left shoulder: Secondary | ICD-10-CM

## 2018-07-12 MED ORDER — LIDOCAINE HCL 1 % IJ SOLN
3.0000 mL | INTRAMUSCULAR | Status: AC | PRN
  Administered 2018-07-12: 3 mL

## 2018-07-12 MED ORDER — METHYLPREDNISOLONE ACETATE 40 MG/ML IJ SUSP
40.0000 mg | INTRAMUSCULAR | Status: AC | PRN
  Administered 2018-07-12: 40 mg via INTRA_ARTICULAR
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
## 2018-07-27 ENCOUNTER — Ambulatory Visit: Payer: Medicare Other | Admitting: Family Medicine

## 2018-07-30 ENCOUNTER — Encounter (INDEPENDENT_AMBULATORY_CARE_PROVIDER_SITE_OTHER): Payer: Self-pay | Admitting: Physician Assistant

## 2018-07-30 ENCOUNTER — Ambulatory Visit (INDEPENDENT_AMBULATORY_CARE_PROVIDER_SITE_OTHER): Payer: Medicaid Other

## 2018-07-30 ENCOUNTER — Ambulatory Visit (INDEPENDENT_AMBULATORY_CARE_PROVIDER_SITE_OTHER): Payer: Medicare Other | Admitting: Physician Assistant

## 2018-07-30 DIAGNOSIS — M7542 Impingement syndrome of left shoulder: Secondary | ICD-10-CM

## 2018-07-30 DIAGNOSIS — R200 Anesthesia of skin: Secondary | ICD-10-CM

## 2018-07-30 DIAGNOSIS — R202 Paresthesia of skin: Secondary | ICD-10-CM | POA: Diagnosis not present
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
## 2018-08-02 ENCOUNTER — Other Ambulatory Visit: Payer: Self-pay | Admitting: Internal Medicine

## 2018-08-02 DIAGNOSIS — G4700 Insomnia, unspecified: Secondary | ICD-10-CM

## 2018-08-02 DIAGNOSIS — F341 Dysthymic disorder: Secondary | ICD-10-CM

## 2018-08-02 NOTE — Telephone Encounter (Signed)
Next appt scheduled 3/3 in [place].

## 2018-08-02 NOTE — Telephone Encounter (Signed)
Refill approved.

## 2018-08-07 ENCOUNTER — Other Ambulatory Visit: Payer: Self-pay

## 2018-08-07 ENCOUNTER — Ambulatory Visit (INDEPENDENT_AMBULATORY_CARE_PROVIDER_SITE_OTHER): Payer: Medicare Other | Admitting: Internal Medicine

## 2018-08-07 ENCOUNTER — Encounter: Payer: Self-pay | Admitting: Internal Medicine

## 2018-08-07 VITALS — BP 128/48 | HR 82 | Temp 97.6°F | Wt 228.9 lb

## 2018-08-07 DIAGNOSIS — M85859 Other specified disorders of bone density and structure, unspecified thigh: Secondary | ICD-10-CM

## 2018-08-07 DIAGNOSIS — Z87310 Personal history of (healed) osteoporosis fracture: Secondary | ICD-10-CM

## 2018-08-07 DIAGNOSIS — F5104 Psychophysiologic insomnia: Secondary | ICD-10-CM

## 2018-08-07 DIAGNOSIS — Z79899 Other long term (current) drug therapy: Secondary | ICD-10-CM

## 2018-08-07 DIAGNOSIS — R200 Anesthesia of skin: Secondary | ICD-10-CM | POA: Diagnosis not present

## 2018-08-07 DIAGNOSIS — M8580 Other specified disorders of bone density and structure, unspecified site: Secondary | ICD-10-CM | POA: Diagnosis not present

## 2018-08-07 DIAGNOSIS — Z96653 Presence of artificial knee joint, bilateral: Secondary | ICD-10-CM

## 2018-08-07 DIAGNOSIS — R202 Paresthesia of skin: Secondary | ICD-10-CM | POA: Diagnosis not present

## 2018-08-07 DIAGNOSIS — M810 Age-related osteoporosis without current pathological fracture: Secondary | ICD-10-CM | POA: Diagnosis not present

## 2018-08-07 DIAGNOSIS — Z7983 Long term (current) use of bisphosphonates: Secondary | ICD-10-CM

## 2018-08-07 DIAGNOSIS — G4700 Insomnia, unspecified: Secondary | ICD-10-CM

## 2018-08-07 DIAGNOSIS — F419 Anxiety disorder, unspecified: Secondary | ICD-10-CM

## 2018-08-07 DIAGNOSIS — Z96641 Presence of right artificial hip joint: Secondary | ICD-10-CM

## 2018-08-07 DIAGNOSIS — M170 Bilateral primary osteoarthritis of knee: Secondary | ICD-10-CM

## 2018-08-07 MED ORDER — ALENDRONATE SODIUM 70 MG PO TABS: 70.0000 mg | ORAL_TABLET | ORAL | 0 refills | Status: DC

## 2018-08-07 MED ORDER — ZOLPIDEM TARTRATE 5 MG PO TABS: 5.0000 mg | ORAL_TABLET | Freq: Every evening | ORAL | 0 refills | Status: DC | PRN

## 2018-08-07 NOTE — Patient Instructions (Addendum)
It was a pleasure to see you today Lori Bradford. Please make the following changes:  -Please get your bone density scan done to establish a baseline for you -Please start taking alendronate 70mg  once weekly -Please alternate xanax and ambien nightly for 2 weeks and then alternate taking xanax every third day -Follow up in 1 month  If you have any questions or concerns, please call our clinic at [phone] between 9am-5pm and after hours call [phone] and ask for the internal medicine resident on call. If you feel you are having a medical emergency please call 911.   Thank you, we look forward to help you remain healthy!  [name], MD Internal Medicine PGY2  Alendronate; Cholecalciferol tablets What is this medicine? ALENDRONATE; CHOLECALCIFEROL (a LEN droe nate; KOL e cal SIF er ol) has two medicines to help reduce calcium loss from bones and to increase the production of normal, healthy bone in patients with osteoporosis. This medicine may be used for other purposes; ask your health care provider or pharmacist if you have questions. COMMON BRAND NAME(S): Fosamax Plus D What should I tell my health care provider before I take this medicine? They need to know if you have any of these conditions: - dental disease -kidney disease -low level or high level of blood calcium -problems sitting or standing for 30 minutes -problems swallowing -stomach, intestine, or esophagus problems like acid reflux or GERD -vitamin D toxicity -an unusual or allergic reaction to alendronate, cholecalciferol, medicines, foods, lactose, dyes, or preservatives -pregnant or trying to get pregnant -breast-feeding How should I use this medicine? You must take this medicine exactly as directed or you will lower the amount of medicine you absorb into your body or you may cause yourself harm. Take your dose by mouth first thing in the morning, after you are up for the day. Do not eat or drink anything before  you take this medicine. Swallow your medicine with a full glass (6 to 8 fluid ounces) of plain water. Do not take this tablet with any with any other drink. Follow the directions on the prescription label. After taking this medicine, do not eat breakfast, drink, or take any medicines or vitamins for at least 30 minutes. Stand or sit up for at least 30 minutes after you take this medicine; do not lie down. Take the medicine on the same day every week. Do not take your medicine more often than directed. Do not stop taking except on your doctor's advice. Talk to your pediatrician regarding the use of this medicine in children. Special care may be needed. Overdosage: If you think you have taken too much of this medicine contact a poison control center or emergency room at once. NOTE: This medicine is only for you. Do not share this medicine with others. What if I miss a dose? If you miss a dose, take the dose on the morning after you remember. Take your next dose on your regular chosen day of the week, but do not take 2 tablets on the same day. Do not take double or extra doses. What may interact with this medicine? -antacids -aspirin and aspirin like drugs -calcium supplements, especially calcium with vitamin D -cholestyramine or colestipol -cimetidine, ranitidine, or other medicines used to decrease stomach acid -corticosteroids -iron supplements -magnesium supplements -mineral oil -NSAIDs, medicines for pain and inflammation, like ibuprofen or naproxen -orlistat -phenobarbital -phenytoin -phosphorous supplements -primidone -steroid medicines like prednisone or cortisone -thiazide diuretics -vitamins with minerals, especially with vitamin D This  list may not describe all possible interactions. Give your health care provider a list of all the medicines, herbs, non-prescription drugs, or dietary supplements you use. Also tell them if you smoke, drink alcohol, or use illegal drugs. Some items may  interact with your medicine. What should I watch for while using this medicine? Visit your doctor or health care professional for regular checkups. It may be some time before you see the benefit from this medicine. Do not stop taking your medicine unless your doctor tells you to. Your doctor may order blood tests or other tests to see how you are doing. You should make sure that you get enough calcium and vitamin D while you are taking this medicine. Discuss the foods you eat and the vitamins you take with your health care professional. If you have pain when swallowing, difficulty swallowing, heartburn, or stomach pain, immediately call your doctor or health care professional. If you are taking an antacid, a mineral supplement like calcium or iron, or a vitamin with minerals, wait to take them at least 30 minutes after you take this medicine. Do not take them at same time. This medicine can make you more sensitive to the sun. If you get a rash while taking this medicine, sunlight may cause the rash to get worse. Keep out of the sun. If you cannot avoid being in the sun, wear protective clothing and use sunscreen. Do not use sun lamps or tanning beds/booths. Some people who take this medicine have severe bone, joint, and/or muscle pain. This medicine may also increase your risk for a broken thigh bone. Tell your doctor right away if you have pain in your upper leg or groin. Tell your doctor if you have any pain that does not go away or that gets worse. What side effects may I notice from receiving this medicine? Side effects that you should report to your doctor or health care professional as soon as possible: -allergic reactions like skin rash, itching or hives, swelling of the face, lips, or tongue -bone, muscle, or joint pain -changes in vision -heartburn or chest pain -pain or difficulty swallowing -redness, blistering, peeling or loosening of the skin, including inside the mouth -stomach  pain -unusual bleeding or bruising -unusually weak or tired -vomiting Side effects that usually do not require medical attention (report to your doctor or health care professional if they continue or are bothersome): -diarrhea or constipation -headache -nausea -stomach gas or fullness This list may not describe all possible side effects. Call your doctor for medical advice about side effects. You may report side effects to FDA at 1-800-FDA-1088. Where should I keep my medicine? Keep out of the reach of children. Store at room temperature between 20 and 25 degrees C (68 and 77 degrees F). Protect the medicine from moisture and light. Throw away any unused medicine after the expiration date. NOTE: This sheet is a summary. It may not cover all possible information. If you have questions about this medicine, talk to your doctor, pharmacist, or health care provider.  2019 Elsevier/Gold Standard (2010-11-19 08:57:31)

## 2018-08-07 NOTE — Assessment & Plan Note (Addendum)
The patient has been on chronic benzodiazepines for insomnia and anxiety for over 10 years. The patient mentioned that she currently does not have any anxiety and is using xanax 1mg  daily only for insomnia. Her GAD7 score was 0.   Assessment and plan  Given that the patient has osteoporosis and is at high risk for a fracture, her medication should be off of all centrally acting medication. Discussed slow taper of benzodiazepine with Dr. Eppie Gibson.   -Advised patient to alternate ambien 5mg  with xanax 1mg  for 2 weeks and then start taking xanax 1mg  every third day for the subsequent 2 weeks. Educated patient about the importance of a slow taper and advised her to follow up in 1 month.

## 2018-08-07 NOTE — Progress Notes (Signed)
Case discussed with Dr. Maricela Bo at the time of the visit.  We reviewed the resident's history and exam and pertinent patient test results.  I agree with the assessment, diagnosis and plan of care documented in the resident's note.  We have ordered a DEXA scan to assess ability of bisphosphonate to prevent further BMD loss when scan is repeated in 2 years on bisphosphonate therapy.

## 2018-08-07 NOTE — Assessment & Plan Note (Signed)
Left shoulder pain radiating down left forearm accompanied with burning and sensation changes in comparison to right forearm. Left shoulder xray without any fractures. Given that the patient had positive phalen's test during encounter, it is reasonable for the patient to have nerve conduction study to determine if patient may have carpal tunnel syndrome.   -continue following with PMR -EMG nerve conduction study of left hand -patient should continue following with [facility] for left shoulder impingement

## 2018-08-07 NOTE — Assessment & Plan Note (Signed)
The patient was diagnosed with osteopenia based on DEXA scan done in 2014 that showed BMD of 0.762 at left femoral neck and T score of -2.0.  Since this time the patient had a right hip fracture in 2017 after falling on ice.  She is now status post a right hip replacement.  Assessment and plan The patient's FRAX collation showed a 10% risk of major osteoporotic fracture and a 0.3% risk of hip fracture.  Due to patient having a fracture after mechanical fall she is defined as having osteoporosis.  -Started patient on alendronate 70 mg weekly -Ordered DEXA scan to establish baseline for patient
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
## 2018-08-13 NOTE — Addendum Note (Signed)
Addended by: [name] on: 08/13/2018 10:24 AM   Modules accepted: Orders

## 2018-08-15 ENCOUNTER — Ambulatory Visit (INDEPENDENT_AMBULATORY_CARE_PROVIDER_SITE_OTHER): Payer: Medicare Other | Admitting: Physical Medicine and Rehabilitation

## 2018-08-15 ENCOUNTER — Other Ambulatory Visit: Payer: Self-pay

## 2018-08-15 ENCOUNTER — Encounter (INDEPENDENT_AMBULATORY_CARE_PROVIDER_SITE_OTHER): Payer: Self-pay | Admitting: Physical Medicine and Rehabilitation

## 2018-08-15 DIAGNOSIS — R202 Paresthesia of skin: Secondary | ICD-10-CM | POA: Diagnosis not present

## 2018-08-15 NOTE — Progress Notes (Signed)
 .  Numeric Pain Rating Scale and Functional Assessment Average Pain 9   In the last MONTH (on 0-10 scale) has pain interfered with the following?  1. General activity like being  able to carry out your everyday physical activities such as walking, climbing stairs, carrying groceries, or moving a chair?  Rating(7)

## 2018-08-16 NOTE — Procedures (Signed)
EMG & NCV Findings: Evaluation of the left median motor nerve showed prolonged distal onset latency (6.3 ms) and decreased conduction velocity (Elbow-Wrist, 46 m/s).  The left median (across palm) sensory nerve showed prolonged distal peak latency (Wrist, 5.3 ms) and prolonged distal peak latency (Palm, 2.5 ms).  All remaining nerves (as indicated in the following tables) were within normal limits.    All examined muscles (as indicated in the following table) showed no evidence of electrical instability.    Impression: The above electrodiagnostic study is ABNORMAL and reveals evidence of a severe left median nerve entrapment at the wrist (carpal tunnel syndrome) affecting sensory and motor components.   There is no significant electrodiagnostic evidence of any other focal nerve entrapment, brachial plexopathy or cervical radiculopathy  Recommendations: 1.  Follow-up with referring physician. 2.  Continue current management of symptoms. 3.  Continue use of resting splint at night-time and as needed during the day. 4.  Suggest surgical evaluation.  ___________________________ Laurence Spates FAAPMR Board Certified, American Board of Physical Medicine and Rehabilitation    Nerve Conduction Studies Anti Sensory Summary Table   Stim Site NR Peak (ms) Norm Peak (ms) P-T Amp (V) Norm P-T Amp Site1 Site2 Delta-P (ms) Dist (cm) Vel (m/s) Norm Vel (m/s)  Left Median Acr Palm Anti Sensory (2nd Digit)  32C  Wrist    *5.3 <3.6 22.8 >10 Wrist Palm 2.8 0.0    Palm    *2.5 <2.0 3.1         Left Radial Anti Sensory (Base 1st Digit)  32.1C  Wrist    2.0 <3.1 55.8  Wrist Base 1st Digit 2.0 0.0    Left Ulnar Anti Sensory (5th Digit)  32.1C  Wrist    3.5 <3.7 24.1 >15.0 Wrist 5th Digit 3.5 14.0 40 >38   Motor Summary Table   Stim Site NR Onset (ms) Norm Onset (ms) O-P Amp (mV) Norm O-P Amp Site1 Site2 Delta-0 (ms) Dist (cm) Vel (m/s) Norm Vel (m/s)  Left Median Motor (Abd Poll Brev)  32.3C  Wrist     *6.3 <4.2 6.2 >5 Elbow Wrist 3.5 16.0 *46 >50  Elbow    9.8  4.7         Left Ulnar Motor (Abd Dig Min)  32.4C  Wrist    2.9 <4.2 8.4 >3 B Elbow Wrist 2.7 16.0 59 >53  B Elbow    5.6  8.0  A Elbow B Elbow 1.4 10.0 71 >53  A Elbow    7.0  8.2          EMG   Side Muscle Nerve Root Ins Act Fibs Psw Amp Dur Poly Recrt Int Fraser Din Comment  Left Abd Poll Brev Median C8-T1 Nml Nml Nml Nml Nml 0 Nml Nml   Left 1stDorInt Ulnar C8-T1 Nml Nml Nml Nml Nml 0 Nml Nml   Left PronatorTeres Median C6-7 Nml Nml Nml Nml Nml 0 Nml Nml   Left Biceps Musculocut C5-6 Nml Nml Nml Nml Nml 0 Nml Nml   Left Deltoid Axillary C5-6 Nml Nml Nml Nml Nml 0 Nml Nml     Nerve Conduction Studies Anti Sensory Left/Right Comparison   Stim Site L Lat (ms) R Lat (ms) L-R Lat (ms) L Amp (V) R Amp (V) L-R Amp (%) Site1 Site2 L Vel (m/s) R Vel (m/s) L-R Vel (m/s)  Median Acr Palm Anti Sensory (2nd Digit)  32C  Wrist *5.3   22.8   Wrist Palm  Palm *2.5   3.1         Radial Anti Sensory (Base 1st Digit)  32.1C  Wrist 2.0   55.8   Wrist Base 1st Digit     Ulnar Anti Sensory (5th Digit)  32.1C  Wrist 3.5   24.1   Wrist 5th Digit 40     Motor Left/Right Comparison   Stim Site L Lat (ms) R Lat (ms) L-R Lat (ms) L Amp (mV) R Amp (mV) L-R Amp (%) Site1 Site2 L Vel (m/s) R Vel (m/s) L-R Vel (m/s)  Median Motor (Abd Poll Brev)  32.3C  Wrist *6.3   6.2   Elbow Wrist *46    Elbow 9.8   4.7         Ulnar Motor (Abd Dig Min)  32.4C  Wrist 2.9   8.4   B Elbow Wrist 59    B Elbow 5.6   8.0   A Elbow B Elbow 71    A Elbow 7.0   8.2            Waveforms:
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
## 2018-08-20 ENCOUNTER — Ambulatory Visit (INDEPENDENT_AMBULATORY_CARE_PROVIDER_SITE_OTHER): Payer: Medicare Other | Admitting: Physician Assistant

## 2018-08-20 ENCOUNTER — Encounter (INDEPENDENT_AMBULATORY_CARE_PROVIDER_SITE_OTHER): Payer: Self-pay | Admitting: Physician Assistant

## 2018-08-20 ENCOUNTER — Other Ambulatory Visit: Payer: Self-pay

## 2018-08-20 DIAGNOSIS — G5602 Carpal tunnel syndrome, left upper limb: Secondary | ICD-10-CM | POA: Diagnosis not present
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
## 2018-08-24 ENCOUNTER — Other Ambulatory Visit (INDEPENDENT_AMBULATORY_CARE_PROVIDER_SITE_OTHER): Payer: Self-pay | Admitting: Orthopedic Surgery

## 2018-08-24 ENCOUNTER — Telehealth (INDEPENDENT_AMBULATORY_CARE_PROVIDER_SITE_OTHER): Payer: Self-pay

## 2018-08-24 MED ORDER — TRAMADOL HCL 50 MG PO TABS: 50.0000 mg | ORAL_TABLET | Freq: Four times a day (QID) | ORAL | 0 refills | Status: DC | PRN

## 2018-08-24 NOTE — Telephone Encounter (Signed)
Pt was scheduled for a CTR and had to cancel surgery with CB. Pt states that she takes naprosyn and this is not helpful for the pain will you give rx for something else?

## 2018-08-24 NOTE — Telephone Encounter (Signed)
I called and lm on vm to advise the pt that tramadol rx has been sent to pharm. To call with questions.

## 2018-08-24 NOTE — Telephone Encounter (Signed)
rx for ultram sent

## 2018-08-24 NOTE — Telephone Encounter (Signed)
Spoke with patient about cancelling her surgery next week due to [facility] Virus.  She is requesting pain medication.  She already takes Naprosyn daily.  Please call her.  [phone]  She uses [facility] in [place].

## 2018-09-07 ENCOUNTER — Other Ambulatory Visit: Payer: Self-pay

## 2018-09-07 ENCOUNTER — Ambulatory Visit (INDEPENDENT_AMBULATORY_CARE_PROVIDER_SITE_OTHER): Payer: Medicare Other | Admitting: Internal Medicine

## 2018-09-07 DIAGNOSIS — R200 Anesthesia of skin: Secondary | ICD-10-CM | POA: Diagnosis not present

## 2018-09-07 DIAGNOSIS — R202 Paresthesia of skin: Secondary | ICD-10-CM

## 2018-09-07 DIAGNOSIS — G4700 Insomnia, unspecified: Secondary | ICD-10-CM | POA: Diagnosis not present

## 2018-09-07 DIAGNOSIS — M810 Age-related osteoporosis without current pathological fracture: Secondary | ICD-10-CM | POA: Diagnosis not present

## 2018-09-07 DIAGNOSIS — I10 Essential (primary) hypertension: Secondary | ICD-10-CM | POA: Diagnosis not present

## 2018-09-07 NOTE — Assessment & Plan Note (Signed)
Patient is scheduled to get a bone density study in May 2020.

## 2018-09-07 NOTE — Assessment & Plan Note (Signed)
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 At previous visit she was instructed to do a taper by alternating xanax 1mg  with ambien 5mg  for 2 weeks and then [place] with xanax 1mg  every third day for the subsequent 2 weeks.   Patient states that she tried the taper for 4 days however she kept waking up and therefore she went back to her old regimen.  She denies any falls or dizziness.   Assessment and plan  Unfortunately, patient failed taper of benzodiazepine and she does not want to try again at this time.  I will closely monitor patient for adverse effects of benzodiazepine.
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
## 2018-09-07 NOTE — Assessment & Plan Note (Signed)
Patient was seen by [facility] orthopedic on 08/20/2018.  EMG nerve conduction study showed severe median nerve entrapment.  Patient will need carpal tunnel release will be scheduled after coded pandemic was better.  -Continuing following with [facility] orthopedic

## 2018-09-07 NOTE — Assessment & Plan Note (Signed)
Patient states that her blood pressure is ranging 120-130s/60-70.  She is currently taking lisinopril 20mg  qd.   -Continue lisinopril 20 mg daily

## 2018-09-07 NOTE — Progress Notes (Signed)
Medicine attending: Medical history, presenting problems,  and medications, reviewed with resident physician Dr [name] on the day of the patient telephone consultation and I concur with her evaluation and management plan.

## 2018-09-09 ENCOUNTER — Other Ambulatory Visit: Payer: Self-pay | Admitting: Internal Medicine

## 2018-09-10 ENCOUNTER — Ambulatory Visit (INDEPENDENT_AMBULATORY_CARE_PROVIDER_SITE_OTHER): Payer: Medicare Other | Admitting: Physician Assistant

## 2018-09-21 ENCOUNTER — Other Ambulatory Visit (INDEPENDENT_AMBULATORY_CARE_PROVIDER_SITE_OTHER): Payer: Self-pay | Admitting: Orthopedic Surgery

## 2018-09-24 NOTE — Telephone Encounter (Signed)
This is a [facility] pt 

## 2018-09-24 NOTE — Telephone Encounter (Signed)
Please adivse

## 2018-10-10 ENCOUNTER — Other Ambulatory Visit: Payer: Medicare Other

## 2018-10-18 ENCOUNTER — Other Ambulatory Visit: Payer: Self-pay | Admitting: Orthopaedic Surgery

## 2018-10-18 DIAGNOSIS — G5602 Carpal tunnel syndrome, left upper limb: Secondary | ICD-10-CM | POA: Diagnosis not present

## 2018-10-18 MED ORDER — HYDROCODONE-ACETAMINOPHEN 5-325 MG PO TABS: 1.0000 | ORAL_TABLET | Freq: Four times a day (QID) | ORAL | 0 refills | Status: DC | PRN

## 2018-10-30 ENCOUNTER — Other Ambulatory Visit: Payer: Self-pay | Admitting: Internal Medicine

## 2018-10-30 DIAGNOSIS — M19041 Primary osteoarthritis, right hand: Secondary | ICD-10-CM

## 2018-11-03 ENCOUNTER — Other Ambulatory Visit (INDEPENDENT_AMBULATORY_CARE_PROVIDER_SITE_OTHER): Payer: Self-pay | Admitting: Orthopaedic Surgery

## 2018-11-03 NOTE — Telephone Encounter (Signed)
Rx request 

## 2018-11-05 ENCOUNTER — Inpatient Hospital Stay: Payer: Medicare Other | Admitting: Orthopaedic Surgery

## 2018-11-06 ENCOUNTER — Ambulatory Visit (INDEPENDENT_AMBULATORY_CARE_PROVIDER_SITE_OTHER): Payer: Medicare Other | Admitting: Orthopaedic Surgery

## 2018-11-06 ENCOUNTER — Other Ambulatory Visit: Payer: Self-pay

## 2018-11-06 ENCOUNTER — Encounter: Payer: Self-pay | Admitting: Orthopaedic Surgery

## 2018-11-06 DIAGNOSIS — Z9889 Other specified postprocedural states: Secondary | ICD-10-CM

## 2018-11-06 NOTE — Progress Notes (Signed)
HPI: Lori Bradford returns today follow-up of left carpal tunnel release on 10/18/2018.  She is overall doing well.  States some of them numbness tingling in the left hand is definitely resolving.  She has had no fevers chills.  Physical exam: Left hand surgical incisions healing well well approximated with interrupted nylon sutures without any signs of gross infection.  There is no dehiscence of the wound.  Good range of motion of the fingers without pain.  Impression: Status post left carpal tunnel release 10/18/2018.  Plan: She will work on scar tissue mobilization.  Stitches were removed Steri-Strips applied.  She is able to get the hand wet for hygiene purposes but no submerging of the hand.  We will see her back in a month sooner if there is any questions or concerns.

## 2018-11-10 ENCOUNTER — Other Ambulatory Visit: Payer: Self-pay | Admitting: Internal Medicine

## 2018-11-10 DIAGNOSIS — F341 Dysthymic disorder: Secondary | ICD-10-CM

## 2018-11-10 DIAGNOSIS — G4700 Insomnia, unspecified: Secondary | ICD-10-CM

## 2018-11-12 ENCOUNTER — Telehealth: Payer: Self-pay

## 2018-11-12 NOTE — Telephone Encounter (Signed)
Can you please resend to Dr. Lynnae January. I am not sure how I do that

## 2018-11-13 NOTE — Telephone Encounter (Signed)
Thank you :)

## 2018-11-13 NOTE — Telephone Encounter (Signed)
Would you pls call the pharmacy as Dr Maricela Bo has a valid DEA # and doesn't need an authorizing attending to prescribe.

## 2018-11-13 NOTE — Telephone Encounter (Signed)
Called [facility] - stated issue has been resolved by Stacee C.

## 2018-12-04 ENCOUNTER — Other Ambulatory Visit: Payer: Self-pay

## 2018-12-04 ENCOUNTER — Encounter: Payer: Self-pay | Admitting: Orthopaedic Surgery

## 2018-12-04 ENCOUNTER — Ambulatory Visit (INDEPENDENT_AMBULATORY_CARE_PROVIDER_SITE_OTHER): Payer: Medicare Other | Admitting: Orthopaedic Surgery

## 2018-12-04 DIAGNOSIS — M7542 Impingement syndrome of left shoulder: Secondary | ICD-10-CM | POA: Diagnosis not present

## 2018-12-04 DIAGNOSIS — Z9889 Other specified postprocedural states: Secondary | ICD-10-CM

## 2018-12-04 MED ORDER — METHYLPREDNISOLONE ACETATE 40 MG/ML IJ SUSP
40.0000 mg | INTRAMUSCULAR | Status: AC | PRN
  Administered 2018-12-04: 40 mg via INTRA_ARTICULAR

## 2018-12-04 MED ORDER — LIDOCAINE HCL 1 % IJ SOLN
3.0000 mL | INTRAMUSCULAR | Status: AC | PRN
  Administered 2018-12-04: 3 mL
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
## 2018-12-24 ENCOUNTER — Other Ambulatory Visit: Payer: Self-pay

## 2018-12-24 ENCOUNTER — Ambulatory Visit
Admission: RE | Admit: 2018-12-24 | Discharge: 2018-12-24 | Disposition: A | Discharge status: Home / Self Care | Payer: Medicare Other | Source: Ambulatory Visit | Attending: Internal Medicine | Admitting: Internal Medicine

## 2018-12-24 DIAGNOSIS — M85852 Other specified disorders of bone density and structure, left thigh: Secondary | ICD-10-CM | POA: Diagnosis not present

## 2018-12-24 DIAGNOSIS — Z780 Asymptomatic menopausal state: Secondary | ICD-10-CM | POA: Diagnosis not present

## 2019-01-29 ENCOUNTER — Other Ambulatory Visit: Payer: Self-pay | Admitting: Internal Medicine

## 2019-01-29 ENCOUNTER — Other Ambulatory Visit (INDEPENDENT_AMBULATORY_CARE_PROVIDER_SITE_OTHER): Payer: Self-pay | Admitting: Orthopaedic Surgery

## 2019-01-29 NOTE — Telephone Encounter (Signed)
I don't see the indication for lasix in the problem list or in the last few visits. I will defer this to Dr. Maricela Bo who should be able to address tonight.

## 2019-01-29 NOTE — Telephone Encounter (Addendum)
Patient has asked previous provider Dr. Trilby Drummer to prescribe lasix for leg edema. I will speak to patient about her need for this at next visit.

## 2019-02-08 ENCOUNTER — Other Ambulatory Visit: Payer: Self-pay | Admitting: Internal Medicine

## 2019-02-08 DIAGNOSIS — G4700 Insomnia, unspecified: Secondary | ICD-10-CM

## 2019-02-08 DIAGNOSIS — F341 Dysthymic disorder: Secondary | ICD-10-CM

## 2019-02-13 ENCOUNTER — Other Ambulatory Visit: Payer: Self-pay | Admitting: Internal Medicine

## 2019-02-13 DIAGNOSIS — G4700 Insomnia, unspecified: Secondary | ICD-10-CM

## 2019-02-13 DIAGNOSIS — F341 Dysthymic disorder: Secondary | ICD-10-CM
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
## 2019-02-19 ENCOUNTER — Encounter: Payer: Self-pay | Admitting: Internal Medicine

## 2019-02-19 ENCOUNTER — Other Ambulatory Visit: Payer: Self-pay

## 2019-02-19 ENCOUNTER — Ambulatory Visit (INDEPENDENT_AMBULATORY_CARE_PROVIDER_SITE_OTHER): Payer: Medicare Other | Admitting: Internal Medicine

## 2019-02-19 VITALS — BP 142/43 | HR 69 | Temp 98.2°F | Ht 63.0 in | Wt 227.1 lb

## 2019-02-19 DIAGNOSIS — Z23 Encounter for immunization: Secondary | ICD-10-CM

## 2019-02-19 DIAGNOSIS — M19041 Primary osteoarthritis, right hand: Secondary | ICD-10-CM

## 2019-02-19 DIAGNOSIS — M1990 Unspecified osteoarthritis, unspecified site: Secondary | ICD-10-CM

## 2019-02-19 DIAGNOSIS — M810 Age-related osteoporosis without current pathological fracture: Secondary | ICD-10-CM | POA: Diagnosis not present

## 2019-02-19 DIAGNOSIS — R918 Other nonspecific abnormal finding of lung field: Secondary | ICD-10-CM | POA: Diagnosis not present

## 2019-02-19 DIAGNOSIS — Z79899 Other long term (current) drug therapy: Secondary | ICD-10-CM

## 2019-02-19 DIAGNOSIS — I10 Essential (primary) hypertension: Secondary | ICD-10-CM

## 2019-02-19 DIAGNOSIS — Z87310 Personal history of (healed) osteoporosis fracture: Secondary | ICD-10-CM

## 2019-02-19 DIAGNOSIS — Z7983 Long term (current) use of bisphosphonates: Secondary | ICD-10-CM

## 2019-02-19 MED ORDER — FUROSEMIDE 20 MG PO TABS: 20.0000 mg | ORAL_TABLET | ORAL | 0 refills | Status: DC

## 2019-02-19 MED ORDER — OMEPRAZOLE 20 MG PO CPDR: 20.0000 mg | DELAYED_RELEASE_CAPSULE | Freq: Every day | ORAL | 1 refills | Status: DC

## 2019-02-19 MED ORDER — LISINOPRIL 20 MG PO TABS: 20.0000 mg | ORAL_TABLET | Freq: Every day | ORAL | 3 refills | Status: DC

## 2019-02-19 NOTE — Progress Notes (Signed)
Internal Medicine Clinic Attending  Case discussed with Dr. Maricela Bo at the time of the visit.  We reviewed the resident's history and exam and pertinent patient test results.  I agree with the assessment, diagnosis, and plan of care documented in the resident's note.   Patient meets criteria for osteoporosis based on piror fragility fracture in 2017 and would benefit from therapy. Unclear why patient did not tolerate alendronate. Would readdress at follow up. Consider alternative bisphosphonate or denosumab.

## 2019-02-19 NOTE — Patient Instructions (Signed)
It was a pleasure to see you today Ms. Shankel. Please make the following changes:  -Please continue taking all your medication as listed on this form  -I have collected blood work from you today and will call you if there are any abnormal results -Please get CT chest completed  -Follow up in 6 months  If you have any questions or concerns, please call our clinic at [phone] between 9am-5pm and after hours call [phone] and ask for the internal medicine resident on call. If you feel you are having a medical emergency please call 911.   Thank you, we look forward to help you remain healthy!  [name], MD Internal Medicine PGY3

## 2019-02-19 NOTE — Assessment & Plan Note (Addendum)
The patient is osteoporotic clinically based on fracture post mechanical fall. She has been on alendronate 70mg  weekly, but she states that she stopped taking it a few months ago as she felt that she made her have an increased appetite.  Her last bone density done July 2020 bmd at femoral neck 0.737 and t score of -2.2.   -will continue to monitor her   ADDENDUM I called and spoke to patient about there not being any documented adverse effects of alendronate causing increased appetite. I counseled her on trying the medication again and letting me know if she feels that way again. She agreed.   -continue alendronate 80mg  once weekly  -check vitamin d at next visit

## 2019-02-19 NOTE — Assessment & Plan Note (Signed)
The patient's blood pressure during this visit was 142/43. The patient is currently taking lisinopril 20mg  qd. Her last blood pressure visits are  BP Readings from Last 3 Encounters:  02/19/19 (!) 142/43  08/07/18 (!) 128/48  04/20/18 132/68   The patient does not report palpitations, dizziness, chest pain, sob.  Assessment and Plan Continue current regimen.

## 2019-02-19 NOTE — Assessment & Plan Note (Signed)
Placed order for CT chest to follow up on stable right upper lobe and new left lower lobe pulmonary nodue per ct chest October 2019.

## 2019-02-20 ENCOUNTER — Encounter: Payer: Self-pay | Admitting: Internal Medicine

## 2019-02-20 LAB — BMP8+ANION GAP
Anion Gap: 18 mmol/L (ref 10.0–18.0)
BUN/Creatinine Ratio: 10 — ABNORMAL LOW (ref 12–28)
BUN: 7 mg/dL — ABNORMAL LOW (ref 8–27)
CO2: 19 mmol/L — ABNORMAL LOW (ref 20–29)
Calcium: 9.4 mg/dL (ref 8.7–10.3)
Chloride: 103 mmol/L (ref 96–106)
Creatinine, Ser: 0.69 mg/dL (ref 0.57–1.00)
GFR calc Af Amer: 101 mL/min/{1.73_m2} (ref >59)
GFR calc non Af Amer: 87 mL/min/{1.73_m2} (ref >59)
Glucose: 116 mg/dL — ABNORMAL HIGH (ref 65–99)
Potassium: 4 mmol/L (ref 3.5–5.2)
Sodium: 140 mmol/L (ref 134–144)

## 2019-02-20 LAB — CBC
Hematocrit: 38.1 % (ref 34.0–46.6)
Hemoglobin: 12.4 g/dL (ref 11.1–15.9)
MCH: 29.4 pg (ref 26.6–33.0)
MCHC: 32.5 g/dL (ref 31.5–35.7)
MCV: 90 fL (ref 79–97)
Platelets: 279 10*3/uL (ref 150–450)
RBC: 4.22 x10E6/uL (ref 3.77–5.28)
RDW: 13.9 % (ref 11.7–15.4)
WBC: 7.8 10*3/uL (ref 3.4–10.8)

## 2019-03-11 ENCOUNTER — Other Ambulatory Visit: Payer: Self-pay

## 2019-03-11 ENCOUNTER — Ambulatory Visit (HOSPITAL_COMMUNITY)
Admission: RE | Admit: 2019-03-11 | Discharge: 2019-03-11 | Disposition: A | Discharge status: Home / Self Care | Payer: Medicare Other | Source: Ambulatory Visit | Attending: Internal Medicine | Admitting: Internal Medicine

## 2019-03-11 DIAGNOSIS — R911 Solitary pulmonary nodule: Secondary | ICD-10-CM | POA: Diagnosis not present

## 2019-03-11 DIAGNOSIS — R918 Other nonspecific abnormal finding of lung field: Secondary | ICD-10-CM | POA: Insufficient documentation

## 2019-03-12 ENCOUNTER — Encounter: Payer: Self-pay | Admitting: Internal Medicine

## 2019-03-14 ENCOUNTER — Telehealth: Payer: Self-pay

## 2019-03-14 NOTE — Telephone Encounter (Signed)
Thank you :)

## 2019-03-14 NOTE — Telephone Encounter (Signed)
Called pt and informed her results are in the mail to her, advised to call if she has questions

## 2019-03-14 NOTE — Telephone Encounter (Signed)
Requesting CT scan result. Please call pt back.

## 2019-04-13 ENCOUNTER — Other Ambulatory Visit: Payer: Self-pay | Admitting: Internal Medicine

## 2019-05-03 ENCOUNTER — Other Ambulatory Visit (INDEPENDENT_AMBULATORY_CARE_PROVIDER_SITE_OTHER): Payer: Self-pay | Admitting: Orthopaedic Surgery

## 2019-05-09 ENCOUNTER — Other Ambulatory Visit: Payer: Self-pay | Admitting: Internal Medicine

## 2019-05-09 DIAGNOSIS — F341 Dysthymic disorder: Secondary | ICD-10-CM

## 2019-05-09 DIAGNOSIS — G4700 Insomnia, unspecified: Secondary | ICD-10-CM

## 2019-05-20 ENCOUNTER — Other Ambulatory Visit: Payer: Self-pay

## 2019-05-20 ENCOUNTER — Encounter: Payer: Self-pay | Admitting: Physician Assistant

## 2019-05-20 ENCOUNTER — Ambulatory Visit (INDEPENDENT_AMBULATORY_CARE_PROVIDER_SITE_OTHER): Payer: Medicare Other | Admitting: Physician Assistant

## 2019-05-20 DIAGNOSIS — M7542 Impingement syndrome of left shoulder: Secondary | ICD-10-CM | POA: Diagnosis not present

## 2019-05-20 MED ORDER — LIDOCAINE HCL 1 % IJ SOLN
3.0000 mL | INTRAMUSCULAR | Status: AC | PRN
  Administered 2019-05-20: 3 mL

## 2019-05-20 MED ORDER — METHYLPREDNISOLONE ACETATE 40 MG/ML IJ SUSP
40.0000 mg | INTRAMUSCULAR | Status: AC | PRN
  Administered 2019-05-20: 11:00:00 40 mg via INTRA_ARTICULAR

## 2019-05-20 NOTE — Progress Notes (Signed)
   Procedure Note  Patient: Lori Bradford             Date of Birth: [birth year]           MRN: [id number]             Visit Date: 05/20/2019 HPI: [name] comes in today requesting injection of the left shoulder.  She was last seen for left shoulder impingement given injection treatment cardiac.  She is done well till recently.  She states she was sleeping on her left shoulder and had pain in the anterior aspect of the shoulder.  She is requesting injection.  She denies any numbness tingling down the arm.  Physical exam: Bilateral shoulders are 5 strength with external and internal rotation against resistance.  Empty can test is negative.  Slightly positive impingement left shoulder.   Procedures: Visit Diagnoses:  1. Shoulder impingement syndrome, left     Large Joint Inj: L subacromial bursa on 05/20/2019 10:35 AM Indications: pain Details: 22 G 1.5 in needle, superior approach  Arthrogram: No  Medications: 3 mL lidocaine 1 %; 40 mg methylPREDNISolone acetate 40 MG/ML Outcome: tolerated well, no immediate complications Procedure, treatment alternatives, risks and benefits explained, specific risks discussed. Consent was given by the patient. Immediately prior to procedure a time out was called to verify the correct patient, procedure, equipment, support staff and site/side marked as required. Patient was prepped and draped in the usual sterile fashion.     Plan: She will follow-up with [place] on as-needed basis in regards to the left shoulder.  She understands that she needs to wait at least 3 months between injections.  Questions encouraged and answered

## 2019-05-22 DIAGNOSIS — Z719 Counseling, unspecified: Secondary | ICD-10-CM | POA: Diagnosis not present

## 2019-06-28 ENCOUNTER — Telehealth: Payer: Self-pay | Admitting: Internal Medicine

## 2019-06-28 NOTE — Telephone Encounter (Signed)
Pt is wanting to know when her last shingles vaccine; pt contact [phone]   And   Need refills on cream ;pt contact [facility], [place] - [id number]

## 2019-06-28 NOTE — Telephone Encounter (Signed)
Thank you :)

## 2019-06-28 NOTE — Telephone Encounter (Signed)
rtc to pt, she states that she might have a shingles flare up because every so often she gets this itchy, smelly rash under her breast and in her groin. She ask for cream, advised appt. [place] 1/25 at 1015.

## 2019-07-01 ENCOUNTER — Ambulatory Visit: Payer: Medicare Other

## 2019-07-20 ENCOUNTER — Other Ambulatory Visit (INDEPENDENT_AMBULATORY_CARE_PROVIDER_SITE_OTHER): Payer: Self-pay | Admitting: Orthopaedic Surgery

## 2019-07-20 ENCOUNTER — Other Ambulatory Visit: Payer: Self-pay | Admitting: Internal Medicine

## 2019-08-07 ENCOUNTER — Other Ambulatory Visit: Payer: Self-pay | Admitting: Internal Medicine

## 2019-08-07 DIAGNOSIS — G4700 Insomnia, unspecified: Secondary | ICD-10-CM

## 2019-08-07 DIAGNOSIS — F341 Dysthymic disorder: Secondary | ICD-10-CM

## 2019-08-22 ENCOUNTER — Other Ambulatory Visit: Payer: Self-pay | Admitting: Internal Medicine

## 2019-08-29 DIAGNOSIS — H905 Unspecified sensorineural hearing loss: Secondary | ICD-10-CM | POA: Diagnosis not present

## 2019-09-14 ENCOUNTER — Other Ambulatory Visit (INDEPENDENT_AMBULATORY_CARE_PROVIDER_SITE_OTHER): Payer: Self-pay | Admitting: Orthopaedic Surgery

## 2019-09-23 ENCOUNTER — Other Ambulatory Visit: Payer: Self-pay | Admitting: Family Medicine

## 2019-09-23 ENCOUNTER — Other Ambulatory Visit: Payer: Self-pay | Admitting: Internal Medicine

## 2019-09-23 DIAGNOSIS — Z1231 Encounter for screening mammogram for malignant neoplasm of breast: Secondary | ICD-10-CM

## 2019-09-25 ENCOUNTER — Ambulatory Visit: Payer: Medicare Other

## 2019-10-15 ENCOUNTER — Other Ambulatory Visit (INDEPENDENT_AMBULATORY_CARE_PROVIDER_SITE_OTHER): Payer: Self-pay | Admitting: Orthopaedic Surgery

## 2019-11-05 ENCOUNTER — Other Ambulatory Visit: Payer: Self-pay | Admitting: Internal Medicine

## 2019-11-05 DIAGNOSIS — F341 Dysthymic disorder: Secondary | ICD-10-CM

## 2019-11-05 DIAGNOSIS — G4700 Insomnia, unspecified: Secondary | ICD-10-CM

## 2019-11-05 NOTE — Telephone Encounter (Signed)
Patient needs follow up with me please

## 2019-11-08 NOTE — Telephone Encounter (Signed)
F/u Appt sch for 11/21/2019.

## 2019-11-15 DIAGNOSIS — H53031 Strabismic amblyopia, right eye: Secondary | ICD-10-CM | POA: Diagnosis not present

## 2019-11-15 DIAGNOSIS — H10413 Chronic giant papillary conjunctivitis, bilateral: Secondary | ICD-10-CM | POA: Diagnosis not present

## 2019-11-15 DIAGNOSIS — H16223 Keratoconjunctivitis sicca, not specified as Sjogren's, bilateral: Secondary | ICD-10-CM | POA: Diagnosis not present

## 2019-11-15 DIAGNOSIS — Z961 Presence of intraocular lens: Secondary | ICD-10-CM | POA: Diagnosis not present

## 2019-11-21 ENCOUNTER — Other Ambulatory Visit: Payer: Self-pay

## 2019-11-21 ENCOUNTER — Encounter: Payer: Self-pay | Admitting: Internal Medicine

## 2019-11-21 ENCOUNTER — Ambulatory Visit (INDEPENDENT_AMBULATORY_CARE_PROVIDER_SITE_OTHER): Payer: Medicare HMO | Admitting: Internal Medicine

## 2019-11-21 VITALS — BP 143/56 | HR 76 | Temp 99.0°F | Ht 63.0 in | Wt 227.9 lb

## 2019-11-21 DIAGNOSIS — G4700 Insomnia, unspecified: Secondary | ICD-10-CM | POA: Diagnosis not present

## 2019-11-21 DIAGNOSIS — R600 Localized edema: Secondary | ICD-10-CM | POA: Insufficient documentation

## 2019-11-21 DIAGNOSIS — I10 Essential (primary) hypertension: Secondary | ICD-10-CM | POA: Diagnosis not present

## 2019-11-21 DIAGNOSIS — Z86718 Personal history of other venous thrombosis and embolism: Secondary | ICD-10-CM
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
## 2019-11-21 NOTE — Patient Instructions (Addendum)
It was a pleasure to see you today [name]. Please make the following changes:  -stop taking aspirin  -we have collected bloodwork from you today  -please follow up in 4-6 weeks to establish care with your new physician  -please limit carbohydrate intake and exercise daily -please elevate your legs and use compression stockings   If you have any questions or concerns, please call our clinic at [phone] between 9am-5pm and after hours call [phone] and ask for the internal medicine resident on call. If you feel you are having a medical emergency please call 911.   Thank you, we look forward to help you remain healthy!  [name], MD Internal Medicine PGY3

## 2019-11-21 NOTE — Assessment & Plan Note (Signed)
Patient states that she has been having bilateral lower extremity edema occasionally. She has not had any dyspnea or chest pain.   Assessment and plan  On examination the patient does not have any pitting. Stopped lasix 20mg  qd. Lower extremity edema is likely dependent secondary to obesity. Instructed her to wear compression stockings and elevate her feet. She should work on lifestyle modifications for weight reduction.
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
## 2019-11-21 NOTE — Assessment & Plan Note (Signed)
The patient's blood pressure during this visit was 143/56. The patient is currently taking lisinopril 20mg  qd. Her last blood pressure visits are   BP Readings from Last 3 Encounters:  11/21/19 (!) 143/56  02/19/19 (!) 142/43  08/07/18 (!) 128/48   The patient does not  report palpitations, dizziness, chest pain, sob.  Assessment and Plan Patient's blood pressure is relatively well controlled given her age. Will continue on current regimen.

## 2019-11-22 ENCOUNTER — Encounter: Payer: Self-pay | Admitting: *Deleted

## 2019-11-22 DIAGNOSIS — Z86718 Personal history of other venous thrombosis and embolism: Secondary | ICD-10-CM | POA: Insufficient documentation

## 2019-11-22 LAB — BMP8+ANION GAP
Anion Gap: 17 mmol/L (ref 10.0–18.0)
BUN/Creatinine Ratio: 9 — ABNORMAL LOW (ref 12–28)
BUN: 7 mg/dL — ABNORMAL LOW (ref 8–27)
CO2: 21 mmol/L (ref 20–29)
Calcium: 9.4 mg/dL (ref 8.7–10.3)
Chloride: 101 mmol/L (ref 96–106)
Creatinine, Ser: 0.74 mg/dL (ref 0.57–1.00)
GFR calc Af Amer: 93 mL/min/{1.73_m2} (ref >59)
GFR calc non Af Amer: 81 mL/min/{1.73_m2} (ref >59)
Glucose: 101 mg/dL — ABNORMAL HIGH (ref 65–99)
Potassium: 4.6 mmol/L (ref 3.5–5.2)
Sodium: 139 mmol/L (ref 134–144)

## 2019-11-22 LAB — CBC
Hematocrit: 40.1 % (ref 34.0–46.6)
Hemoglobin: 13.1 g/dL (ref 11.1–15.9)
MCH: 30.5 pg (ref 26.6–33.0)
MCHC: 32.7 g/dL (ref 31.5–35.7)
MCV: 93 fL (ref 79–97)
Platelets: 281 10*3/uL (ref 150–450)
RBC: 4.3 x10E6/uL (ref 3.77–5.28)
RDW: 13.3 % (ref 11.7–15.4)
WBC: 7.4 10*3/uL (ref 3.4–10.8)

## 2019-11-22 NOTE — Progress Notes (Signed)
Internal Medicine Clinic Attending  Case discussed with Dr. Chundi at the time of the visit.  We reviewed the resident's history and exam and pertinent patient test results.  I agree with the assessment, diagnosis, and plan of care documented in the resident's note. 
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
## 2019-12-02 ENCOUNTER — Encounter: Payer: Self-pay | Admitting: *Deleted

## 2019-12-03 ENCOUNTER — Other Ambulatory Visit: Payer: Self-pay | Admitting: Internal Medicine

## 2019-12-03 DIAGNOSIS — G4700 Insomnia, unspecified: Secondary | ICD-10-CM

## 2019-12-03 DIAGNOSIS — F341 Dysthymic disorder: Secondary | ICD-10-CM

## 2019-12-04 ENCOUNTER — Other Ambulatory Visit: Payer: Self-pay | Admitting: Internal Medicine

## 2019-12-04 NOTE — Telephone Encounter (Signed)
Stopped recently by Dr. Maricela Bo. If she develops leg swelling, should be re-evaluated in clinic.

## 2019-12-09 ENCOUNTER — Other Ambulatory Visit: Payer: Self-pay | Admitting: Internal Medicine
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
## 2020-01-10 ENCOUNTER — Encounter: Payer: Self-pay | Admitting: Internal Medicine

## 2020-01-10 ENCOUNTER — Other Ambulatory Visit: Payer: Self-pay

## 2020-01-10 ENCOUNTER — Ambulatory Visit (INDEPENDENT_AMBULATORY_CARE_PROVIDER_SITE_OTHER): Payer: Medicare Other | Admitting: Internal Medicine

## 2020-01-10 VITALS — BP 130/60 | HR 62 | Temp 98.6°F | Wt 229.1 lb

## 2020-01-10 DIAGNOSIS — M19042 Primary osteoarthritis, left hand: Secondary | ICD-10-CM | POA: Diagnosis not present

## 2020-01-10 DIAGNOSIS — Z1211 Encounter for screening for malignant neoplasm of colon: Secondary | ICD-10-CM

## 2020-01-10 DIAGNOSIS — M818 Other osteoporosis without current pathological fracture: Secondary | ICD-10-CM | POA: Diagnosis not present

## 2020-01-10 DIAGNOSIS — I10 Essential (primary) hypertension: Secondary | ICD-10-CM | POA: Diagnosis not present

## 2020-01-10 DIAGNOSIS — Z13820 Encounter for screening for osteoporosis: Secondary | ICD-10-CM | POA: Diagnosis not present

## 2020-01-10 DIAGNOSIS — M810 Age-related osteoporosis without current pathological fracture: Secondary | ICD-10-CM

## 2020-01-10 DIAGNOSIS — R600 Localized edema: Secondary | ICD-10-CM | POA: Diagnosis not present

## 2020-01-10 DIAGNOSIS — M19041 Primary osteoarthritis, right hand: Secondary | ICD-10-CM | POA: Diagnosis not present

## 2020-01-10 DIAGNOSIS — F5104 Psychophysiologic insomnia: Secondary | ICD-10-CM

## 2020-01-10 DIAGNOSIS — G4700 Insomnia, unspecified: Secondary | ICD-10-CM

## 2020-01-10 DIAGNOSIS — F341 Dysthymic disorder: Secondary | ICD-10-CM

## 2020-01-10 MED ORDER — ALENDRONATE SODIUM 70 MG PO TABS: ORAL_TABLET | ORAL | 1 refills | Status: DC

## 2020-01-10 MED ORDER — CALCIUM CARBONATE-VITAMIN D 500-200 MG-UNIT PO TABS: 2.0000 | ORAL_TABLET | Freq: Every day | ORAL | 2 refills | Status: AC

## 2020-01-10 MED ORDER — OMEPRAZOLE 20 MG PO CPDR: 20.0000 mg | DELAYED_RELEASE_CAPSULE | Freq: Every day | ORAL | 1 refills | Status: DC

## 2020-01-10 MED ORDER — FUROSEMIDE 20 MG PO TABS
20.0000 mg | ORAL_TABLET | Freq: Every day | ORAL | 5 refills | Status: DC
Start: 2020-01-10 — End: 2020-12-08

## 2020-01-10 MED ORDER — ALPRAZOLAM 1 MG PO TABS: ORAL_TABLET | ORAL | 1 refills | Status: DC

## 2020-01-10 MED ORDER — LISINOPRIL 20 MG PO TABS: 20.0000 mg | ORAL_TABLET | Freq: Every day | ORAL | 3 refills | Status: DC

## 2020-01-10 NOTE — Assessment & Plan Note (Signed)
Patient is currently on lisinopril 20 mg daily, denies any issues taking her medications. She does report a lot of stress in her life, reports that multiple family members and neighbors have been dealing with cancer and she is having a lot of stress from dealing with the social aspect of that. Her blood pressure today is elevated at 149/65, repeat was 130/60. She was asking to restart her Lasix for her lower extremity swelling. This may help with her blood pressure as well. We will restart that and see how her blood pressure does over the next few weeks. -Continue lisinopril 20 mg daily -Start Lasix 20 mg daily -Check BMP -RTC in 2 to 3 weeks

## 2020-01-10 NOTE — Progress Notes (Signed)
Internal Medicine Clinic Attending ° °Case discussed with Dr. Krienke  At the time of the visit.  We reviewed the resident’s history and exam and pertinent patient test results.  I agree with the assessment, diagnosis, and plan of care documented in the resident’s note.  °

## 2020-01-10 NOTE — Assessment & Plan Note (Signed)
Patient has been on Xanax 1 mg daily for over 10 years now, she reports that this helps with her sleep and that she is able to sleep about 6 hours with the pill. She also reports that she has nightmares if she does not take this medication. She had tried to be tapered off of this medication previously however did not tolerate that. Given the long course of benzodiazepine use it is unlikely that she will be able to be tapered off of this. Renewed her substance contract today.  -Continue Xanax 1 mg nightly -Updated pain contract -Consider repeating UDS on next visit

## 2020-01-10 NOTE — Assessment & Plan Note (Signed)
Patient has a history of osteoporosis given her fracture after a mechanical fall. She is currently on vitamin D and alendronate. She denies any issues taking his medications. However she was wondering why she was taking the vitamin D. Discussed that we will repeat levels today and if vitamin D is normal would recommend continuing it. -Refill Fosamax and vitamin D -check vitamin D level

## 2020-01-10 NOTE — Patient Instructions (Signed)
[name],  It was a pleasure to see you today. Thank you for coming in.   Today we discussed your hypertension. In regards to this please continue taking the lisinopril 20 mg daily. I have restarted the Lasix daily. We ware checking some labs today. Please return in about 3 weeks to repeat your blood pressure.   We also discussed your sleeping issues. Please continue taking the Xanax as prescribed.   We are checking some labs and will contact you if they are abnormal.   Please return to clinic in 3 weeks or sooner if needed.   Thank you again for coming in.   [name]

## 2020-01-10 NOTE — Assessment & Plan Note (Signed)
Patient has been having this bilateral lower extremity swelling since she stopped taking the Lasix 20 mg every other day. This was stopped because it seems like her thigh extremity swelling may be more from venous insufficiency due to the nonpitting nature. She denies any chest pain, shortness of breath, palpitations, orthopnea, PND, or other cardiac symptoms. On exam today her cardiac and pulmonary exam are unremarkable, she does have 1+ bilateral pitting edema up to the midshin. Overall she does not have any symptoms of heart failure at this time however since she had previously been on Lasix with good control for lower extremity swelling, resume this for now.  -Restart Lasix 20 mg daily -Obtain BMP -RTC in 2 to 3 weeks to repeat labs

## 2020-01-11 LAB — BMP8+ANION GAP
Anion Gap: 16 mmol/L (ref 10.0–18.0)
BUN/Creatinine Ratio: 12 (ref 12–28)
BUN: 9 mg/dL (ref 8–27)
CO2: 21 mmol/L (ref 20–29)
Calcium: 9.3 mg/dL (ref 8.7–10.3)
Chloride: 100 mmol/L (ref 96–106)
Creatinine, Ser: 0.74 mg/dL (ref 0.57–1.00)
GFR calc Af Amer: 93 mL/min/{1.73_m2} (ref >59)
GFR calc non Af Amer: 81 mL/min/{1.73_m2} (ref >59)
Glucose: 106 mg/dL — ABNORMAL HIGH (ref 65–99)
Potassium: 4.4 mmol/L (ref 3.5–5.2)
Sodium: 137 mmol/L (ref 134–144)

## 2020-01-11 LAB — VITAMIN D 25 HYDROXY (VIT D DEFICIENCY, FRACTURES): Vit D, 25-Hydroxy: 16.2 ng/mL — ABNORMAL LOW (ref 30.0–100.0)

## 2020-01-12 ENCOUNTER — Other Ambulatory Visit (INDEPENDENT_AMBULATORY_CARE_PROVIDER_SITE_OTHER): Payer: Self-pay | Admitting: Orthopaedic Surgery

## 2020-01-13 ENCOUNTER — Other Ambulatory Visit: Payer: Self-pay

## 2020-01-13 MED ORDER — NAPROXEN 500 MG PO TABS: 500.0000 mg | ORAL_TABLET | Freq: Two times a day (BID) | ORAL | 0 refills | Status: DC

## 2020-01-15 ENCOUNTER — Other Ambulatory Visit (INDEPENDENT_AMBULATORY_CARE_PROVIDER_SITE_OTHER): Payer: Self-pay | Admitting: Orthopaedic Surgery

## 2020-01-16 ENCOUNTER — Telehealth: Payer: Self-pay | Admitting: Internal Medicine

## 2020-01-16 MED ORDER — VITAMIN D (ERGOCALCIFEROL) 1.25 MG (50000 UNIT) PO CAPS: 50000.0000 [IU] | ORAL_CAPSULE | ORAL | 0 refills | Status: AC

## 2020-01-16 NOTE — Telephone Encounter (Signed)
I called Ms. Sciortino to discuss her blood work.  Confirmed identity with 2 identifiers.    Ms. Mato's vitamin D Level is low at 66.  We discussed this and the need for higher dose supplementation.  She is willing to try this as long as it does not make her gain weight.   I will send in an Rx for ergocalciferol 50,000 units weekly for 8 weeks.   Will defer to Dr. Sherry Ruffing to recheck in the future.   Gilles Chiquito, MD

## 2020-02-04 ENCOUNTER — Encounter: Payer: Self-pay | Admitting: Internal Medicine

## 2020-02-04 ENCOUNTER — Ambulatory Visit (INDEPENDENT_AMBULATORY_CARE_PROVIDER_SITE_OTHER): Payer: Medicare Other | Admitting: Internal Medicine

## 2020-02-04 ENCOUNTER — Other Ambulatory Visit: Payer: Self-pay

## 2020-02-04 VITALS — BP 125/48 | HR 82 | Temp 98.2°F | Ht 63.0 in | Wt 226.3 lb

## 2020-02-04 DIAGNOSIS — Z79899 Other long term (current) drug therapy: Secondary | ICD-10-CM | POA: Diagnosis not present

## 2020-02-04 DIAGNOSIS — I10 Essential (primary) hypertension: Secondary | ICD-10-CM | POA: Diagnosis not present

## 2020-02-04 DIAGNOSIS — G4700 Insomnia, unspecified: Secondary | ICD-10-CM | POA: Diagnosis not present

## 2020-02-04 DIAGNOSIS — Z1211 Encounter for screening for malignant neoplasm of colon: Secondary | ICD-10-CM | POA: Diagnosis not present

## 2020-02-04 DIAGNOSIS — F5104 Psychophysiologic insomnia: Secondary | ICD-10-CM | POA: Diagnosis not present

## 2020-02-04 DIAGNOSIS — M7989 Other specified soft tissue disorders: Secondary | ICD-10-CM | POA: Diagnosis not present

## 2020-02-04 DIAGNOSIS — F341 Dysthymic disorder: Secondary | ICD-10-CM
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
## 2020-02-04 NOTE — Patient Instructions (Addendum)
[name],  It was a pleasure to see you today. Thank you for coming in.   Today we discussed your blood pressure. In regards to this please start checking your blood pressures daily at home, if the top number is persistently >140 please contact us. Please continue the lasix and lisinopril. Start trying the DASH diet. I am checking some labs and will contact you if they are abnormal.  Please return to clinic in 3 months or sooner if needed.   Thank you again for coming in.   [name]

## 2020-02-05 LAB — BMP8+ANION GAP
Anion Gap: 16 mmol/L (ref 10.0–18.0)
BUN/Creatinine Ratio: 11 — ABNORMAL LOW (ref 12–28)
BUN: 8 mg/dL (ref 8–27)
CO2: 21 mmol/L (ref 20–29)
Calcium: 9.3 mg/dL (ref 8.7–10.3)
Chloride: 99 mmol/L (ref 96–106)
Creatinine, Ser: 0.73 mg/dL (ref 0.57–1.00)
GFR calc Af Amer: 94 mL/min/{1.73_m2} (ref >59)
GFR calc non Af Amer: 82 mL/min/{1.73_m2} (ref >59)
Glucose: 106 mg/dL — ABNORMAL HIGH (ref 65–99)
Potassium: 4.1 mmol/L (ref 3.5–5.2)
Sodium: 136 mmol/L (ref 134–144)

## 2020-02-05 NOTE — Assessment & Plan Note (Signed)
Patient is currently on lisinopril 20 mg daily we had added Lasix 20 mg daily on her last visit due to concerns of lower extremity swelling and request to be restarted on this.  She denies any issues taking her medications.  She does check her blood pressures at home are around 130/60s.  She denies any headaches, lightheadedness, dizziness, fatigue, chest pain, shortness of breath, nausea, or vision changes.  Blood pressure today was initially elevated at 144/68, repeat was 125/48.  She had also recently been given a prescription for naproxen which could have contributed to increased pressure initially.  Blood pressure is well controlled on repeat.  No changes at this time.  -Continue lisinopril 20 mg daily -Continue Lasix 20 mg daily -Repeat BMP today -Advised to continue to monitor blood pressures at home

## 2020-02-05 NOTE — Assessment & Plan Note (Addendum)
Patient is currently on Xanax 1 mg nightly for her insomnia. Chronic and stable. She is able to sleep about 6 hours when she takes this.  Prior taper has not been successful.  Had an updated pain contract on her last visit.  We will need to monitor this closely in a 74 year old female given the risk of sedation and falls.  -UDS today -Continue Xanax 1 mg nightly -Assess for nocturia on next visit

## 2020-02-06 LAB — FECAL OCCULT BLOOD, IMMUNOCHEMICAL: Fecal Occult Bld: NEGATIVE

## 2020-02-07 LAB — TOXASSURE SELECT,+ANTIDEPR,UR

## 2020-02-09 ENCOUNTER — Other Ambulatory Visit: Payer: Self-pay | Admitting: Internal Medicine

## 2020-02-12 NOTE — Progress Notes (Signed)
DOS 02/07/20:  Internal Medicine Clinic Attending  Case discussed with Dr. Sherry Ruffing  At the time of the visit.  We reviewed the resident's history and exam and pertinent patient test results.  I agree with the assessment, diagnosis, and plan of care documented in the resident's note.

## 2020-03-16 ENCOUNTER — Telehealth: Payer: Self-pay | Admitting: Internal Medicine

## 2020-03-16 NOTE — Telephone Encounter (Signed)
Pls contact pharmacy; pt is  Having trouble getting sleeping medicine due to its an resident  [place], [place] - [address]

## 2020-03-16 NOTE — Telephone Encounter (Signed)
Spoke with Tomoko at [facility]. States they already fixed the problem with the xanax and have made patient aware. Nothing further needed. Hubbard Hartshorn, BSN, RN-BC

## 2020-04-08 ENCOUNTER — Ambulatory Visit: Payer: Medicare Other | Admitting: Physician Assistant

## 2020-06-01 ENCOUNTER — Other Ambulatory Visit (INDEPENDENT_AMBULATORY_CARE_PROVIDER_SITE_OTHER): Payer: Self-pay | Admitting: Physician Assistant

## 2020-06-02 ENCOUNTER — Other Ambulatory Visit: Payer: Self-pay

## 2020-06-02 ENCOUNTER — Ambulatory Visit
Admission: RE | Admit: 2020-06-02 | Discharge: 2020-06-02 | Disposition: A | Discharge status: Home / Self Care | Payer: Medicare Other | Source: Ambulatory Visit | Attending: Family Medicine | Admitting: Family Medicine

## 2020-06-02 DIAGNOSIS — Z1231 Encounter for screening mammogram for malignant neoplasm of breast: Secondary | ICD-10-CM | POA: Diagnosis not present

## 2020-06-04 ENCOUNTER — Ambulatory Visit: Payer: Medicare Other | Admitting: Family Medicine

## 2020-06-17 ENCOUNTER — Ambulatory Visit: Payer: Self-pay

## 2020-06-17 ENCOUNTER — Ambulatory Visit (INDEPENDENT_AMBULATORY_CARE_PROVIDER_SITE_OTHER): Payer: Medicare Other | Admitting: Physician Assistant

## 2020-06-17 ENCOUNTER — Encounter: Payer: Self-pay | Admitting: Physician Assistant

## 2020-06-17 DIAGNOSIS — M79644 Pain in right finger(s): Secondary | ICD-10-CM | POA: Diagnosis not present

## 2020-06-17 DIAGNOSIS — M2041 Other hammer toe(s) (acquired), right foot: Secondary | ICD-10-CM

## 2020-06-17 DIAGNOSIS — M25551 Pain in right hip: Secondary | ICD-10-CM | POA: Diagnosis not present
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
## 2020-06-18 ENCOUNTER — Ambulatory Visit: Payer: Medicare Other | Admitting: Family Medicine

## 2020-07-02 ENCOUNTER — Other Ambulatory Visit: Payer: Self-pay | Admitting: Internal Medicine

## 2020-07-02 ENCOUNTER — Other Ambulatory Visit (INDEPENDENT_AMBULATORY_CARE_PROVIDER_SITE_OTHER): Payer: Self-pay | Admitting: Orthopaedic Surgery

## 2020-07-02 DIAGNOSIS — M19041 Primary osteoarthritis, right hand: Secondary | ICD-10-CM

## 2020-07-06 NOTE — Telephone Encounter (Signed)
Please schedule a follow-up appointment for her

## 2020-07-10 ENCOUNTER — Ambulatory Visit: Payer: Medicare Other | Admitting: Family Medicine

## 2020-07-12 ENCOUNTER — Other Ambulatory Visit: Payer: Self-pay | Admitting: Internal Medicine

## 2020-07-12 DIAGNOSIS — F341 Dysthymic disorder: Secondary | ICD-10-CM

## 2020-07-12 DIAGNOSIS — G4700 Insomnia, unspecified: Secondary | ICD-10-CM

## 2020-07-14 ENCOUNTER — Other Ambulatory Visit: Payer: Self-pay

## 2020-07-14 ENCOUNTER — Ambulatory Visit: Payer: Medicare Other | Admitting: Family Medicine

## 2020-07-14 ENCOUNTER — Encounter: Payer: Self-pay | Admitting: Student

## 2020-07-14 ENCOUNTER — Ambulatory Visit (INDEPENDENT_AMBULATORY_CARE_PROVIDER_SITE_OTHER): Payer: Medicare Other | Admitting: Student

## 2020-07-14 DIAGNOSIS — I10 Essential (primary) hypertension: Secondary | ICD-10-CM | POA: Diagnosis not present

## 2020-07-14 DIAGNOSIS — M542 Cervicalgia: Secondary | ICD-10-CM | POA: Insufficient documentation

## 2020-07-14 NOTE — Assessment & Plan Note (Signed)
Vitals:   07/14/20 1413  BP: 137/61  Pulse: 93  Temp: 97.9 F (36.6 C)  SpO2: 99%   Patient with history of HTN, well controlled on lisinopril 20mg  daily and lasix 20mg  daily.  -continue current meds

## 2020-07-14 NOTE — Patient Instructions (Signed)
Ms Lori Bradford,  It was a pleasure seeing you in the clinic today.   We discussed the following today:  1. Please do the neck stretching exercises (see below).  2. Please take flexeril as directed for your neck pain.  Please call our clinic at [phone] if you have any questions or concerns. The best time to call is Monday-Friday from 9am-4pm, but there is someone available 24/7 at the same number. If you need medication refills, please notify your pharmacy one week in advance and they will send [place] a request.   Thank you for letting us take part in your care. We look forward to seeing you next time!   Neck Exercises Ask your health care provider which exercises are safe for you. Do exercises exactly as told by your health care provider and adjust them as directed. It is normal to feel mild stretching, pulling, tightness, or discomfort as you do these exercises. Stop right away if you feel sudden pain or your pain gets worse. Do not begin these exercises until told by your health care provider. Neck exercises can be important for many reasons. They can improve strength and maintain flexibility in your neck, which will help your upper back and prevent neck pain. Stretching exercises Rotation neck stretching 1. Sit in a chair or stand up. 2. Place your feet flat on the floor, shoulder width apart. 3. Slowly turn your head (rotate) to the right until a slight stretch is felt. Turn it all the way to the right so you can look over your right shoulder. Do not tilt or tip your head. 4. Hold this position for 10-30 seconds. 5. Slowly turn your head (rotate) to the left until a slight stretch is felt. Turn it all the way to the left so you can look over your left shoulder. Do not tilt or tip your head. 6. Hold this position for 10-30 seconds. Repeat __________ times. Complete this exercise __________ times a day.   Neck retraction 1. Sit in a sturdy chair or stand up. 2. Look straight ahead. Do not  bend your neck. 3. Use your fingers to push your chin backward (retraction). Do not bend your neck for this movement. Continue to face straight ahead. If you are doing the exercise properly, you will feel a slight sensation in your throat and a stretch at the back of your neck. 4. Hold the stretch for 1-2 seconds. Repeat __________ times. Complete this exercise __________ times a day. Strengthening exercises Neck press 1. Lie on your back on a firm bed or on the floor with a pillow under your head. 2. Use your neck muscles to push your head down on the pillow and straighten your spine. 3. Hold the position as well as you can. Keep your head facing up (in a neutral position) and your chin tucked. 4. Slowly count to 5 while holding this position. Repeat __________ times. Complete this exercise __________ times a day. Isometrics These are exercises in which you strengthen the muscles in your neck while keeping your neck still (isometrics). 1. Sit in a supportive chair and place your hand on your forehead. 2. Keep your head and face facing straight ahead. Do not flex or extend your neck while doing isometrics. 3. Push forward with your head and neck while pushing back with your hand. Hold for 10 seconds. 4. Do the sequence again, this time putting your hand against the back of your head. Use your head and neck to push backward against  the hand pressure. 5. Finally, do the same exercise on either side of your head, pushing sideways against the pressure of your hand. Repeat __________ times. Complete this exercise __________ times a day. Prone head lifts 1. Lie face-down (prone position), resting on your elbows so that your chest and upper back are raised. 2. Start with your head facing downward, near your chest. Position your chin either on or near your chest. 3. Slowly lift your head upward. Lift until you are looking straight ahead. Then continue lifting your head as far back as you can  comfortably stretch. 4. Hold your head up for 5 seconds. Then slowly lower it to your starting position. Repeat __________ times. Complete this exercise __________ times a day. Supine head lifts 1. Lie on your back (supine position), bending your knees to point to the ceiling and keeping your feet flat on the floor. 2. Lift your head slowly off the floor, raising your chin toward your chest. 3. Hold for 5 seconds. Repeat __________ times. Complete this exercise __________ times a day. Scapular retraction 1. Stand with your arms at your sides. Look straight ahead. 2. Slowly pull both shoulders (scapulae) backward and downward (retraction) until you feel a stretch between your shoulder blades in your upper back. 3. Hold for 10-30 seconds. 4. Relax and repeat. Repeat __________ times. Complete this exercise __________ times a day. Contact a health care provider if:  Your neck pain or discomfort gets much worse when you do an exercise.  Your neck pain or discomfort does not improve within 2 hours after you exercise. If you have any of these problems, stop exercising right away. Do not do the exercises again unless your health care provider says that you can. Get help right away if:  You develop sudden, severe neck pain. If this happens, stop exercising right away. Do not do the exercises again unless your health care provider says that you can. This information is not intended to replace advice given to you by your health care provider. Make sure you discuss any questions you have with your health care provider. Document Revised: 03/21/2018 Document Reviewed: 03/21/2018 Elsevier Patient Education  2021 [employer].
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
## 2020-07-15 ENCOUNTER — Telehealth: Payer: Self-pay | Admitting: Internal Medicine

## 2020-07-15 NOTE — Telephone Encounter (Signed)
I called [facility], talked to Lori Bradford about Alprazolam - she stated it has been taken care of, it's almost ready for pick up and she has already called/talked to Ms Trine.

## 2020-07-15 NOTE — Progress Notes (Signed)
Internal Medicine Clinic Attending  Case discussed with Dr. Jinwala  At the time of the visit.  We reviewed the resident's history and exam and pertinent patient test results.  I agree with the assessment, diagnosis, and plan of care documented in the resident's note.  

## 2020-07-15 NOTE — Telephone Encounter (Signed)
Refill Request-   Pt requesting a call back.  Patient states she has not been able to sleep and needs her medication.   ALPRAZolam (XANAX) 1 MG tablet  [facility], [place] - [id number]

## 2020-07-15 NOTE — Telephone Encounter (Signed)
TC to patient who states she is unable to get her Xanax RX because the pharmacy is telling her a PA sent it in and they need a MD to send RX in.  RN informed patient [facility] does not have any PA's, only MD's and RX was sent by Dr. Wynetta Emery.  TC to pharmacy, spoke with Lilia Pro, she states the RX has now gone through. SChaplin, RN,BSN

## 2020-07-16 ENCOUNTER — Ambulatory Visit: Payer: Medicare Other | Admitting: Family Medicine

## 2020-08-12 ENCOUNTER — Other Ambulatory Visit: Payer: Self-pay | Admitting: Student

## 2020-08-12 DIAGNOSIS — F341 Dysthymic disorder: Secondary | ICD-10-CM

## 2020-08-12 DIAGNOSIS — G4700 Insomnia, unspecified: Secondary | ICD-10-CM

## 2020-08-21 ENCOUNTER — Other Ambulatory Visit: Payer: Self-pay

## 2020-08-21 ENCOUNTER — Ambulatory Visit (INDEPENDENT_AMBULATORY_CARE_PROVIDER_SITE_OTHER): Payer: Medicare Other | Admitting: Family Medicine

## 2020-08-21 VITALS — BP 140/70 | Ht 63.0 in | Wt 219.0 lb

## 2020-08-21 DIAGNOSIS — M542 Cervicalgia: Secondary | ICD-10-CM | POA: Diagnosis not present

## 2020-08-21 DIAGNOSIS — L304 Erythema intertrigo: Secondary | ICD-10-CM

## 2020-08-21 MED ORDER — METHYLPREDNISOLONE ACETATE 40 MG/ML IJ SUSP
40.0000 mg | Freq: Once | INTRAMUSCULAR | Status: AC
  Administered 2020-08-21: 40 mg via INTRA_ARTICULAR

## 2020-08-21 MED ORDER — CLOTRIMAZOLE-BETAMETHASONE 1-0.05 % EX CREA: TOPICAL_CREAM | CUTANEOUS | 1 refills | Status: DC

## 2020-08-21 MED ORDER — TRAMADOL HCL 50 MG PO TABS: 50.0000 mg | ORAL_TABLET | Freq: Two times a day (BID) | ORAL | 1 refills | Status: DC | PRN

## 2020-08-21 NOTE — Patient Instructions (Signed)
Lets try this trigger point injection today. O have also called in some mild pain medicine. OK to use one twice a day. See me in 3-5 weeks. Call sooner if it gets worse or something new pops up.

## 2020-08-21 NOTE — Assessment & Plan Note (Signed)
Neck spasm Will treat her pain with tramadol and acetaminophen. We did trigger point injection today Heat/ice at home F/u 3 weeks If no better, probably need updated neck films as she has significant OA history No worrisome neurological signs

## 2020-08-21 NOTE — Progress Notes (Signed)
  TONIE ELSEY - 75 y.o. female MRN [id number]  Date of birth: November 07, 1945    SUBJECTIVE:      Chief Complaint:/ HPI:  R sided neck pain Started several days ago, no specific injury. Feels tight. Has a lot of chronic popping and creaking of her neck anyway and had increased episodes in last few days. No numbness or weakness of upper extremity.  2. Skin rash under breat area, red, itchy. Been there several months. Aggravating. No fevers.    OBJECTIVE: BP 140/70   Ht 5\' 3"  (1.6 m)   Wt 219 lb (99.3 kg)   BMI 38.79 kg/m   Physical Exam:  Vital signs are reviewed. GEN WD WN NAD NECK right sided muscle spasm with mild ttp and some trapezius tendernss. Pain limits lateral rotation but otherwise has full flexion and extension. Negative spurlings UE strength 5/5 symmetrical at C4-5-6. SKin erythematous area un breast creases. No satellite lesions. No induration, n o unusual warmth  ASSESSMENT & PLAN:  See problem based charting & AVS for pt instructions. 1. Neck pain  Neck spasm Will treat her pain with tramadol and acetaminophen. We did trigger point injection today Heat/ice at home F/u 3 weeks If no better, probably need updated neck films as she has significant OA history No worrisome neurological signs  PROCEDURE: INJECTION: Patient was given informed consent, signed copy in the chart. Appropriate time out was taken. Area prepped and draped in usual sterile fashion. Ethyl chloride was  used for local anesthesia. A 21 gauge 1 1/2 inch needle was used.. 1/2 cc of methylprednisolone 40 mg/ml plus  1/2 cc of 1% lidocaine without epinephrine was injected into the right SCM muscle using a(n) shallow approach.   The patient tolerated the procedure well. There were no complications. Post procedure instructions were given.  2. Candidal intertrigo: nystatin ointment and f/u 3 weeks

## 2020-08-26 ENCOUNTER — Other Ambulatory Visit: Payer: Self-pay | Admitting: Internal Medicine

## 2020-08-26 ENCOUNTER — Other Ambulatory Visit (INDEPENDENT_AMBULATORY_CARE_PROVIDER_SITE_OTHER): Payer: Self-pay | Admitting: Orthopaedic Surgery

## 2020-08-26 DIAGNOSIS — F341 Dysthymic disorder: Secondary | ICD-10-CM

## 2020-08-26 DIAGNOSIS — G4700 Insomnia, unspecified: Secondary | ICD-10-CM

## 2020-08-26 DIAGNOSIS — M19041 Primary osteoarthritis, right hand: Secondary | ICD-10-CM

## 2020-08-28 ENCOUNTER — Ambulatory Visit: Payer: Medicare Other | Admitting: Family Medicine

## 2020-09-01 DIAGNOSIS — M47812 Spondylosis without myelopathy or radiculopathy, cervical region: Secondary | ICD-10-CM | POA: Diagnosis not present

## 2020-09-01 DIAGNOSIS — M50323 Other cervical disc degeneration at C6-C7 level: Secondary | ICD-10-CM | POA: Diagnosis not present

## 2020-09-01 DIAGNOSIS — M9901 Segmental and somatic dysfunction of cervical region: Secondary | ICD-10-CM | POA: Diagnosis not present

## 2020-09-02 DIAGNOSIS — M47812 Spondylosis without myelopathy or radiculopathy, cervical region: Secondary | ICD-10-CM | POA: Diagnosis not present

## 2020-09-02 DIAGNOSIS — M9901 Segmental and somatic dysfunction of cervical region: Secondary | ICD-10-CM | POA: Diagnosis not present

## 2020-09-02 DIAGNOSIS — M50323 Other cervical disc degeneration at C6-C7 level: Secondary | ICD-10-CM | POA: Diagnosis not present

## 2020-09-03 DIAGNOSIS — M9901 Segmental and somatic dysfunction of cervical region: Secondary | ICD-10-CM | POA: Diagnosis not present

## 2020-09-03 DIAGNOSIS — M50323 Other cervical disc degeneration at C6-C7 level: Secondary | ICD-10-CM | POA: Diagnosis not present

## 2020-09-03 DIAGNOSIS — M47812 Spondylosis without myelopathy or radiculopathy, cervical region: Secondary | ICD-10-CM | POA: Diagnosis not present

## 2020-09-04 ENCOUNTER — Encounter: Payer: Self-pay | Admitting: Internal Medicine

## 2020-09-07 DIAGNOSIS — M9901 Segmental and somatic dysfunction of cervical region: Secondary | ICD-10-CM | POA: Diagnosis not present

## 2020-09-07 DIAGNOSIS — M50323 Other cervical disc degeneration at C6-C7 level: Secondary | ICD-10-CM | POA: Diagnosis not present

## 2020-09-07 DIAGNOSIS — M47812 Spondylosis without myelopathy or radiculopathy, cervical region: Secondary | ICD-10-CM | POA: Diagnosis not present

## 2020-09-08 DIAGNOSIS — M50323 Other cervical disc degeneration at C6-C7 level: Secondary | ICD-10-CM | POA: Diagnosis not present

## 2020-09-08 DIAGNOSIS — M9901 Segmental and somatic dysfunction of cervical region: Secondary | ICD-10-CM | POA: Diagnosis not present

## 2020-09-08 DIAGNOSIS — M47812 Spondylosis without myelopathy or radiculopathy, cervical region: Secondary | ICD-10-CM | POA: Diagnosis not present

## 2020-09-10 ENCOUNTER — Other Ambulatory Visit: Payer: Self-pay | Admitting: Internal Medicine

## 2020-09-10 DIAGNOSIS — G4700 Insomnia, unspecified: Secondary | ICD-10-CM

## 2020-09-10 DIAGNOSIS — M9901 Segmental and somatic dysfunction of cervical region: Secondary | ICD-10-CM | POA: Diagnosis not present

## 2020-09-10 DIAGNOSIS — F341 Dysthymic disorder: Secondary | ICD-10-CM

## 2020-09-10 DIAGNOSIS — M50323 Other cervical disc degeneration at C6-C7 level: Secondary | ICD-10-CM | POA: Diagnosis not present

## 2020-09-10 DIAGNOSIS — M47812 Spondylosis without myelopathy or radiculopathy, cervical region: Secondary | ICD-10-CM | POA: Diagnosis not present

## 2020-09-11 ENCOUNTER — Other Ambulatory Visit: Payer: Self-pay

## 2020-09-11 ENCOUNTER — Ambulatory Visit (INDEPENDENT_AMBULATORY_CARE_PROVIDER_SITE_OTHER): Payer: Medicare Other | Admitting: Family Medicine

## 2020-09-11 VITALS — BP 138/60 | Ht 63.0 in

## 2020-09-11 DIAGNOSIS — R42 Dizziness and giddiness: Secondary | ICD-10-CM | POA: Diagnosis not present

## 2020-09-11 DIAGNOSIS — G4459 Other complicated headache syndrome: Secondary | ICD-10-CM | POA: Diagnosis not present

## 2020-09-11 NOTE — Telephone Encounter (Signed)
Patient will need a follow up appt to check a toxscreen.

## 2020-09-11 NOTE — Assessment & Plan Note (Signed)
No improvement in the headache portion of her neck and head pain.  In fact she now is having increased frequency and additional symptoms which consist of some dizziness, some visual symptoms and blurred vision.  She is worried about TIA or stroke and I do think it is on the differential.  She does not want to be referred to a neurologist would rather start the work-up here so we will start with MRI of the brain.  I will follow up with her after we get the results of that.  She will call in the interim if she has any new or worsening symptoms.

## 2020-09-11 NOTE — Progress Notes (Signed)
  Lori Bradford - 75 y.o. female MRN [id number]  Date of birth: [birth year]    SUBJECTIVE:      Chief Complaint:/ HPI:  Follow-up headache, neck pain, trapezius pain.  The trigger point injection significantly helped the neck pain in the trapezius pain.  She continues to have and is actually having increased frequency of her headache symptoms with some associated blurred vision now.  Headache is at the crown of her head and it feels like a bruise.  It comes on suddenly last for minutes and then resolved.  In the last 1 to 2 weeks its been associated frequently with blurred vision particularly on the right.  She also feels quite "funny" when this happens and is very concerned about it.  She is not sure if it is some" type of stroke or TIA" but says it is getting worse in general.    OBJECTIVE: BP 138/60   Ht 5\' 3"  (1.6 m)   BMI 38.79 kg/m   Physical Exam:  Vital signs are reviewed. GENERAL: Well-developed female no acute distress  NECK: Range of motion is somewhat limited in flexion extension secondary to arthritis but she does not have any pain from that today.  Lateral rotation also limited but painless.  Negative Spurling's test TRAPEZIUS: No tenderness to palpation.  Symmetrical. NEURO: Pupils equal round reactive to light and accommodation.  Extraocular muscles intact.  Upper extremity strength C4-5 is symmetrically intact.  She has 1-2+ reflexes at the elbow and forearm.  Grip strength is intact and symmetrical.  She has intact sensation to soft touch bilaterally in the upper extremity. Gait: A little unsteady when she first starts out and is a little bit antalgic. She can get up on and off the exam table without any assistance whatsoever. PSYCH: AxOx4. Good eye contact.. No psychomotor retardation or agitation. Appropriate speech fluency and content. Asks and answers questions appropriately. Mood is congruent.   ASSESSMENT & PLAN:  See problem based charting & AVS for pt  instructions. Other complicated headache syndrome No improvement in the headache portion of her neck and head pain.  In fact she now is having increased frequency and additional symptoms which consist of some dizziness, some visual symptoms and blurred vision.  She is worried about TIA or stroke and I do think it is on the differential.  She does not want to be referred to a neurologist would rather start the work-up here so we will start with MRI of the brain.  I will follow up with her after we get the results of that.  She will call in the interim if she has any new or worsening symptoms.

## 2020-09-14 DIAGNOSIS — M50323 Other cervical disc degeneration at C6-C7 level: Secondary | ICD-10-CM | POA: Diagnosis not present

## 2020-09-14 DIAGNOSIS — M9901 Segmental and somatic dysfunction of cervical region: Secondary | ICD-10-CM | POA: Diagnosis not present

## 2020-09-14 DIAGNOSIS — M47812 Spondylosis without myelopathy or radiculopathy, cervical region: Secondary | ICD-10-CM | POA: Diagnosis not present

## 2020-09-14 NOTE — Telephone Encounter (Signed)
Please schedule pt an appt per Dr Coy Saunas.  Thanks

## 2020-09-15 ENCOUNTER — Other Ambulatory Visit: Payer: Self-pay | Admitting: Internal Medicine

## 2020-09-15 DIAGNOSIS — M47812 Spondylosis without myelopathy or radiculopathy, cervical region: Secondary | ICD-10-CM | POA: Diagnosis not present

## 2020-09-15 DIAGNOSIS — M50323 Other cervical disc degeneration at C6-C7 level: Secondary | ICD-10-CM | POA: Diagnosis not present

## 2020-09-15 DIAGNOSIS — M9901 Segmental and somatic dysfunction of cervical region: Secondary | ICD-10-CM | POA: Diagnosis not present

## 2020-09-17 DIAGNOSIS — M47812 Spondylosis without myelopathy or radiculopathy, cervical region: Secondary | ICD-10-CM | POA: Diagnosis not present

## 2020-09-17 DIAGNOSIS — M50323 Other cervical disc degeneration at C6-C7 level: Secondary | ICD-10-CM | POA: Diagnosis not present

## 2020-09-17 DIAGNOSIS — M9901 Segmental and somatic dysfunction of cervical region: Secondary | ICD-10-CM | POA: Diagnosis not present

## 2020-09-21 DIAGNOSIS — M9901 Segmental and somatic dysfunction of cervical region: Secondary | ICD-10-CM | POA: Diagnosis not present

## 2020-09-21 DIAGNOSIS — M47812 Spondylosis without myelopathy or radiculopathy, cervical region: Secondary | ICD-10-CM | POA: Diagnosis not present

## 2020-09-21 DIAGNOSIS — M50323 Other cervical disc degeneration at C6-C7 level: Secondary | ICD-10-CM | POA: Diagnosis not present

## 2020-09-22 ENCOUNTER — Other Ambulatory Visit: Payer: Self-pay

## 2020-09-22 ENCOUNTER — Ambulatory Visit
Admission: RE | Admit: 2020-09-22 | Discharge: 2020-09-22 | Disposition: A | Discharge status: Home / Self Care | Payer: Medicare Other | Source: Ambulatory Visit | Attending: Family Medicine | Admitting: Family Medicine

## 2020-09-22 DIAGNOSIS — G4459 Other complicated headache syndrome: Secondary | ICD-10-CM

## 2020-09-22 DIAGNOSIS — R519 Headache, unspecified: Secondary | ICD-10-CM | POA: Diagnosis not present

## 2020-09-22 DIAGNOSIS — M50323 Other cervical disc degeneration at C6-C7 level: Secondary | ICD-10-CM | POA: Diagnosis not present

## 2020-09-22 DIAGNOSIS — R42 Dizziness and giddiness: Secondary | ICD-10-CM

## 2020-09-22 DIAGNOSIS — M47812 Spondylosis without myelopathy or radiculopathy, cervical region: Secondary | ICD-10-CM | POA: Diagnosis not present

## 2020-09-22 DIAGNOSIS — M9901 Segmental and somatic dysfunction of cervical region: Secondary | ICD-10-CM | POA: Diagnosis not present

## 2020-09-23 ENCOUNTER — Telehealth: Payer: Self-pay

## 2020-09-23 ENCOUNTER — Other Ambulatory Visit: Payer: Medicare Other

## 2020-09-23 DIAGNOSIS — M47812 Spondylosis without myelopathy or radiculopathy, cervical region: Secondary | ICD-10-CM

## 2020-09-23 NOTE — Telephone Encounter (Signed)
-----   Message from Carolyne Littles sent at 09/23/2020  8:33 AM EDT ----- Regarding: phone message Pt had MRI yesterday, she is asking for  only Dr. Nori Riis to call her with the results.

## 2020-09-24 DIAGNOSIS — M50323 Other cervical disc degeneration at C6-C7 level: Secondary | ICD-10-CM | POA: Diagnosis not present

## 2020-09-24 DIAGNOSIS — M9901 Segmental and somatic dysfunction of cervical region: Secondary | ICD-10-CM | POA: Diagnosis not present

## 2020-09-24 DIAGNOSIS — M47812 Spondylosis without myelopathy or radiculopathy, cervical region: Secondary | ICD-10-CM | POA: Diagnosis not present

## 2020-09-25 DIAGNOSIS — M47812 Spondylosis without myelopathy or radiculopathy, cervical region: Secondary | ICD-10-CM | POA: Insufficient documentation

## 2020-09-25 MED ORDER — PREDNISONE 20 MG PO TABS: ORAL_TABLET | ORAL | 0 refills | Status: AC

## 2020-09-25 NOTE — Assessment & Plan Note (Signed)
Will try oral steroids 2 weeks

## 2020-09-25 NOTE — Telephone Encounter (Signed)
[phone]     I spoke with her about her MRI.  The brain is normal.  She has significant joint effusion on the right side with some arthritis of C1-C2.  Per radiology report this could certainly explain her symptoms.  I told her I have never seen this before in this area.  It is a little unclear what the best treatment option would be.  We will start conservatively with steroid taper over 2 weeks.  She will either see me back in clinic or let me know by phone at the end of 2 weeks how she is.  The neck step after that I think would be evaluation by surgeon.  She will call in the interim with any new or worsening symptoms.  She told me her main concern was that this was some type of cancer and I reassured her that there is was not cancer but arthritis, very significant arthritis.

## 2020-09-28 DIAGNOSIS — M47812 Spondylosis without myelopathy or radiculopathy, cervical region: Secondary | ICD-10-CM | POA: Diagnosis not present

## 2020-09-28 DIAGNOSIS — M50323 Other cervical disc degeneration at C6-C7 level: Secondary | ICD-10-CM | POA: Diagnosis not present

## 2020-09-28 DIAGNOSIS — M9901 Segmental and somatic dysfunction of cervical region: Secondary | ICD-10-CM | POA: Diagnosis not present

## 2020-09-29 DIAGNOSIS — M9901 Segmental and somatic dysfunction of cervical region: Secondary | ICD-10-CM | POA: Diagnosis not present

## 2020-09-29 DIAGNOSIS — M50323 Other cervical disc degeneration at C6-C7 level: Secondary | ICD-10-CM | POA: Diagnosis not present

## 2020-09-29 DIAGNOSIS — M47812 Spondylosis without myelopathy or radiculopathy, cervical region: Secondary | ICD-10-CM | POA: Diagnosis not present

## 2020-10-01 DIAGNOSIS — M50323 Other cervical disc degeneration at C6-C7 level: Secondary | ICD-10-CM | POA: Diagnosis not present

## 2020-10-01 DIAGNOSIS — M9901 Segmental and somatic dysfunction of cervical region: Secondary | ICD-10-CM | POA: Diagnosis not present

## 2020-10-01 DIAGNOSIS — M47812 Spondylosis without myelopathy or radiculopathy, cervical region: Secondary | ICD-10-CM | POA: Diagnosis not present

## 2020-10-05 DIAGNOSIS — M9901 Segmental and somatic dysfunction of cervical region: Secondary | ICD-10-CM | POA: Diagnosis not present

## 2020-10-05 DIAGNOSIS — M50323 Other cervical disc degeneration at C6-C7 level: Secondary | ICD-10-CM | POA: Diagnosis not present

## 2020-10-05 DIAGNOSIS — M47812 Spondylosis without myelopathy or radiculopathy, cervical region: Secondary | ICD-10-CM | POA: Diagnosis not present

## 2020-10-07 ENCOUNTER — Other Ambulatory Visit: Payer: Self-pay | Admitting: Student

## 2020-10-07 DIAGNOSIS — G4700 Insomnia, unspecified: Secondary | ICD-10-CM

## 2020-10-07 DIAGNOSIS — F341 Dysthymic disorder: Secondary | ICD-10-CM

## 2020-10-07 NOTE — Telephone Encounter (Signed)
Next appt scheduled 10/19/20 with PCP. 

## 2020-10-08 DIAGNOSIS — M50323 Other cervical disc degeneration at C6-C7 level: Secondary | ICD-10-CM | POA: Diagnosis not present

## 2020-10-08 DIAGNOSIS — M9901 Segmental and somatic dysfunction of cervical region: Secondary | ICD-10-CM | POA: Diagnosis not present

## 2020-10-08 DIAGNOSIS — M47812 Spondylosis without myelopathy or radiculopathy, cervical region: Secondary | ICD-10-CM | POA: Diagnosis not present

## 2020-10-12 DIAGNOSIS — M9901 Segmental and somatic dysfunction of cervical region: Secondary | ICD-10-CM | POA: Diagnosis not present

## 2020-10-12 DIAGNOSIS — M47812 Spondylosis without myelopathy or radiculopathy, cervical region: Secondary | ICD-10-CM | POA: Diagnosis not present

## 2020-10-12 DIAGNOSIS — M50323 Other cervical disc degeneration at C6-C7 level: Secondary | ICD-10-CM | POA: Diagnosis not present

## 2020-10-15 DIAGNOSIS — M47812 Spondylosis without myelopathy or radiculopathy, cervical region: Secondary | ICD-10-CM | POA: Diagnosis not present

## 2020-10-15 DIAGNOSIS — M9901 Segmental and somatic dysfunction of cervical region: Secondary | ICD-10-CM | POA: Diagnosis not present

## 2020-10-15 DIAGNOSIS — M50323 Other cervical disc degeneration at C6-C7 level: Secondary | ICD-10-CM | POA: Diagnosis not present

## 2020-10-17 ENCOUNTER — Encounter: Payer: Self-pay | Admitting: *Deleted

## 2020-10-17 NOTE — Progress Notes (Signed)
Things That May Be Affecting Your Health:  Alcohol  Hearing loss  Pain   x Depression x Home Safety  Sexual Health   Diabetes  Lack of physical activity  Stress   Difficulty with daily activities  Loneliness  Tiredness   Drug use x Medicines  Tobacco use   Falls  Motor Vehicle Safety  Weight   Food choices  Oral Health  Other    YOUR PERSONALIZED HEALTH PLAN : 1. Schedule your next subsequent Medicare Wellness visit in one year 2. Attend all of your regular appointments to address your medical issues 3. Complete the preventative screenings and services   Annual Wellness Visit   Medicare Covered Preventative Screenings and [place] Men and Women Who How Often Need? Date of Last Service Action  Abdominal Aortic Aneurysm Adults with AAA risk factors Once      Alcohol Misuse and Counseling All Adults Screening once a year if no alcohol misuse. Counseling up to 4 face to face sessions.     Bone Density Measurement  Adults at risk for osteoporosis Once every 2 yrs      Lipid Panel Z13.6 All adults without CV disease Once every 5 yrs       Colorectal Cancer   Stool sample or  Colonoscopy All adults 57 and older   Once every year  Every 10 years x       Depression All Adults Once a year  Today   Diabetes Screening Blood glucose, post glucose load, or GTT Z13.1  All adults at risk  Pre-diabetics  Once per year  Twice per year      Diabetes  Self-Management Training All adults Diabetics 10 hrs first year; 2 hours subsequent years. Requires Copay     Glaucoma  Diabetics  Family history of glaucoma  African Americans 59 yrs +  Hispanic Americans 38 yrs + Annually - requires coppay      Hepatitis C Z72.89 or F19.20  High Risk for HCV  Born between 1945 and 1965  Annually  Once      HIV Z11.4 All adults based on risk  Annually btw ages 62 & 43 regardless of risk  Annually > 65 yrs if at increased risk      Lung Cancer Screening  Asymptomatic adults aged 76-77 with 30 pack yr history and current smoker OR quit within the last 15 yrs Annually Must have counseling and shared decision making documentation before first screen      Medical Nutrition Therapy Adults with   Diabetes  Renal disease  Kidney transplant within past 3 yrs 3 hours first year; 2 hours subsequent years     Obesity and Counseling All adults Screening once a year Counseling if BMI 30 or higher  Today   Tobacco Use Counseling Adults who use tobacco  Up to 8 visits in one year     Vaccines Z23  Hepatitis B  Influenza   Pneumonia  Adults   Once  Once every flu season  Two different vaccines separated by one year x    Next Annual Wellness Visit People with Medicare Every year  Today     Services & Screenings Women Who How Often Need  Date of Last Service Action  Mammogram  Z12.31 Women over 69 One baseline ages 63-39. Annually ager 40 yrs+      Pap tests All women Annually if high risk. Every 2 yrs for normal risk women  Screening for cervical cancer with   Pap (Z01.419 nl or Z01.411abnl) &  HPV Z11.51 Women aged 14 to 93 Once every 5 yrs     Screening pelvic and breast exams All women Annually if high risk. Every 2 yrs for normal risk women     Sexually Transmitted Diseases  Chlamydia  Gonorrhea  Syphilis All at risk adults Annually for non pregnant females at increased risk         [place] Men Who How Ofter Need  Date of Last Service Action  Prostate Cancer - DRE & PSA Men over 50 Annually.  DRE might require a copay.        Sexually Transmitted Diseases  Syphilis All at risk adults Annually for men at increased risk      Health Maintenance List Health Maintenance  Topic Date Due  . COLONOSCOPY (Pts 45-56yrs Insurance coverage will need to be confirmed)  Never done  . TETANUS/TDAP  06/07/2020  . INFLUENZA VACCINE  01/04/2021  . COLON CANCER SCREENING ANNUAL FOBT  02/03/2021  . MAMMOGRAM   06/02/2021  . DEXA SCAN  Completed  . COVID-19 Vaccine  Completed  . Hepatitis C Screening  Completed  . PNA vac Low Risk Adult  Completed  . HPV VACCINES  Aged Out

## 2020-10-17 NOTE — Progress Notes (Signed)

## 2020-10-19 ENCOUNTER — Encounter: Payer: Self-pay | Admitting: Internal Medicine

## 2020-10-19 ENCOUNTER — Ambulatory Visit (INDEPENDENT_AMBULATORY_CARE_PROVIDER_SITE_OTHER): Payer: Medicare Other | Admitting: Internal Medicine

## 2020-10-19 ENCOUNTER — Other Ambulatory Visit: Payer: Self-pay

## 2020-10-19 VITALS — BP 126/62 | HR 76 | Temp 98.7°F | Ht 63.0 in | Wt 215.1 lb

## 2020-10-19 DIAGNOSIS — Z6838 Body mass index (BMI) 38.0-38.9, adult: Secondary | ICD-10-CM | POA: Diagnosis not present

## 2020-10-19 DIAGNOSIS — T8484XA Pain due to internal orthopedic prosthetic devices, implants and grafts, initial encounter: Secondary | ICD-10-CM | POA: Diagnosis not present

## 2020-10-19 DIAGNOSIS — R7303 Prediabetes: Secondary | ICD-10-CM

## 2020-10-19 DIAGNOSIS — R918 Other nonspecific abnormal finding of lung field: Secondary | ICD-10-CM | POA: Diagnosis not present

## 2020-10-19 DIAGNOSIS — I10 Essential (primary) hypertension: Secondary | ICD-10-CM

## 2020-10-19 DIAGNOSIS — M9901 Segmental and somatic dysfunction of cervical region: Secondary | ICD-10-CM | POA: Diagnosis not present

## 2020-10-19 DIAGNOSIS — E669 Obesity, unspecified: Secondary | ICD-10-CM | POA: Diagnosis not present

## 2020-10-19 DIAGNOSIS — G4700 Insomnia, unspecified: Secondary | ICD-10-CM | POA: Diagnosis not present

## 2020-10-19 DIAGNOSIS — M47812 Spondylosis without myelopathy or radiculopathy, cervical region: Secondary | ICD-10-CM | POA: Diagnosis not present

## 2020-10-19 DIAGNOSIS — M50323 Other cervical disc degeneration at C6-C7 level: Secondary | ICD-10-CM | POA: Diagnosis not present

## 2020-10-19 LAB — GLUCOSE, CAPILLARY: Glucose-Capillary: 111 mg/dL — ABNORMAL HIGH (ref 70–99)

## 2020-10-19 LAB — POCT GLYCOSYLATED HEMOGLOBIN (HGB A1C): Hemoglobin A1C: 6.2 % — AB (ref 4.0–5.6)

## 2020-10-19 NOTE — Assessment & Plan Note (Signed)
Patient is requesting a check for diabetes, she states that her grandmother and grandfather had diabetes.  Her last A1c in 2017 was 5.2.  Patient is obese with a BMI of 38.  We will screen for diabetes today. -Check A1c

## 2020-10-19 NOTE — Patient Instructions (Addendum)
Ms. ANNISSA ANDREONI,  It was a pleasure to see you today. Thank you for coming in.   Today we discussed your blood pressure. In regards to this please continue taking your medications. Your blood pressure looks good today.   We discussed Parkinsons, you do not have any signs of Parkinsons at this time.   I have ordered some chest imaging to monitor the lung nodule that you have.   Please return to clinic in 6 months or sooner if needed.   Thank you again for coming in.   [name]

## 2020-10-19 NOTE — Assessment & Plan Note (Signed)
Patient is currently on Xanax 1 mg nightly for insomnia, chronic and stable.  Prior taper was trialed however unsuccessful.  This is a long-term medication.  We will continue her on Xanax 1 mg nightly for now.

## 2020-10-19 NOTE — Assessment & Plan Note (Signed)
Patient has a history of pulmonary nodule, CT chest in 2020 showed stable 7 mm pulmonary nodule, recommended repeat in 2 years.  She denies any shortness of breath, cough, wheezing, hemoptysis, or other symptoms.  Patient is due for a repeat CT chest. -CT chest without contrast
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
## 2020-10-19 NOTE — Assessment & Plan Note (Signed)
Patient is currently on Lasix 20 mg daily and lisinopril 20 mg daily.  She denies any issues taking her medications.  No side effects.  Blood pressure today is well controlled at 126/62.  No changes.

## 2020-10-20 NOTE — Progress Notes (Signed)
Internal Medicine Clinic Attending ° °Case discussed with Dr. Krienke  At the time of the visit.  We reviewed the resident’s history and exam and pertinent patient test results.  I agree with the assessment, diagnosis, and plan of care documented in the resident’s note.  °

## 2020-10-22 DIAGNOSIS — M50323 Other cervical disc degeneration at C6-C7 level: Secondary | ICD-10-CM | POA: Diagnosis not present

## 2020-10-22 DIAGNOSIS — M9901 Segmental and somatic dysfunction of cervical region: Secondary | ICD-10-CM | POA: Diagnosis not present

## 2020-10-22 DIAGNOSIS — M47812 Spondylosis without myelopathy or radiculopathy, cervical region: Secondary | ICD-10-CM | POA: Diagnosis not present

## 2020-10-23 MED ORDER — METFORMIN HCL ER 500 MG PO TB24: 500.0000 mg | ORAL_TABLET | Freq: Every day | ORAL | 5 refills | Status: DC

## 2020-10-23 NOTE — Addendum Note (Signed)
Addended by: Asencion Noble on: 10/23/2020 11:54 AM   Modules accepted: Orders

## 2020-10-23 NOTE — Assessment & Plan Note (Signed)
We checked an A1c at last visit, came back at 6.2.  Contacted patient with results and informed of prediabetes status.  Discussed lifestyle modifications.  Discussed starting metformin to decrease risk of progression to diabetes and she was interested in this.  -Start metformin 500 mg daily -Repeat A1c in 3 months

## 2020-10-26 DIAGNOSIS — S61211S Laceration without foreign body of left index finger without damage to nail, sequela: Secondary | ICD-10-CM | POA: Diagnosis not present

## 2020-10-31 ENCOUNTER — Encounter: Payer: Self-pay | Admitting: *Deleted

## 2020-11-02 ENCOUNTER — Other Ambulatory Visit: Payer: Self-pay | Admitting: Family Medicine

## 2020-11-06 ENCOUNTER — Ambulatory Visit (INDEPENDENT_AMBULATORY_CARE_PROVIDER_SITE_OTHER): Payer: Medicare Other | Admitting: Family Medicine

## 2020-11-06 ENCOUNTER — Other Ambulatory Visit: Payer: Self-pay

## 2020-11-06 VITALS — BP 134/68 | Ht 63.0 in | Wt 214.0 lb

## 2020-11-06 DIAGNOSIS — M542 Cervicalgia: Secondary | ICD-10-CM | POA: Diagnosis not present

## 2020-11-06 DIAGNOSIS — M25559 Pain in unspecified hip: Secondary | ICD-10-CM | POA: Diagnosis not present

## 2020-11-06 DIAGNOSIS — M47812 Spondylosis without myelopathy or radiculopathy, cervical region: Secondary | ICD-10-CM

## 2020-11-06 MED ORDER — TRAMADOL HCL 50 MG PO TABS: 50.0000 mg | ORAL_TABLET | Freq: Two times a day (BID) | ORAL | 5 refills | Status: DC | PRN

## 2020-11-07 NOTE — Assessment & Plan Note (Signed)
Some improvement with steroid burst which I believe confirms that OA at this facet joint is the major issue. Also likely accounts for the "pop" she hears at times. She is reassured brain is without lesion. Long term prednisone or NSAID not best approach in my opinion, nor is surgical intervention first line. Will try using bid/prn low dose tramadol and see if this alleviates symptoms adequately. Can f/u 1 month if not having pain managed or 3-4 m prn if tramadol is working

## 2020-11-07 NOTE — Progress Notes (Signed)
  Lori Bradford - 74 y.o. female MRN [id number]  Date of birth: 29-Aug-1945    SUBJECTIVE:      Chief Complaint:/ HPI:  continued neck pain that is improved with use of steroid burst/taper She did not take it exactly as rx but her pain was much better with higher doses and started to return as she tapered to lower doses She is immensely grateful for the normal findings of brain, as she was quite worried she had brain tumor. Relieved her pain and other symptoms  was found to be related to facet joint arthritis C spine  2. Had a fall landing on her left hip. She cut her finger and her  Ex husband came and drove her to fire station where they were able to help her with the bleeding. Unfortunately she tripped stepping into the fire station, not realizing there was a step up/over at the threshold. Landed on left  hip. Did not hear a pop. As the fireman and her ex spouse tried to help her up she felt her hip get "stretched". For a day or so after she had deep seated lateral hip pain that has now became more superficial but is still present. Able to walk with some exacerbation of pain but not severe. N pain with weightbearing.   PERTINENT  PMH / PSH: I have reviewed the patient's medications, allergies, past medical and surgical history, smoking status.  Pertinent findings that relate to today's visit / issues include: B TKR Right hip arthroplasty   OBJECTIVE: BP 134/68   Ht 5\' 3"  (1.6 m)   Wt 214 lb (97.1 kg)   BMI 37.91 kg/m   Physical Exam:  Vital signs are reviewed. GEN WD WN NAD Neck FROM. No pain with normal ROM. Neck muscles slightly tight. HIPS: B FROm that is painless IR/ER. Left hip mild ttp of very superficial part of tissue that is actually well above the hip and more on the lower abdomen /pelvic crest. Tenderness superficial soft tissue, not bony and she tolerates deep palpation ol. Left hip flexion painless and has full 5/5 strength. No pain with standing.  Gait is slightly antalgic  but able to get on and off table and ambulate without assistance. SKIN around left hip no bruising or erythema. No skin lesions.  ASSESSMENT & PLAN: 1. Left hip pain after fall: she has  Mostly soft tissue pain. Will get imaging and will include rigt hip and pelvis given hx of rightt hip arthroplasty I will contact her after I review films. I have low suspicion for fracture.   See problem based charting & AVS for pt instructions. 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 Also likely accounts for the "pop" she hears at times. She is reassured brain is without lesion. Long term prednisone or NSAID not best approach in my opinion, nor is surgical intervention first line. Will try using bid/prn low dose tramadol and see if this alleviates symptoms adequately. Can f/u 1 month if not having pain managed or 3-4 m prn if tramadol is working

## 2020-11-09 ENCOUNTER — Ambulatory Visit (HOSPITAL_COMMUNITY)
Admission: RE | Admit: 2020-11-09 | Discharge: 2020-11-09 | Disposition: A | Discharge status: Home / Self Care | Payer: Medicare Other | Source: Ambulatory Visit | Attending: Internal Medicine | Admitting: Internal Medicine

## 2020-11-09 ENCOUNTER — Other Ambulatory Visit: Payer: Self-pay

## 2020-11-09 DIAGNOSIS — J8410 Pulmonary fibrosis, unspecified: Secondary | ICD-10-CM | POA: Diagnosis not present

## 2020-11-09 DIAGNOSIS — M47819 Spondylosis without myelopathy or radiculopathy, site unspecified: Secondary | ICD-10-CM | POA: Diagnosis not present

## 2020-11-09 DIAGNOSIS — R918 Other nonspecific abnormal finding of lung field: Secondary | ICD-10-CM

## 2020-11-09 DIAGNOSIS — M4856XA Collapsed vertebra, not elsewhere classified, lumbar region, initial encounter for fracture: Secondary | ICD-10-CM | POA: Diagnosis not present

## 2020-11-09 DIAGNOSIS — I700 Atherosclerosis of aorta: Secondary | ICD-10-CM | POA: Diagnosis not present

## 2020-11-11 ENCOUNTER — Other Ambulatory Visit: Payer: Self-pay | Admitting: Internal Medicine

## 2020-11-11 DIAGNOSIS — G4700 Insomnia, unspecified: Secondary | ICD-10-CM

## 2020-11-11 DIAGNOSIS — F341 Dysthymic disorder: Secondary | ICD-10-CM

## 2020-12-05 ENCOUNTER — Other Ambulatory Visit (INDEPENDENT_AMBULATORY_CARE_PROVIDER_SITE_OTHER): Payer: Self-pay | Admitting: Orthopaedic Surgery

## 2020-12-05 ENCOUNTER — Other Ambulatory Visit: Payer: Self-pay | Admitting: Internal Medicine

## 2020-12-08 ENCOUNTER — Other Ambulatory Visit: Payer: Self-pay | Admitting: Student

## 2020-12-08 DIAGNOSIS — F341 Dysthymic disorder: Secondary | ICD-10-CM

## 2020-12-08 DIAGNOSIS — G4700 Insomnia, unspecified: Secondary | ICD-10-CM

## 2020-12-08 NOTE — Telephone Encounter (Signed)
ok 

## 2020-12-23 ENCOUNTER — Telehealth: Payer: Self-pay

## 2020-12-23 NOTE — Telephone Encounter (Signed)
[facility] rep is requesting a call back about pt .Marland [phone] Revonda Humphrey

## 2020-12-24 NOTE — Telephone Encounter (Signed)
Abby from [facility] returned call. States per Guidelines, patient should be on a statin 2/2 diabetes. Will forward to Red Team for consideration.

## 2020-12-24 NOTE — Telephone Encounter (Signed)
Number provided is for [facility]. No answer. Left message on VM requesting return call.

## 2020-12-25 NOTE — Telephone Encounter (Signed)
Attempted to contact patient this morning.  No answer left detailed message asking to please give clinic a call back.  Appointment scheduled for 01/21/2021 at 10:15 am and mailed to patient.

## 2021-01-08 ENCOUNTER — Ambulatory Visit (INDEPENDENT_AMBULATORY_CARE_PROVIDER_SITE_OTHER): Payer: Medicare Other | Admitting: Family Medicine

## 2021-01-08 ENCOUNTER — Other Ambulatory Visit: Payer: Self-pay

## 2021-01-08 ENCOUNTER — Encounter: Payer: Self-pay | Admitting: Family Medicine

## 2021-01-08 DIAGNOSIS — M19041 Primary osteoarthritis, right hand: Secondary | ICD-10-CM

## 2021-01-08 DIAGNOSIS — G4700 Insomnia, unspecified: Secondary | ICD-10-CM | POA: Diagnosis not present

## 2021-01-08 DIAGNOSIS — M19042 Primary osteoarthritis, left hand: Secondary | ICD-10-CM

## 2021-01-08 DIAGNOSIS — M47812 Spondylosis without myelopathy or radiculopathy, cervical region: Secondary | ICD-10-CM

## 2021-01-08 DIAGNOSIS — F341 Dysthymic disorder: Secondary | ICD-10-CM

## 2021-01-08 DIAGNOSIS — I10 Essential (primary) hypertension: Secondary | ICD-10-CM | POA: Diagnosis not present

## 2021-01-08 MED ORDER — FUROSEMIDE 20 MG PO TABS: 20.0000 mg | ORAL_TABLET | Freq: Every day | ORAL | 3 refills | Status: DC

## 2021-01-08 MED ORDER — LISINOPRIL 20 MG PO TABS: 20.0000 mg | ORAL_TABLET | Freq: Every day | ORAL | 3 refills | Status: DC

## 2021-01-08 MED ORDER — ALPRAZOLAM 1 MG PO TABS
ORAL_TABLET | ORAL | 5 refills | Status: DC
Start: 2021-01-08 — End: 2021-07-09

## 2021-01-08 MED ORDER — OMEPRAZOLE 20 MG PO CPDR: 20.0000 mg | DELAYED_RELEASE_CAPSULE | Freq: Every day | ORAL | 3 refills | Status: DC

## 2021-01-08 NOTE — Patient Instructions (Addendum)
Referral to Dr. Arnoldo Morale at [address], [place]  They will contact you to schedule an appt. Let us know if you haven't heard from them in a week.  Follow up with Dr. Nori Riis at the [facility]

## 2021-01-08 NOTE — Progress Notes (Signed)
  Lori Bradford - 75 y.o. female MRN [id number]  Date of birth: [birth year]    SUBJECTIVE:      Chief Complaint:/ HPI:   Right sided neck/jaw/ear pain. Similar to previous episodes. Worsening over last few weeks. Frustrated there is nothing that seems to help it (as much as the steroid burst). Does not want chronic pain medicine. Wants some other type of therapy or intervention. Just wants it to go away.  Chronic medical issues. Asks again about PCP referral as she is unhappy still with her current. Says she is not going back there, but is about to run out of some of her meds. Wants suggestions.    OBJECTIVE: BP (!) 149/76   Ht '5\' 3"'$  (1.6 m)   Wt 212 lb (96.2 kg)   BMI 37.55 kg/m   Physical Exam:  Vital signs are reviewed. GEN WD WN NAD NECK nontender. Pain with compression axially.  Jaw: TMJ is nontender, mild click but that is bilateral and painless.    IMAGING: MRI head 09/2020 :C1-2 active right facet arthritis which could certainly explain the provided history.   ASSESSMENT & PLAN:  See problem based charting & AVS for pt instructions. 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 This continues to bother her. Will refer to [facility] for evaluation. She does not want chronic pain meds. Steroids helped but not optimal therapy  Re chronic medical issues. Have agreed to take her into my continuity practice. Refills of meds she was running out of and first appt at [facility] made with me. She is very grateful for this.

## 2021-01-08 NOTE — Assessment & Plan Note (Signed)
Unsure if there is intervention (injection/fusion etc) available. This continues to bother her. Will refer to [facility] for evaluation. She does not want chronic pain meds. Steroids helped but not optimal therapy

## 2021-01-21 ENCOUNTER — Encounter: Payer: Medicare Other | Admitting: Internal Medicine

## 2021-02-02 DIAGNOSIS — I10 Essential (primary) hypertension: Secondary | ICD-10-CM | POA: Diagnosis not present

## 2021-02-02 DIAGNOSIS — M47812 Spondylosis without myelopathy or radiculopathy, cervical region: Secondary | ICD-10-CM | POA: Diagnosis not present

## 2021-02-10 ENCOUNTER — Ambulatory Visit: Payer: Medicare Other | Admitting: Family Medicine

## 2021-02-17 ENCOUNTER — Other Ambulatory Visit (INDEPENDENT_AMBULATORY_CARE_PROVIDER_SITE_OTHER): Payer: Self-pay | Admitting: Orthopaedic Surgery

## 2021-02-17 NOTE — Telephone Encounter (Signed)
ok 

## 2021-03-09 ENCOUNTER — Other Ambulatory Visit: Payer: Self-pay | Admitting: Internal Medicine

## 2021-03-17 ENCOUNTER — Ambulatory Visit (INDEPENDENT_AMBULATORY_CARE_PROVIDER_SITE_OTHER): Payer: Medicare Other | Admitting: Family Medicine

## 2021-03-17 ENCOUNTER — Ambulatory Visit (INDEPENDENT_AMBULATORY_CARE_PROVIDER_SITE_OTHER): Payer: Medicare Other

## 2021-03-17 ENCOUNTER — Encounter: Payer: Self-pay | Admitting: Family Medicine

## 2021-03-17 ENCOUNTER — Other Ambulatory Visit: Payer: Self-pay

## 2021-03-17 VITALS — BP 139/46 | HR 81 | Ht 63.0 in | Wt 207.2 lb

## 2021-03-17 DIAGNOSIS — M47812 Spondylosis without myelopathy or radiculopathy, cervical region: Secondary | ICD-10-CM | POA: Diagnosis not present

## 2021-03-17 DIAGNOSIS — Z23 Encounter for immunization: Secondary | ICD-10-CM

## 2021-03-17 NOTE — Patient Instructions (Addendum)
Grea to see you! See you back in February or March unless something comes up sooner!

## 2021-03-18 NOTE — Assessment & Plan Note (Signed)
I reviewed his note with her.  She was relieved to realize there was nothing new.  Recommend continuing the Biofreeze as needed.  She will likely have intermittent flares of this and we discussed that at some length.

## 2021-03-18 NOTE — Progress Notes (Signed)
    CHIEF COMPLAINT / HPI: Establish care.  Biggest concerns whether or not the neurosurgeon sent me a note about her visit.  He did some x-rays and she is worried they found something and did not tell her.  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  She has a lot of questions about that.  She has been using some Biofreeze which is helpful.   PERTINENT  PMH / PSH: I have reviewed the patient's medications, allergies, past medical and surgical history, smoking status and updated in the EMR as appropriate.   OBJECTIVE:  BP (!) 139/46   Pulse 81   Ht 5\' 3"  (1.6 m)   Wt 207 lb 3.2 oz (94 kg)   SpO2 97%   BMI 36.70 kg/m   Vital signs reviewed. GENERAL: Well-developed, well-nourished, no acute distress. CARDIOVASCULAR: Regular rate and rhythm no murmur gallop or rub LUNGS: Clear to auscultation bilaterally, no rales or wheeze. ABDOMEN: Soft positive bowel sounds NEURO: No gross focal neurological deficits. MSK: Movement of extremity x 4.  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  She was relieved to realize there was nothing new.  Recommend continuing the Biofreeze as needed.  She will likely have intermittent flares of this and we discussed that at some length.   Dorcas Mcmurray MD

## 2021-04-11 ENCOUNTER — Other Ambulatory Visit (INDEPENDENT_AMBULATORY_CARE_PROVIDER_SITE_OTHER): Payer: Self-pay | Admitting: Orthopaedic Surgery

## 2021-05-04 DIAGNOSIS — I10 Essential (primary) hypertension: Secondary | ICD-10-CM | POA: Diagnosis not present

## 2021-05-04 DIAGNOSIS — M542 Cervicalgia: Secondary | ICD-10-CM | POA: Diagnosis not present

## 2021-05-17 ENCOUNTER — Other Ambulatory Visit (INDEPENDENT_AMBULATORY_CARE_PROVIDER_SITE_OTHER): Payer: Self-pay | Admitting: Orthopaedic Surgery

## 2021-05-24 ENCOUNTER — Encounter: Payer: Self-pay | Admitting: Physician Assistant

## 2021-05-24 ENCOUNTER — Ambulatory Visit (INDEPENDENT_AMBULATORY_CARE_PROVIDER_SITE_OTHER): Payer: Medicare Other | Admitting: Physician Assistant

## 2021-05-24 ENCOUNTER — Other Ambulatory Visit: Payer: Self-pay

## 2021-05-24 DIAGNOSIS — G8929 Other chronic pain: Secondary | ICD-10-CM | POA: Diagnosis not present

## 2021-05-24 DIAGNOSIS — R519 Headache, unspecified: Secondary | ICD-10-CM

## 2021-05-24 DIAGNOSIS — R200 Anesthesia of skin: Secondary | ICD-10-CM

## 2021-05-24 NOTE — Progress Notes (Signed)
HPI: [name] comes in today with right hand pain and numbness.  She states that she has had increasing numbness and pain in her right hand to the point that she is actually burned her right long finger.  States that hand is killing her.  History of left carpal tunnel release 514 2020 minutes doing well.  She had no new injury to the right hand.  She also notes that she has had some neck pain and popping her neck saw Dr. Arnoldo Morale reports that he felt that her radiographs were normal I do not have access to these films.  MRI of her brain without contrast done on 09/22/2020 did show C1 to facet arthritis with joint effusion.  No bony lesions.  Otherwise the MRI was negative.  She reports that she is having sensation of her head being bruised and points to the occipital region on the right.  Notes some change in her vision.  She is is not really having headaches at this point in time.  Review of systems: See HPI otherwise negative  Physical exam: General well-developed well-nourished female no acute distress mood and affect appropriate. Psych: Alert and oriented x3 Bilateral hands: No rashes skin lesions ulcerations.  Subjective decrease sensation median nerve distribution right hand.  Full sensation throughout left hand.  Full motor bilateral hands.  Negative Tinel's over the right median nerve at the wrist positive compression test on the right over the median nerve at the wrist.  Phalen's test is negative bilaterally.  Surgical incision left hand well-healed.  Impression: Right hand paresthesia  Plan: Recommend EMG nerve conduction studies to rule out carpal tunnel as the source of her right hand numbness tingling.  In regards to the occipital pain and head we will refer to neurology.  Questions were encouraged and answered at length.  We will see her back about a week after the nerve conduction studies to go over the results and discuss further treatment.

## 2021-05-26 ENCOUNTER — Encounter: Payer: Self-pay | Admitting: Neurology

## 2021-06-04 ENCOUNTER — Other Ambulatory Visit (INDEPENDENT_AMBULATORY_CARE_PROVIDER_SITE_OTHER): Payer: Self-pay | Admitting: Orthopaedic Surgery

## 2021-06-08 ENCOUNTER — Other Ambulatory Visit: Payer: Self-pay | Admitting: Family Medicine

## 2021-06-08 ENCOUNTER — Other Ambulatory Visit: Payer: Self-pay | Admitting: Internal Medicine

## 2021-06-08 DIAGNOSIS — M542 Cervicalgia: Secondary | ICD-10-CM

## 2021-06-09 ENCOUNTER — Other Ambulatory Visit: Payer: Self-pay

## 2021-06-09 ENCOUNTER — Ambulatory Visit (INDEPENDENT_AMBULATORY_CARE_PROVIDER_SITE_OTHER): Payer: Medicare Other | Admitting: Physical Medicine and Rehabilitation

## 2021-06-09 DIAGNOSIS — R202 Paresthesia of skin: Secondary | ICD-10-CM | POA: Diagnosis not present

## 2021-06-09 NOTE — Progress Notes (Signed)
Lori Bradford - 76 y.o. female MRN [id number]  Date of birth: [birth year]  Office Visit Note: Visit Date: 06/09/2021 PCP: Dickie La, MD Referred by: Dickie La, MD  Subjective: No chief complaint on file.  HPI:  Lori Bradford is a 76 y.o. female who comes in today at the request of Benita Stabile, PA-C for electrodiagnostic study of the Right upper extremities.  Patient is Right hand dominant.  She reports chronic worsening severe right hand numbness and tingling particular in the radial digits.  She does feel like she is getting weaker and is hard to do small objects with her hand.  She reports this is a very similar sensation and feeling that she had on the left prior to her left carpal tunnel release a few years ago.  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  She is done well post surgery.  She also reports neck pain but not frank radicular symptoms.  She has some referral pattern up into the side of the head and occiput area.  The notes from Benita Stabile, P.A.-C were reviewed and she has had some work-up on this.  I do feel like she might benefit from focused physical therapy with dry needling and trigger point treatment.  ROS Otherwise per HPI.  Assessment & Plan: Visit Diagnoses:    ICD-10-CM   1. Paresthesia of skin  R20.2 NCV with EMG (electromyography)      Plan:Impression: The above electrodiagnostic study is ABNORMAL and reveals evidence of a severe right median nerve entrapment at the wrist (carpal tunnel syndrome) affecting sensory and motor components.   There is no significant electrodiagnostic evidence of any other focal nerve entrapment, brachial plexopathy or cervical radiculopathy.   Recommendations: 1.  Follow-up with referring physician. 2.  Continue current management of symptoms. 3.  Continue use of resting splint at night-time and as  needed during the day. 4.  Suggest surgical evaluation.  Meds & Orders: No orders of the defined types were placed in this encounter.   Orders Placed This Encounter  Procedures   NCV with EMG (electromyography)    Follow-up: Return in about 2 weeks (around 06/23/2021) for Benita Stabile, PA-C.   Procedures: No procedures performed  EMG & NCV Findings: Evaluation of the right median motor nerve showed prolonged distal onset latency (7.1 ms), reduced amplitude (3.8 mV), and decreased conduction velocity (Elbow-Wrist, 37 m/s).  The right median (across palm) sensory nerve showed prolonged distal peak latency (Wrist, 7.1 ms), reduced amplitude (7.4 V), and prolonged distal peak latency (Palm, 4.2 ms).  All remaining nerves (as indicated in the following tables) were within normal limits.    All examined muscles (as indicated in the following table) showed no evidence of electrical instability.    Impression: The above electrodiagnostic study is ABNORMAL and reveals evidence of a severe right median nerve entrapment at the wrist (carpal tunnel syndrome) affecting sensory and motor components.   There is no significant electrodiagnostic evidence of any other focal nerve entrapment, brachial plexopathy or cervical radiculopathy.   Recommendations: 1.  Follow-up with referring physician. 2.  Continue current management of symptoms. 3.  Continue use of resting splint at night-time and as needed during the day. 4.  Suggest surgical evaluation.  ___________________________ Wonda Olds Board Certified, American Board of Physical Medicine and Rehabilitation    Nerve Conduction Studies Anti  Sensory Summary Table   Stim Site NR Peak (ms) Norm Peak (ms) P-T Amp (V) Norm P-T Amp Site1 Site2 Delta-P (ms) Dist (cm) Vel (m/s) Norm Vel (m/s)  Right Median Acr Palm Anti Sensory (2nd Digit)  32C  Wrist    *7.1 <3.6 *7.4 >10 Wrist Palm 2.9 0.0    Palm    *4.2 <2.0 1.7         Right Radial Anti  Sensory (Base 1st Digit)  32.3C  Wrist    1.9 <3.1 24.5  Wrist Base 1st Digit 1.9 0.0    Right Ulnar Anti Sensory (5th Digit)  32.4C  Wrist    2.9 <3.7 20.7 >15.0 Wrist 5th Digit 2.9 14.0 48 >38   Motor Summary Table   Stim Site NR Onset (ms) Norm Onset (ms) O-P Amp (mV) Norm O-P Amp Site1 Site2 Delta-0 (ms) Dist (cm) Vel (m/s) Norm Vel (m/s)  Right Median Motor (Abd Poll Brev)  32.5C    Martin-Gruber  Wrist    *7.1 <4.2 *3.8 >5 Elbow Wrist 4.8 18.0 *37 >50  Elbow    11.9  3.7         Right Ulnar Motor (Abd Dig Min)  32.8C  Wrist    2.7 <4.2 8.2 >3 B Elbow Wrist 2.5 17.0 68 >53  B Elbow    5.2  8.3  A Elbow B Elbow 1.1 10.0 91 >53  A Elbow    6.3  7.7          EMG   Side Muscle Nerve Root Ins Act Fibs Psw Amp Dur Poly Recrt Int Fraser Din Comment  Right Abd Poll Brev Median C8-T1 Nml Nml Nml Nml Nml 0 Nml Nml   Right 1stDorInt Ulnar C8-T1 Nml Nml Nml Nml Nml 0 Nml Nml   Right PronatorTeres Median C6-7 Nml Nml Nml Nml Nml 0 Nml Nml   Right Biceps Musculocut C5-6 Nml Nml Nml Nml Nml 0 Nml Nml   Right Deltoid Axillary C5-6 Nml Nml Nml Nml Nml 0 Nml Nml     Nerve Conduction Studies Anti Sensory Left/Right Comparison   Stim Site L Lat (ms) R Lat (ms) L-R Lat (ms) L Amp (V) R Amp (V) L-R Amp (%) Site1 Site2 L Vel (m/s) R Vel (m/s) L-R Vel (m/s)  Median Acr Palm Anti Sensory (2nd Digit)  32C  Wrist  *7.1   *7.4  Wrist Palm     Palm  *4.2   1.7        Radial Anti Sensory (Base 1st Digit)  32.3C  Wrist  1.9   24.5  Wrist Base 1st Digit     Ulnar Anti Sensory (5th Digit)  32.4C  Wrist  2.9   20.7  Wrist 5th Digit  48    Motor Left/Right Comparison   Stim Site L Lat (ms) R Lat (ms) L-R Lat (ms) L Amp (mV) R Amp (mV) L-R Amp (%) Site1 Site2 L Vel (m/s) R Vel (m/s) L-R Vel (m/s)  Median Motor (Abd Poll Brev)  32.5C    Martin-Gruber  Wrist  *7.1   *3.8  Elbow Wrist  *37   Elbow  11.9   3.7        Ulnar Motor (Abd Dig Min)  32.8C  Wrist  2.7   8.2  B Elbow Wrist  68   B Elbow  5.2    8.3  A Elbow B Elbow  91   A Elbow  6.3   7.7  Waveforms:             Clinical History: 08/15/2018 Impression: The above electrodiagnostic study is ABNORMAL and reveals evidence of a severe left median nerve entrapment at the wrist (carpal tunnel syndrome) affecting sensory and motor components.    There is no significant electrodiagnostic evidence of any other focal nerve entrapment, brachial plexopathy or cervical radiculopathy   Recommendations: 1.  Follow-up with referring physician. 2.  Continue current management of symptoms. 3.  Continue use of resting splint at night-time and as needed during the day. 4.  Suggest surgical evaluation.   ___________________________ Laurence Spates FAAPMR     Objective:  VS:  HT:     WT:    BMI:      BP:    HR: bpm   TEMP: ( )   RESP:  Physical Exam Musculoskeletal:        General: No swelling, tenderness or deformity.     Comments: Inspection reveals flattening of the right APB but no atrophy of the bilateral APB or FDI or hand intrinsics. There is no swelling, color changes, allodynia or dystrophic changes. There is 5 out of 5 strength in the bilateral wrist extension, finger abduction and long finger flexion.  There is decreased sensation in the right median nerve distribution.   There is a negative Phalen's test bilaterally. There is a negative Hoffmann's test bilaterally.  Skin:    General: Skin is warm and dry.     Findings: No erythema or rash.  Neurological:     General: No focal deficit present.     Mental Status: She is alert and oriented to person, place, and time.     Motor: No weakness or abnormal muscle tone.     Coordination: Coordination normal.  Psychiatric:        Mood and Affect: Mood normal.        Behavior: Behavior normal.     Imaging: No results found.

## 2021-06-10 NOTE — Procedures (Signed)
EMG & NCV Findings: Evaluation of the right median motor nerve showed prolonged distal onset latency (7.1 ms), reduced amplitude (3.8 mV), and decreased conduction velocity (Elbow-Wrist, 37 m/s).  The right median (across palm) sensory nerve showed prolonged distal peak latency (Wrist, 7.1 ms), reduced amplitude (7.4 V), and prolonged distal peak latency (Palm, 4.2 ms).  All remaining nerves (as indicated in the following tables) were within normal limits.    All examined muscles (as indicated in the following table) showed no evidence of electrical instability.    Impression: The above electrodiagnostic study is ABNORMAL and reveals evidence of a severe right median nerve entrapment at the wrist (carpal tunnel syndrome) affecting sensory and motor components.   There is no significant electrodiagnostic evidence of any other focal nerve entrapment, brachial plexopathy or cervical radiculopathy.   Recommendations: 1.  Follow-up with referring physician. 2.  Continue current management of symptoms. 3.  Continue use of resting splint at night-time and as needed during the day. 4.  Suggest surgical evaluation.  ___________________________ Laurence Spates FAAPMR Board Certified, American Board of Physical Medicine and Rehabilitation    Nerve Conduction Studies Anti Sensory Summary Table   Stim Site NR Peak (ms) Norm Peak (ms) P-T Amp (V) Norm P-T Amp Site1 Site2 Delta-P (ms) Dist (cm) Vel (m/s) Norm Vel (m/s)  Right Median Acr Palm Anti Sensory (2nd Digit)  32C  Wrist    *7.1 <3.6 *7.4 >10 Wrist Palm 2.9 0.0    Palm    *4.2 <2.0 1.7         Right Radial Anti Sensory (Base 1st Digit)  32.3C  Wrist    1.9 <3.1 24.5  Wrist Base 1st Digit 1.9 0.0    Right Ulnar Anti Sensory (5th Digit)  32.4C  Wrist    2.9 <3.7 20.7 >15.0 Wrist 5th Digit 2.9 14.0 48 >38   Motor Summary Table   Stim Site NR Onset (ms) Norm Onset (ms) O-P Amp (mV) Norm O-P Amp Site1 Site2 Delta-0 (ms) Dist (cm) Vel (m/s)  Norm Vel (m/s)  Right Median Motor (Abd Poll Brev)  32.5C    Martin-Gruber  Wrist    *7.1 <4.2 *3.8 >5 Elbow Wrist 4.8 18.0 *37 >50  Elbow    11.9  3.7         Right Ulnar Motor (Abd Dig Min)  32.8C  Wrist    2.7 <4.2 8.2 >3 B Elbow Wrist 2.5 17.0 68 >53  B Elbow    5.2  8.3  A Elbow B Elbow 1.1 10.0 91 >53  A Elbow    6.3  7.7          EMG   Side Muscle Nerve Root Ins Act Fibs Psw Amp Dur Poly Recrt Int Fraser Din Comment  Right Abd Poll Brev Median C8-T1 Nml Nml Nml Nml Nml 0 Nml Nml   Right 1stDorInt Ulnar C8-T1 Nml Nml Nml Nml Nml 0 Nml Nml   Right PronatorTeres Median C6-7 Nml Nml Nml Nml Nml 0 Nml Nml   Right Biceps Musculocut C5-6 Nml Nml Nml Nml Nml 0 Nml Nml   Right Deltoid Axillary C5-6 Nml Nml Nml Nml Nml 0 Nml Nml     Nerve Conduction Studies Anti Sensory Left/Right Comparison   Stim Site L Lat (ms) R Lat (ms) L-R Lat (ms) L Amp (V) R Amp (V) L-R Amp (%) Site1 Site2 L Vel (m/s) R Vel (m/s) L-R Vel (m/s)  Median Acr Palm Anti Sensory (2nd Digit)  32C  Wrist  *7.1   *7.4  Wrist Palm     Palm  *4.2   1.7        Radial Anti Sensory (Base 1st Digit)  32.3C  Wrist  1.9   24.5  Wrist Base 1st Digit     Ulnar Anti Sensory (5th Digit)  32.4C  Wrist  2.9   20.7  Wrist 5th Digit  48    Motor Left/Right Comparison   Stim Site L Lat (ms) R Lat (ms) L-R Lat (ms) L Amp (mV) R Amp (mV) L-R Amp (%) Site1 Site2 L Vel (m/s) R Vel (m/s) L-R Vel (m/s)  Median Motor (Abd Poll Brev)  32.5C    Martin-Gruber  Wrist  *7.1   *3.8  Elbow Wrist  *37   Elbow  11.9   3.7        Ulnar Motor (Abd Dig Min)  32.8C  Wrist  2.7   8.2  B Elbow Wrist  68   B Elbow  5.2   8.3  A Elbow B Elbow  91   A Elbow  6.3   7.7           Waveforms:

## 2021-06-23 ENCOUNTER — Ambulatory Visit (INDEPENDENT_AMBULATORY_CARE_PROVIDER_SITE_OTHER): Payer: Medicare Other | Admitting: Orthopaedic Surgery

## 2021-06-23 ENCOUNTER — Encounter: Payer: Self-pay | Admitting: Orthopaedic Surgery

## 2021-06-23 DIAGNOSIS — R200 Anesthesia of skin: Secondary | ICD-10-CM

## 2021-06-23 DIAGNOSIS — G5601 Carpal tunnel syndrome, right upper limb: Secondary | ICD-10-CM | POA: Diagnosis not present

## 2021-06-23 DIAGNOSIS — M25511 Pain in right shoulder: Secondary | ICD-10-CM | POA: Diagnosis not present

## 2021-06-23 NOTE — Progress Notes (Signed)
The patient is well-known to me.  She actually turned 68 yesterday.  She is been a long-term patient of mine.  She comes in today to go over nerve conduction studies of her right hand and upper extremity.  We actually performed a left carpal tunnel release on her remotely.  She has been having significant right hand numbness.  The EMG studies do confirm severe carpal tunnel syndrome with median nerve compression at the level of the wrist on the right side.  There is fortunately no atrophy in her hand but there is numbness and tingling and a positive Tinel's and Phalen's exam on the right upper extremity.  I did give her our surgery scheduler's card.  She is interested in having a right open carpal tunnel release at some point.  She is still been dealing with headaches and trigger point pain to the right trapezius area that goes up into her occiput and her neck as well as her head.  She is actually seeing neurology I believe in a week or 2.  She would like to hold off on any surgery until after that visit.  I did talk to her about trying a trigger point injection in her right trapezius area and she agreed to this and I placed 1 cc of a steroid and 1 cc of lidocaine in this area which she tolerated well.  She will call us when she would like [place] to schedule her for right carpal tunnel release.
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
## 2021-07-02 ENCOUNTER — Other Ambulatory Visit: Payer: Self-pay

## 2021-07-02 ENCOUNTER — Encounter: Payer: Self-pay | Admitting: Neurology

## 2021-07-02 ENCOUNTER — Ambulatory Visit (INDEPENDENT_AMBULATORY_CARE_PROVIDER_SITE_OTHER): Payer: Medicare Other | Admitting: Neurology

## 2021-07-02 VITALS — BP 159/59 | HR 70 | Ht 63.0 in | Wt 199.0 lb

## 2021-07-02 DIAGNOSIS — G5601 Carpal tunnel syndrome, right upper limb: Secondary | ICD-10-CM

## 2021-07-02 DIAGNOSIS — G4486 Cervicogenic headache: Secondary | ICD-10-CM | POA: Diagnosis not present

## 2021-07-02 DIAGNOSIS — I10 Essential (primary) hypertension: Secondary | ICD-10-CM

## 2021-07-02 DIAGNOSIS — M47812 Spondylosis without myelopathy or radiculopathy, cervical region: Secondary | ICD-10-CM

## 2021-07-02 MED ORDER — GABAPENTIN 100 MG PO CAPS: 100.0000 mg | ORAL_CAPSULE | Freq: Every day | ORAL | 5 refills | Status: DC

## 2021-07-02 NOTE — Patient Instructions (Signed)
Start gabapentin 100mg  - take 1 pill at bedtime.  If no improvement in 2 weeks, contact me and we can increase dose  Follow up 4 months

## 2021-07-04 ENCOUNTER — Other Ambulatory Visit (INDEPENDENT_AMBULATORY_CARE_PROVIDER_SITE_OTHER): Payer: Self-pay | Admitting: Orthopaedic Surgery

## 2021-07-07 ENCOUNTER — Other Ambulatory Visit: Payer: Self-pay | Admitting: Family Medicine

## 2021-07-07 DIAGNOSIS — F341 Dysthymic disorder: Secondary | ICD-10-CM

## 2021-07-07 DIAGNOSIS — G4700 Insomnia, unspecified: Secondary | ICD-10-CM

## 2021-08-03 ENCOUNTER — Other Ambulatory Visit (INDEPENDENT_AMBULATORY_CARE_PROVIDER_SITE_OTHER): Payer: Self-pay | Admitting: Orthopaedic Surgery

## 2021-08-23 ENCOUNTER — Ambulatory Visit: Payer: Medicare Other | Admitting: Neurology

## 2021-08-25 ENCOUNTER — Ambulatory Visit (INDEPENDENT_AMBULATORY_CARE_PROVIDER_SITE_OTHER): Payer: Medicare Other | Admitting: Family Medicine

## 2021-08-25 ENCOUNTER — Other Ambulatory Visit: Payer: Self-pay

## 2021-08-25 ENCOUNTER — Encounter: Payer: Self-pay | Admitting: Family Medicine

## 2021-08-25 VITALS — BP 149/48 | HR 56 | Wt 197.0 lb

## 2021-08-25 DIAGNOSIS — M25511 Pain in right shoulder: Secondary | ICD-10-CM | POA: Diagnosis not present

## 2021-08-25 DIAGNOSIS — G8929 Other chronic pain: Secondary | ICD-10-CM

## 2021-08-25 DIAGNOSIS — M47812 Spondylosis without myelopathy or radiculopathy, cervical region: Secondary | ICD-10-CM

## 2021-08-25 DIAGNOSIS — G5601 Carpal tunnel syndrome, right upper limb: Secondary | ICD-10-CM | POA: Diagnosis not present

## 2021-08-26 ENCOUNTER — Encounter: Payer: Self-pay | Admitting: Family Medicine

## 2021-08-26 DIAGNOSIS — G8929 Other chronic pain: Secondary | ICD-10-CM | POA: Insufficient documentation

## 2021-08-26 NOTE — Addendum Note (Signed)
Addended byDorcas Mcmurray L on: 08/26/2021 12:19 PM ? ? Modules accepted: Orders ? ?

## 2021-08-26 NOTE — Assessment & Plan Note (Signed)
Discussed options with her.  If the gabapentin is significantly making improvements in this, she might be able to avoid surgery. ?

## 2021-08-26 NOTE — Assessment & Plan Note (Signed)
She is pleased with the improvement with the addition of gabapentin for cervical spine arthritis.  She will continue to follow with neurology regarding this but if they want to release her once they find a an appropriate dose, I will be happy to continue providing gabapentin for this.  Notably we had tried this at 1 point initially but she was not having great luck with that initially.  Glad this is working for her now. ?

## 2021-08-26 NOTE — Assessment & Plan Note (Signed)
She has significant improvement from her corticosteroid injection into the subacromial bursa greater than 12 months ago.  We will repeat today.  She will follow-up 1 month if not improving. ?

## 2021-08-26 NOTE — Progress Notes (Signed)
? ? ?  CHIEF COMPLAINT / HPI: ?Right shoulder pain.  Had an injection more than a year ago and it really helped.  Would like to have injection again. ?Pain bothering her all day and difficult to sleep at night.  Aching in nature.  Worse with overhead movements.  Right-hand-dominant.  No numbness or tingling in the hand. ? ?#2.  Arthritis of cervical spine: She was seen by neurosurgery and then referred to neurology who started her on gabapentin.  It is significantly improving her symptoms.  She has follow-up with them. ?3.  Has been having carpal tunnel symptoms and has seen her orthopedist.  They are contemplating doing open carpal tunnel procedure although the gabapentin they started for her neck pain does seem to be helping this some.  She has follow-up with them. ? ?PERTINENT  PMH / PSH: I have reviewed the patient?s medications, allergies, past medical and surgical history, smoking status and updated in the EMR as appropriate. ? ? ?OBJECTIVE: ? BP (!) 149/48   Pulse (!) 56   Wt 197 lb (89.4 kg)   BMI 34.90 kg/m?  ?GENERAL: Well-developed female no acute distress ?shoulders: Symmetrical.  Right shoulder painful range of motion above 90 degrees forward flexion 110 degrees abduction.  Bicipital tendon is nontender.  Distally she is neurovascular intact. ? ?PROCEDURE: INJECTION: ?Patient was given informed consent, signed copy in the chart. Appropriate time out was taken. Area prepped and draped in usual sterile fashion. Ethyl chloride was  used for local anesthesia. A 21 gauge 1 1/2 inch needle was used..  1 cc of methylprednisolone 40 mg/ml plus 4 cc of 1% lidocaine without epinephrine was injected into the right subacromial bursa using a(n) posterior approach.  ? ?The patient tolerated the procedure well. There were no complications. Post procedure instructions were given. ? ? ?ASSESSMENT / PLAN: ? ? 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  She will continue to follow with neurology regarding this but if they want to release her once they find a an appropriate dose, I will be happy to continue providing gabapentin for this.  Notably we had tried this at 1 point initially but she was not having great luck with that initially.  Glad this is working for her now. ? ?Carpal tunnel syndrome, right upper limb ?Discussed options with her.  If the gabapentin is significantly making improvements in this, she might be able to avoid surgery. ? ?Chronic right shoulder pain ?She has significant improvement from her corticosteroid injection into the subacromial bursa greater than 12 months ago.  We will repeat today.  She will follow-up 1 month if not improving. ?  ?Dorcas Mcmurray MD ?

## 2021-09-01 DIAGNOSIS — M5136 Other intervertebral disc degeneration, lumbar region: Secondary | ICD-10-CM | POA: Diagnosis not present

## 2021-09-01 DIAGNOSIS — M50323 Other cervical disc degeneration at C6-C7 level: Secondary | ICD-10-CM | POA: Diagnosis not present

## 2021-09-01 DIAGNOSIS — M5412 Radiculopathy, cervical region: Secondary | ICD-10-CM | POA: Diagnosis not present

## 2021-09-01 DIAGNOSIS — M5135 Other intervertebral disc degeneration, thoracolumbar region: Secondary | ICD-10-CM | POA: Diagnosis not present

## 2021-09-01 DIAGNOSIS — M50322 Other cervical disc degeneration at C5-C6 level: Secondary | ICD-10-CM | POA: Diagnosis not present

## 2021-09-01 DIAGNOSIS — R519 Headache, unspecified: Secondary | ICD-10-CM | POA: Diagnosis not present

## 2021-09-01 DIAGNOSIS — M9903 Segmental and somatic dysfunction of lumbar region: Secondary | ICD-10-CM | POA: Diagnosis not present

## 2021-09-01 DIAGNOSIS — M5031 Other cervical disc degeneration,  high cervical region: Secondary | ICD-10-CM | POA: Diagnosis not present

## 2021-09-01 DIAGNOSIS — M5134 Other intervertebral disc degeneration, thoracic region: Secondary | ICD-10-CM | POA: Diagnosis not present

## 2021-09-01 DIAGNOSIS — M9902 Segmental and somatic dysfunction of thoracic region: Secondary | ICD-10-CM | POA: Diagnosis not present

## 2021-09-01 DIAGNOSIS — M50321 Other cervical disc degeneration at C4-C5 level: Secondary | ICD-10-CM | POA: Diagnosis not present

## 2021-09-01 DIAGNOSIS — M9901 Segmental and somatic dysfunction of cervical region: Secondary | ICD-10-CM | POA: Diagnosis not present

## 2021-09-02 ENCOUNTER — Other Ambulatory Visit (INDEPENDENT_AMBULATORY_CARE_PROVIDER_SITE_OTHER): Payer: Self-pay | Admitting: Orthopaedic Surgery

## 2021-09-02 DIAGNOSIS — R519 Headache, unspecified: Secondary | ICD-10-CM | POA: Diagnosis not present

## 2021-09-02 DIAGNOSIS — M50321 Other cervical disc degeneration at C4-C5 level: Secondary | ICD-10-CM | POA: Diagnosis not present

## 2021-09-02 DIAGNOSIS — M5136 Other intervertebral disc degeneration, lumbar region: Secondary | ICD-10-CM | POA: Diagnosis not present

## 2021-09-02 DIAGNOSIS — M5031 Other cervical disc degeneration,  high cervical region: Secondary | ICD-10-CM | POA: Diagnosis not present

## 2021-09-02 DIAGNOSIS — M5412 Radiculopathy, cervical region: Secondary | ICD-10-CM | POA: Diagnosis not present

## 2021-09-02 DIAGNOSIS — M50322 Other cervical disc degeneration at C5-C6 level: Secondary | ICD-10-CM | POA: Diagnosis not present

## 2021-09-02 DIAGNOSIS — M5135 Other intervertebral disc degeneration, thoracolumbar region: Secondary | ICD-10-CM | POA: Diagnosis not present

## 2021-09-02 DIAGNOSIS — M9901 Segmental and somatic dysfunction of cervical region: Secondary | ICD-10-CM | POA: Diagnosis not present

## 2021-09-02 DIAGNOSIS — M5134 Other intervertebral disc degeneration, thoracic region: Secondary | ICD-10-CM | POA: Diagnosis not present

## 2021-09-02 DIAGNOSIS — M9903 Segmental and somatic dysfunction of lumbar region: Secondary | ICD-10-CM | POA: Diagnosis not present

## 2021-09-02 DIAGNOSIS — M9902 Segmental and somatic dysfunction of thoracic region: Secondary | ICD-10-CM | POA: Diagnosis not present

## 2021-09-02 DIAGNOSIS — M50323 Other cervical disc degeneration at C6-C7 level: Secondary | ICD-10-CM | POA: Diagnosis not present

## 2021-09-06 DIAGNOSIS — M5134 Other intervertebral disc degeneration, thoracic region: Secondary | ICD-10-CM | POA: Diagnosis not present

## 2021-09-06 DIAGNOSIS — M9903 Segmental and somatic dysfunction of lumbar region: Secondary | ICD-10-CM | POA: Diagnosis not present

## 2021-09-06 DIAGNOSIS — M5031 Other cervical disc degeneration,  high cervical region: Secondary | ICD-10-CM | POA: Diagnosis not present

## 2021-09-06 DIAGNOSIS — M5136 Other intervertebral disc degeneration, lumbar region: Secondary | ICD-10-CM | POA: Diagnosis not present

## 2021-09-06 DIAGNOSIS — M5135 Other intervertebral disc degeneration, thoracolumbar region: Secondary | ICD-10-CM | POA: Diagnosis not present

## 2021-09-06 DIAGNOSIS — R519 Headache, unspecified: Secondary | ICD-10-CM | POA: Diagnosis not present

## 2021-09-06 DIAGNOSIS — M9902 Segmental and somatic dysfunction of thoracic region: Secondary | ICD-10-CM | POA: Diagnosis not present

## 2021-09-06 DIAGNOSIS — M50321 Other cervical disc degeneration at C4-C5 level: Secondary | ICD-10-CM | POA: Diagnosis not present

## 2021-09-06 DIAGNOSIS — M50323 Other cervical disc degeneration at C6-C7 level: Secondary | ICD-10-CM | POA: Diagnosis not present

## 2021-09-06 DIAGNOSIS — M50322 Other cervical disc degeneration at C5-C6 level: Secondary | ICD-10-CM | POA: Diagnosis not present

## 2021-09-06 DIAGNOSIS — M9901 Segmental and somatic dysfunction of cervical region: Secondary | ICD-10-CM | POA: Diagnosis not present

## 2021-09-06 DIAGNOSIS — M5412 Radiculopathy, cervical region: Secondary | ICD-10-CM | POA: Diagnosis not present

## 2021-09-07 ENCOUNTER — Other Ambulatory Visit: Payer: Self-pay | Admitting: Family Medicine

## 2021-09-07 DIAGNOSIS — M5136 Other intervertebral disc degeneration, lumbar region: Secondary | ICD-10-CM | POA: Diagnosis not present

## 2021-09-07 DIAGNOSIS — F341 Dysthymic disorder: Secondary | ICD-10-CM

## 2021-09-07 DIAGNOSIS — M5135 Other intervertebral disc degeneration, thoracolumbar region: Secondary | ICD-10-CM | POA: Diagnosis not present

## 2021-09-07 DIAGNOSIS — M50323 Other cervical disc degeneration at C6-C7 level: Secondary | ICD-10-CM | POA: Diagnosis not present

## 2021-09-07 DIAGNOSIS — M9901 Segmental and somatic dysfunction of cervical region: Secondary | ICD-10-CM | POA: Diagnosis not present

## 2021-09-07 DIAGNOSIS — R519 Headache, unspecified: Secondary | ICD-10-CM | POA: Diagnosis not present

## 2021-09-07 DIAGNOSIS — M5031 Other cervical disc degeneration,  high cervical region: Secondary | ICD-10-CM | POA: Diagnosis not present

## 2021-09-07 DIAGNOSIS — M5412 Radiculopathy, cervical region: Secondary | ICD-10-CM | POA: Diagnosis not present

## 2021-09-07 DIAGNOSIS — G4700 Insomnia, unspecified: Secondary | ICD-10-CM

## 2021-09-07 DIAGNOSIS — M9903 Segmental and somatic dysfunction of lumbar region: Secondary | ICD-10-CM | POA: Diagnosis not present

## 2021-09-07 DIAGNOSIS — M50322 Other cervical disc degeneration at C5-C6 level: Secondary | ICD-10-CM | POA: Diagnosis not present

## 2021-09-07 DIAGNOSIS — M50321 Other cervical disc degeneration at C4-C5 level: Secondary | ICD-10-CM | POA: Diagnosis not present

## 2021-09-07 DIAGNOSIS — M9902 Segmental and somatic dysfunction of thoracic region: Secondary | ICD-10-CM | POA: Diagnosis not present

## 2021-09-07 DIAGNOSIS — M5134 Other intervertebral disc degeneration, thoracic region: Secondary | ICD-10-CM | POA: Diagnosis not present

## 2021-09-09 DIAGNOSIS — M5134 Other intervertebral disc degeneration, thoracic region: Secondary | ICD-10-CM | POA: Diagnosis not present

## 2021-09-09 DIAGNOSIS — M50321 Other cervical disc degeneration at C4-C5 level: Secondary | ICD-10-CM | POA: Diagnosis not present

## 2021-09-09 DIAGNOSIS — M9902 Segmental and somatic dysfunction of thoracic region: Secondary | ICD-10-CM | POA: Diagnosis not present

## 2021-09-09 DIAGNOSIS — R519 Headache, unspecified: Secondary | ICD-10-CM | POA: Diagnosis not present

## 2021-09-09 DIAGNOSIS — M9901 Segmental and somatic dysfunction of cervical region: Secondary | ICD-10-CM | POA: Diagnosis not present

## 2021-09-09 DIAGNOSIS — M5031 Other cervical disc degeneration,  high cervical region: Secondary | ICD-10-CM | POA: Diagnosis not present

## 2021-09-09 DIAGNOSIS — M50323 Other cervical disc degeneration at C6-C7 level: Secondary | ICD-10-CM | POA: Diagnosis not present

## 2021-09-09 DIAGNOSIS — M50322 Other cervical disc degeneration at C5-C6 level: Secondary | ICD-10-CM | POA: Diagnosis not present

## 2021-09-09 DIAGNOSIS — M5135 Other intervertebral disc degeneration, thoracolumbar region: Secondary | ICD-10-CM | POA: Diagnosis not present

## 2021-09-09 DIAGNOSIS — M5412 Radiculopathy, cervical region: Secondary | ICD-10-CM | POA: Diagnosis not present

## 2021-09-09 DIAGNOSIS — M5136 Other intervertebral disc degeneration, lumbar region: Secondary | ICD-10-CM | POA: Diagnosis not present

## 2021-09-09 DIAGNOSIS — M9903 Segmental and somatic dysfunction of lumbar region: Secondary | ICD-10-CM | POA: Diagnosis not present

## 2021-09-13 DIAGNOSIS — M50321 Other cervical disc degeneration at C4-C5 level: Secondary | ICD-10-CM | POA: Diagnosis not present

## 2021-09-13 DIAGNOSIS — M5136 Other intervertebral disc degeneration, lumbar region: Secondary | ICD-10-CM | POA: Diagnosis not present

## 2021-09-13 DIAGNOSIS — M9903 Segmental and somatic dysfunction of lumbar region: Secondary | ICD-10-CM | POA: Diagnosis not present

## 2021-09-13 DIAGNOSIS — M50323 Other cervical disc degeneration at C6-C7 level: Secondary | ICD-10-CM | POA: Diagnosis not present

## 2021-09-13 DIAGNOSIS — M5134 Other intervertebral disc degeneration, thoracic region: Secondary | ICD-10-CM | POA: Diagnosis not present

## 2021-09-13 DIAGNOSIS — M5412 Radiculopathy, cervical region: Secondary | ICD-10-CM | POA: Diagnosis not present

## 2021-09-13 DIAGNOSIS — M9902 Segmental and somatic dysfunction of thoracic region: Secondary | ICD-10-CM | POA: Diagnosis not present

## 2021-09-13 DIAGNOSIS — M50322 Other cervical disc degeneration at C5-C6 level: Secondary | ICD-10-CM | POA: Diagnosis not present

## 2021-09-13 DIAGNOSIS — M5031 Other cervical disc degeneration,  high cervical region: Secondary | ICD-10-CM | POA: Diagnosis not present

## 2021-09-13 DIAGNOSIS — M9901 Segmental and somatic dysfunction of cervical region: Secondary | ICD-10-CM | POA: Diagnosis not present

## 2021-09-13 DIAGNOSIS — R519 Headache, unspecified: Secondary | ICD-10-CM | POA: Diagnosis not present

## 2021-09-13 DIAGNOSIS — M5135 Other intervertebral disc degeneration, thoracolumbar region: Secondary | ICD-10-CM | POA: Diagnosis not present

## 2021-09-14 DIAGNOSIS — M5134 Other intervertebral disc degeneration, thoracic region: Secondary | ICD-10-CM | POA: Diagnosis not present

## 2021-09-14 DIAGNOSIS — M50322 Other cervical disc degeneration at C5-C6 level: Secondary | ICD-10-CM | POA: Diagnosis not present

## 2021-09-14 DIAGNOSIS — M9901 Segmental and somatic dysfunction of cervical region: Secondary | ICD-10-CM | POA: Diagnosis not present

## 2021-09-14 DIAGNOSIS — M50321 Other cervical disc degeneration at C4-C5 level: Secondary | ICD-10-CM | POA: Diagnosis not present

## 2021-09-14 DIAGNOSIS — M5031 Other cervical disc degeneration,  high cervical region: Secondary | ICD-10-CM | POA: Diagnosis not present

## 2021-09-14 DIAGNOSIS — M5135 Other intervertebral disc degeneration, thoracolumbar region: Secondary | ICD-10-CM | POA: Diagnosis not present

## 2021-09-14 DIAGNOSIS — R519 Headache, unspecified: Secondary | ICD-10-CM | POA: Diagnosis not present

## 2021-09-14 DIAGNOSIS — M9903 Segmental and somatic dysfunction of lumbar region: Secondary | ICD-10-CM | POA: Diagnosis not present

## 2021-09-14 DIAGNOSIS — M50323 Other cervical disc degeneration at C6-C7 level: Secondary | ICD-10-CM | POA: Diagnosis not present

## 2021-09-14 DIAGNOSIS — M5412 Radiculopathy, cervical region: Secondary | ICD-10-CM | POA: Diagnosis not present

## 2021-09-14 DIAGNOSIS — M5136 Other intervertebral disc degeneration, lumbar region: Secondary | ICD-10-CM | POA: Diagnosis not present

## 2021-09-14 DIAGNOSIS — M9902 Segmental and somatic dysfunction of thoracic region: Secondary | ICD-10-CM | POA: Diagnosis not present

## 2021-09-16 DIAGNOSIS — M5135 Other intervertebral disc degeneration, thoracolumbar region: Secondary | ICD-10-CM | POA: Diagnosis not present

## 2021-09-16 DIAGNOSIS — M50321 Other cervical disc degeneration at C4-C5 level: Secondary | ICD-10-CM | POA: Diagnosis not present

## 2021-09-16 DIAGNOSIS — M9901 Segmental and somatic dysfunction of cervical region: Secondary | ICD-10-CM | POA: Diagnosis not present

## 2021-09-16 DIAGNOSIS — M50322 Other cervical disc degeneration at C5-C6 level: Secondary | ICD-10-CM | POA: Diagnosis not present

## 2021-09-16 DIAGNOSIS — M5136 Other intervertebral disc degeneration, lumbar region: Secondary | ICD-10-CM | POA: Diagnosis not present

## 2021-09-16 DIAGNOSIS — M9903 Segmental and somatic dysfunction of lumbar region: Secondary | ICD-10-CM | POA: Diagnosis not present

## 2021-09-16 DIAGNOSIS — M5134 Other intervertebral disc degeneration, thoracic region: Secondary | ICD-10-CM | POA: Diagnosis not present

## 2021-09-16 DIAGNOSIS — M50323 Other cervical disc degeneration at C6-C7 level: Secondary | ICD-10-CM | POA: Diagnosis not present

## 2021-09-16 DIAGNOSIS — M9902 Segmental and somatic dysfunction of thoracic region: Secondary | ICD-10-CM | POA: Diagnosis not present

## 2021-09-16 DIAGNOSIS — M5031 Other cervical disc degeneration,  high cervical region: Secondary | ICD-10-CM | POA: Diagnosis not present

## 2021-09-16 DIAGNOSIS — M5412 Radiculopathy, cervical region: Secondary | ICD-10-CM | POA: Diagnosis not present

## 2021-09-16 DIAGNOSIS — R519 Headache, unspecified: Secondary | ICD-10-CM | POA: Diagnosis not present

## 2021-09-20 DIAGNOSIS — M50322 Other cervical disc degeneration at C5-C6 level: Secondary | ICD-10-CM | POA: Diagnosis not present

## 2021-09-20 DIAGNOSIS — R519 Headache, unspecified: Secondary | ICD-10-CM | POA: Diagnosis not present

## 2021-09-20 DIAGNOSIS — M50321 Other cervical disc degeneration at C4-C5 level: Secondary | ICD-10-CM | POA: Diagnosis not present

## 2021-09-20 DIAGNOSIS — M9903 Segmental and somatic dysfunction of lumbar region: Secondary | ICD-10-CM | POA: Diagnosis not present

## 2021-09-20 DIAGNOSIS — M5135 Other intervertebral disc degeneration, thoracolumbar region: Secondary | ICD-10-CM | POA: Diagnosis not present

## 2021-09-20 DIAGNOSIS — M9901 Segmental and somatic dysfunction of cervical region: Secondary | ICD-10-CM | POA: Diagnosis not present

## 2021-09-20 DIAGNOSIS — M5412 Radiculopathy, cervical region: Secondary | ICD-10-CM | POA: Diagnosis not present

## 2021-09-20 DIAGNOSIS — M5136 Other intervertebral disc degeneration, lumbar region: Secondary | ICD-10-CM | POA: Diagnosis not present

## 2021-09-20 DIAGNOSIS — M5031 Other cervical disc degeneration,  high cervical region: Secondary | ICD-10-CM | POA: Diagnosis not present

## 2021-09-20 DIAGNOSIS — M5134 Other intervertebral disc degeneration, thoracic region: Secondary | ICD-10-CM | POA: Diagnosis not present

## 2021-09-20 DIAGNOSIS — M9902 Segmental and somatic dysfunction of thoracic region: Secondary | ICD-10-CM | POA: Diagnosis not present

## 2021-09-20 DIAGNOSIS — M50323 Other cervical disc degeneration at C6-C7 level: Secondary | ICD-10-CM | POA: Diagnosis not present

## 2021-09-21 DIAGNOSIS — M9901 Segmental and somatic dysfunction of cervical region: Secondary | ICD-10-CM | POA: Diagnosis not present

## 2021-09-21 DIAGNOSIS — M50323 Other cervical disc degeneration at C6-C7 level: Secondary | ICD-10-CM | POA: Diagnosis not present

## 2021-09-21 DIAGNOSIS — M9903 Segmental and somatic dysfunction of lumbar region: Secondary | ICD-10-CM | POA: Diagnosis not present

## 2021-09-21 DIAGNOSIS — M5136 Other intervertebral disc degeneration, lumbar region: Secondary | ICD-10-CM | POA: Diagnosis not present

## 2021-09-21 DIAGNOSIS — M9902 Segmental and somatic dysfunction of thoracic region: Secondary | ICD-10-CM | POA: Diagnosis not present

## 2021-09-21 DIAGNOSIS — M5031 Other cervical disc degeneration,  high cervical region: Secondary | ICD-10-CM | POA: Diagnosis not present

## 2021-09-21 DIAGNOSIS — R519 Headache, unspecified: Secondary | ICD-10-CM | POA: Diagnosis not present

## 2021-09-21 DIAGNOSIS — M50322 Other cervical disc degeneration at C5-C6 level: Secondary | ICD-10-CM | POA: Diagnosis not present

## 2021-09-21 DIAGNOSIS — M5134 Other intervertebral disc degeneration, thoracic region: Secondary | ICD-10-CM | POA: Diagnosis not present

## 2021-09-21 DIAGNOSIS — M5412 Radiculopathy, cervical region: Secondary | ICD-10-CM | POA: Diagnosis not present

## 2021-09-21 DIAGNOSIS — M5135 Other intervertebral disc degeneration, thoracolumbar region: Secondary | ICD-10-CM | POA: Diagnosis not present

## 2021-09-21 DIAGNOSIS — M50321 Other cervical disc degeneration at C4-C5 level: Secondary | ICD-10-CM | POA: Diagnosis not present

## 2021-09-23 DIAGNOSIS — M9901 Segmental and somatic dysfunction of cervical region: Secondary | ICD-10-CM | POA: Diagnosis not present

## 2021-09-23 DIAGNOSIS — M50323 Other cervical disc degeneration at C6-C7 level: Secondary | ICD-10-CM | POA: Diagnosis not present

## 2021-09-23 DIAGNOSIS — M5412 Radiculopathy, cervical region: Secondary | ICD-10-CM | POA: Diagnosis not present

## 2021-09-23 DIAGNOSIS — M50322 Other cervical disc degeneration at C5-C6 level: Secondary | ICD-10-CM | POA: Diagnosis not present

## 2021-09-23 DIAGNOSIS — M5135 Other intervertebral disc degeneration, thoracolumbar region: Secondary | ICD-10-CM | POA: Diagnosis not present

## 2021-09-23 DIAGNOSIS — M9902 Segmental and somatic dysfunction of thoracic region: Secondary | ICD-10-CM | POA: Diagnosis not present

## 2021-09-23 DIAGNOSIS — M5136 Other intervertebral disc degeneration, lumbar region: Secondary | ICD-10-CM | POA: Diagnosis not present

## 2021-09-23 DIAGNOSIS — R519 Headache, unspecified: Secondary | ICD-10-CM | POA: Diagnosis not present

## 2021-09-23 DIAGNOSIS — M9903 Segmental and somatic dysfunction of lumbar region: Secondary | ICD-10-CM | POA: Diagnosis not present

## 2021-09-23 DIAGNOSIS — M5134 Other intervertebral disc degeneration, thoracic region: Secondary | ICD-10-CM | POA: Diagnosis not present

## 2021-09-23 DIAGNOSIS — M5031 Other cervical disc degeneration,  high cervical region: Secondary | ICD-10-CM | POA: Diagnosis not present

## 2021-09-23 DIAGNOSIS — M50321 Other cervical disc degeneration at C4-C5 level: Secondary | ICD-10-CM | POA: Diagnosis not present

## 2021-09-27 DIAGNOSIS — R519 Headache, unspecified: Secondary | ICD-10-CM | POA: Diagnosis not present

## 2021-09-27 DIAGNOSIS — M9902 Segmental and somatic dysfunction of thoracic region: Secondary | ICD-10-CM | POA: Diagnosis not present

## 2021-09-27 DIAGNOSIS — M9903 Segmental and somatic dysfunction of lumbar region: Secondary | ICD-10-CM | POA: Diagnosis not present

## 2021-09-27 DIAGNOSIS — M50323 Other cervical disc degeneration at C6-C7 level: Secondary | ICD-10-CM | POA: Diagnosis not present

## 2021-09-27 DIAGNOSIS — M5136 Other intervertebral disc degeneration, lumbar region: Secondary | ICD-10-CM | POA: Diagnosis not present

## 2021-09-27 DIAGNOSIS — M5031 Other cervical disc degeneration,  high cervical region: Secondary | ICD-10-CM | POA: Diagnosis not present

## 2021-09-27 DIAGNOSIS — M5412 Radiculopathy, cervical region: Secondary | ICD-10-CM | POA: Diagnosis not present

## 2021-09-27 DIAGNOSIS — M50322 Other cervical disc degeneration at C5-C6 level: Secondary | ICD-10-CM | POA: Diagnosis not present

## 2021-09-27 DIAGNOSIS — M5135 Other intervertebral disc degeneration, thoracolumbar region: Secondary | ICD-10-CM | POA: Diagnosis not present

## 2021-09-27 DIAGNOSIS — M9901 Segmental and somatic dysfunction of cervical region: Secondary | ICD-10-CM | POA: Diagnosis not present

## 2021-09-27 DIAGNOSIS — M50321 Other cervical disc degeneration at C4-C5 level: Secondary | ICD-10-CM | POA: Diagnosis not present

## 2021-09-27 DIAGNOSIS — M5134 Other intervertebral disc degeneration, thoracic region: Secondary | ICD-10-CM | POA: Diagnosis not present

## 2021-09-28 DIAGNOSIS — M9901 Segmental and somatic dysfunction of cervical region: Secondary | ICD-10-CM | POA: Diagnosis not present

## 2021-09-28 DIAGNOSIS — M5136 Other intervertebral disc degeneration, lumbar region: Secondary | ICD-10-CM | POA: Diagnosis not present

## 2021-09-28 DIAGNOSIS — M5134 Other intervertebral disc degeneration, thoracic region: Secondary | ICD-10-CM | POA: Diagnosis not present

## 2021-09-28 DIAGNOSIS — M9903 Segmental and somatic dysfunction of lumbar region: Secondary | ICD-10-CM | POA: Diagnosis not present

## 2021-09-28 DIAGNOSIS — M50321 Other cervical disc degeneration at C4-C5 level: Secondary | ICD-10-CM | POA: Diagnosis not present

## 2021-09-28 DIAGNOSIS — M5135 Other intervertebral disc degeneration, thoracolumbar region: Secondary | ICD-10-CM | POA: Diagnosis not present

## 2021-09-28 DIAGNOSIS — M50323 Other cervical disc degeneration at C6-C7 level: Secondary | ICD-10-CM | POA: Diagnosis not present

## 2021-09-28 DIAGNOSIS — M50322 Other cervical disc degeneration at C5-C6 level: Secondary | ICD-10-CM | POA: Diagnosis not present

## 2021-09-28 DIAGNOSIS — M5031 Other cervical disc degeneration,  high cervical region: Secondary | ICD-10-CM | POA: Diagnosis not present

## 2021-09-28 DIAGNOSIS — R519 Headache, unspecified: Secondary | ICD-10-CM | POA: Diagnosis not present

## 2021-09-28 DIAGNOSIS — M9902 Segmental and somatic dysfunction of thoracic region: Secondary | ICD-10-CM | POA: Diagnosis not present

## 2021-09-28 DIAGNOSIS — M5412 Radiculopathy, cervical region: Secondary | ICD-10-CM | POA: Diagnosis not present

## 2021-09-30 DIAGNOSIS — R519 Headache, unspecified: Secondary | ICD-10-CM | POA: Diagnosis not present

## 2021-09-30 DIAGNOSIS — M5031 Other cervical disc degeneration,  high cervical region: Secondary | ICD-10-CM | POA: Diagnosis not present

## 2021-09-30 DIAGNOSIS — M50323 Other cervical disc degeneration at C6-C7 level: Secondary | ICD-10-CM | POA: Diagnosis not present

## 2021-09-30 DIAGNOSIS — M9902 Segmental and somatic dysfunction of thoracic region: Secondary | ICD-10-CM | POA: Diagnosis not present

## 2021-09-30 DIAGNOSIS — M5135 Other intervertebral disc degeneration, thoracolumbar region: Secondary | ICD-10-CM | POA: Diagnosis not present

## 2021-09-30 DIAGNOSIS — M50321 Other cervical disc degeneration at C4-C5 level: Secondary | ICD-10-CM | POA: Diagnosis not present

## 2021-09-30 DIAGNOSIS — M5136 Other intervertebral disc degeneration, lumbar region: Secondary | ICD-10-CM | POA: Diagnosis not present

## 2021-09-30 DIAGNOSIS — M50322 Other cervical disc degeneration at C5-C6 level: Secondary | ICD-10-CM | POA: Diagnosis not present

## 2021-09-30 DIAGNOSIS — M9903 Segmental and somatic dysfunction of lumbar region: Secondary | ICD-10-CM | POA: Diagnosis not present

## 2021-09-30 DIAGNOSIS — M5412 Radiculopathy, cervical region: Secondary | ICD-10-CM | POA: Diagnosis not present

## 2021-09-30 DIAGNOSIS — M9901 Segmental and somatic dysfunction of cervical region: Secondary | ICD-10-CM | POA: Diagnosis not present

## 2021-09-30 DIAGNOSIS — M5134 Other intervertebral disc degeneration, thoracic region: Secondary | ICD-10-CM | POA: Diagnosis not present

## 2021-10-05 ENCOUNTER — Other Ambulatory Visit: Payer: Self-pay | Admitting: Family Medicine

## 2021-10-05 DIAGNOSIS — M5134 Other intervertebral disc degeneration, thoracic region: Secondary | ICD-10-CM | POA: Diagnosis not present

## 2021-10-05 DIAGNOSIS — G4700 Insomnia, unspecified: Secondary | ICD-10-CM

## 2021-10-05 DIAGNOSIS — M5031 Other cervical disc degeneration,  high cervical region: Secondary | ICD-10-CM | POA: Diagnosis not present

## 2021-10-05 DIAGNOSIS — M5135 Other intervertebral disc degeneration, thoracolumbar region: Secondary | ICD-10-CM | POA: Diagnosis not present

## 2021-10-05 DIAGNOSIS — M9903 Segmental and somatic dysfunction of lumbar region: Secondary | ICD-10-CM | POA: Diagnosis not present

## 2021-10-05 DIAGNOSIS — M50323 Other cervical disc degeneration at C6-C7 level: Secondary | ICD-10-CM | POA: Diagnosis not present

## 2021-10-05 DIAGNOSIS — R519 Headache, unspecified: Secondary | ICD-10-CM | POA: Diagnosis not present

## 2021-10-05 DIAGNOSIS — M50322 Other cervical disc degeneration at C5-C6 level: Secondary | ICD-10-CM | POA: Diagnosis not present

## 2021-10-05 DIAGNOSIS — M5412 Radiculopathy, cervical region: Secondary | ICD-10-CM | POA: Diagnosis not present

## 2021-10-05 DIAGNOSIS — F341 Dysthymic disorder: Secondary | ICD-10-CM

## 2021-10-05 DIAGNOSIS — M9902 Segmental and somatic dysfunction of thoracic region: Secondary | ICD-10-CM | POA: Diagnosis not present

## 2021-10-05 DIAGNOSIS — M9901 Segmental and somatic dysfunction of cervical region: Secondary | ICD-10-CM | POA: Diagnosis not present

## 2021-10-05 DIAGNOSIS — M5136 Other intervertebral disc degeneration, lumbar region: Secondary | ICD-10-CM | POA: Diagnosis not present

## 2021-10-05 DIAGNOSIS — M50321 Other cervical disc degeneration at C4-C5 level: Secondary | ICD-10-CM | POA: Diagnosis not present

## 2021-10-07 DIAGNOSIS — M5031 Other cervical disc degeneration,  high cervical region: Secondary | ICD-10-CM | POA: Diagnosis not present

## 2021-10-07 DIAGNOSIS — M50322 Other cervical disc degeneration at C5-C6 level: Secondary | ICD-10-CM | POA: Diagnosis not present

## 2021-10-07 DIAGNOSIS — R519 Headache, unspecified: Secondary | ICD-10-CM | POA: Diagnosis not present

## 2021-10-07 DIAGNOSIS — M50323 Other cervical disc degeneration at C6-C7 level: Secondary | ICD-10-CM | POA: Diagnosis not present

## 2021-10-07 DIAGNOSIS — M9903 Segmental and somatic dysfunction of lumbar region: Secondary | ICD-10-CM | POA: Diagnosis not present

## 2021-10-07 DIAGNOSIS — M5135 Other intervertebral disc degeneration, thoracolumbar region: Secondary | ICD-10-CM | POA: Diagnosis not present

## 2021-10-07 DIAGNOSIS — M5412 Radiculopathy, cervical region: Secondary | ICD-10-CM | POA: Diagnosis not present

## 2021-10-07 DIAGNOSIS — M50321 Other cervical disc degeneration at C4-C5 level: Secondary | ICD-10-CM | POA: Diagnosis not present

## 2021-10-07 DIAGNOSIS — M5136 Other intervertebral disc degeneration, lumbar region: Secondary | ICD-10-CM | POA: Diagnosis not present

## 2021-10-07 DIAGNOSIS — M9902 Segmental and somatic dysfunction of thoracic region: Secondary | ICD-10-CM | POA: Diagnosis not present

## 2021-10-07 DIAGNOSIS — M5134 Other intervertebral disc degeneration, thoracic region: Secondary | ICD-10-CM | POA: Diagnosis not present

## 2021-10-07 DIAGNOSIS — M9901 Segmental and somatic dysfunction of cervical region: Secondary | ICD-10-CM | POA: Diagnosis not present

## 2021-10-12 DIAGNOSIS — M5134 Other intervertebral disc degeneration, thoracic region: Secondary | ICD-10-CM | POA: Diagnosis not present

## 2021-10-12 DIAGNOSIS — M9903 Segmental and somatic dysfunction of lumbar region: Secondary | ICD-10-CM | POA: Diagnosis not present

## 2021-10-12 DIAGNOSIS — M50323 Other cervical disc degeneration at C6-C7 level: Secondary | ICD-10-CM | POA: Diagnosis not present

## 2021-10-12 DIAGNOSIS — M5135 Other intervertebral disc degeneration, thoracolumbar region: Secondary | ICD-10-CM | POA: Diagnosis not present

## 2021-10-12 DIAGNOSIS — M5136 Other intervertebral disc degeneration, lumbar region: Secondary | ICD-10-CM | POA: Diagnosis not present

## 2021-10-12 DIAGNOSIS — R519 Headache, unspecified: Secondary | ICD-10-CM | POA: Diagnosis not present

## 2021-10-12 DIAGNOSIS — M50321 Other cervical disc degeneration at C4-C5 level: Secondary | ICD-10-CM | POA: Diagnosis not present

## 2021-10-12 DIAGNOSIS — M9902 Segmental and somatic dysfunction of thoracic region: Secondary | ICD-10-CM | POA: Diagnosis not present

## 2021-10-12 DIAGNOSIS — M5412 Radiculopathy, cervical region: Secondary | ICD-10-CM | POA: Diagnosis not present

## 2021-10-12 DIAGNOSIS — M5031 Other cervical disc degeneration,  high cervical region: Secondary | ICD-10-CM | POA: Diagnosis not present

## 2021-10-12 DIAGNOSIS — M50322 Other cervical disc degeneration at C5-C6 level: Secondary | ICD-10-CM | POA: Diagnosis not present

## 2021-10-12 DIAGNOSIS — M9901 Segmental and somatic dysfunction of cervical region: Secondary | ICD-10-CM | POA: Diagnosis not present

## 2021-10-14 DIAGNOSIS — M50323 Other cervical disc degeneration at C6-C7 level: Secondary | ICD-10-CM | POA: Diagnosis not present

## 2021-10-14 DIAGNOSIS — M5135 Other intervertebral disc degeneration, thoracolumbar region: Secondary | ICD-10-CM | POA: Diagnosis not present

## 2021-10-14 DIAGNOSIS — M50321 Other cervical disc degeneration at C4-C5 level: Secondary | ICD-10-CM | POA: Diagnosis not present

## 2021-10-14 DIAGNOSIS — R519 Headache, unspecified: Secondary | ICD-10-CM | POA: Diagnosis not present

## 2021-10-14 DIAGNOSIS — M9903 Segmental and somatic dysfunction of lumbar region: Secondary | ICD-10-CM | POA: Diagnosis not present

## 2021-10-14 DIAGNOSIS — M9902 Segmental and somatic dysfunction of thoracic region: Secondary | ICD-10-CM | POA: Diagnosis not present

## 2021-10-14 DIAGNOSIS — M5136 Other intervertebral disc degeneration, lumbar region: Secondary | ICD-10-CM | POA: Diagnosis not present

## 2021-10-14 DIAGNOSIS — M50322 Other cervical disc degeneration at C5-C6 level: Secondary | ICD-10-CM | POA: Diagnosis not present

## 2021-10-14 DIAGNOSIS — M5412 Radiculopathy, cervical region: Secondary | ICD-10-CM | POA: Diagnosis not present

## 2021-10-14 DIAGNOSIS — M5134 Other intervertebral disc degeneration, thoracic region: Secondary | ICD-10-CM | POA: Diagnosis not present

## 2021-10-14 DIAGNOSIS — M9901 Segmental and somatic dysfunction of cervical region: Secondary | ICD-10-CM | POA: Diagnosis not present

## 2021-10-14 DIAGNOSIS — M5031 Other cervical disc degeneration,  high cervical region: Secondary | ICD-10-CM | POA: Diagnosis not present

## 2021-10-19 DIAGNOSIS — M9902 Segmental and somatic dysfunction of thoracic region: Secondary | ICD-10-CM | POA: Diagnosis not present

## 2021-10-19 DIAGNOSIS — M50321 Other cervical disc degeneration at C4-C5 level: Secondary | ICD-10-CM | POA: Diagnosis not present

## 2021-10-19 DIAGNOSIS — M50323 Other cervical disc degeneration at C6-C7 level: Secondary | ICD-10-CM | POA: Diagnosis not present

## 2021-10-19 DIAGNOSIS — R519 Headache, unspecified: Secondary | ICD-10-CM | POA: Diagnosis not present

## 2021-10-19 DIAGNOSIS — M50322 Other cervical disc degeneration at C5-C6 level: Secondary | ICD-10-CM | POA: Diagnosis not present

## 2021-10-19 DIAGNOSIS — M5136 Other intervertebral disc degeneration, lumbar region: Secondary | ICD-10-CM | POA: Diagnosis not present

## 2021-10-19 DIAGNOSIS — M9903 Segmental and somatic dysfunction of lumbar region: Secondary | ICD-10-CM | POA: Diagnosis not present

## 2021-10-19 DIAGNOSIS — M5412 Radiculopathy, cervical region: Secondary | ICD-10-CM | POA: Diagnosis not present

## 2021-10-19 DIAGNOSIS — M5031 Other cervical disc degeneration,  high cervical region: Secondary | ICD-10-CM | POA: Diagnosis not present

## 2021-10-19 DIAGNOSIS — M5134 Other intervertebral disc degeneration, thoracic region: Secondary | ICD-10-CM | POA: Diagnosis not present

## 2021-10-19 DIAGNOSIS — M9901 Segmental and somatic dysfunction of cervical region: Secondary | ICD-10-CM | POA: Diagnosis not present

## 2021-10-19 DIAGNOSIS — M5135 Other intervertebral disc degeneration, thoracolumbar region: Secondary | ICD-10-CM | POA: Diagnosis not present

## 2021-10-21 DIAGNOSIS — M5136 Other intervertebral disc degeneration, lumbar region: Secondary | ICD-10-CM | POA: Diagnosis not present

## 2021-10-21 DIAGNOSIS — M5134 Other intervertebral disc degeneration, thoracic region: Secondary | ICD-10-CM | POA: Diagnosis not present

## 2021-10-21 DIAGNOSIS — R519 Headache, unspecified: Secondary | ICD-10-CM | POA: Diagnosis not present

## 2021-10-21 DIAGNOSIS — M9903 Segmental and somatic dysfunction of lumbar region: Secondary | ICD-10-CM | POA: Diagnosis not present

## 2021-10-21 DIAGNOSIS — M9902 Segmental and somatic dysfunction of thoracic region: Secondary | ICD-10-CM | POA: Diagnosis not present

## 2021-10-21 DIAGNOSIS — M9901 Segmental and somatic dysfunction of cervical region: Secondary | ICD-10-CM | POA: Diagnosis not present

## 2021-10-21 DIAGNOSIS — M5135 Other intervertebral disc degeneration, thoracolumbar region: Secondary | ICD-10-CM | POA: Diagnosis not present

## 2021-10-21 DIAGNOSIS — M50323 Other cervical disc degeneration at C6-C7 level: Secondary | ICD-10-CM | POA: Diagnosis not present

## 2021-10-21 DIAGNOSIS — M50322 Other cervical disc degeneration at C5-C6 level: Secondary | ICD-10-CM | POA: Diagnosis not present

## 2021-10-21 DIAGNOSIS — M50321 Other cervical disc degeneration at C4-C5 level: Secondary | ICD-10-CM | POA: Diagnosis not present

## 2021-10-21 DIAGNOSIS — M5412 Radiculopathy, cervical region: Secondary | ICD-10-CM | POA: Diagnosis not present

## 2021-10-21 DIAGNOSIS — M5031 Other cervical disc degeneration,  high cervical region: Secondary | ICD-10-CM | POA: Diagnosis not present
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
## 2021-11-02 ENCOUNTER — Ambulatory Visit (INDEPENDENT_AMBULATORY_CARE_PROVIDER_SITE_OTHER): Payer: Medicare Other | Admitting: Neurology

## 2021-11-02 ENCOUNTER — Encounter: Payer: Self-pay | Admitting: Neurology

## 2021-11-02 VITALS — BP 102/40 | HR 86 | Ht 63.0 in | Wt 194.2 lb

## 2021-11-02 DIAGNOSIS — G4486 Cervicogenic headache: Secondary | ICD-10-CM

## 2021-11-02 DIAGNOSIS — M47812 Spondylosis without myelopathy or radiculopathy, cervical region: Secondary | ICD-10-CM

## 2021-11-02 MED ORDER — GABAPENTIN 100 MG PO CAPS: ORAL_CAPSULE | ORAL | 5 refills | Status: DC

## 2021-11-02 NOTE — Patient Instructions (Signed)
Increase gabapentin '100mg'$  to 1 pill in morning and 2 pills at bedtime

## 2021-11-03 ENCOUNTER — Other Ambulatory Visit: Payer: Self-pay | Admitting: Family Medicine

## 2021-11-03 DIAGNOSIS — F341 Dysthymic disorder: Secondary | ICD-10-CM

## 2021-11-03 DIAGNOSIS — G4700 Insomnia, unspecified: Secondary | ICD-10-CM

## 2021-12-13 ENCOUNTER — Other Ambulatory Visit: Payer: Self-pay | Admitting: Family Medicine

## 2021-12-13 DIAGNOSIS — M542 Cervicalgia: Secondary | ICD-10-CM

## 2022-01-14 ENCOUNTER — Other Ambulatory Visit: Payer: Self-pay | Admitting: Family Medicine

## 2022-01-14 DIAGNOSIS — I10 Essential (primary) hypertension: Secondary | ICD-10-CM

## 2022-01-17 ENCOUNTER — Encounter: Payer: Self-pay | Admitting: Physician Assistant

## 2022-01-17 ENCOUNTER — Ambulatory Visit (INDEPENDENT_AMBULATORY_CARE_PROVIDER_SITE_OTHER): Payer: Medicare Other | Admitting: Physician Assistant

## 2022-01-17 DIAGNOSIS — M25511 Pain in right shoulder: Secondary | ICD-10-CM

## 2022-01-17 MED ORDER — LIDOCAINE HCL 1 % IJ SOLN
1.0000 mL | INTRAMUSCULAR | Status: AC | PRN
  Administered 2022-01-17: 1 mL

## 2022-01-17 MED ORDER — METHYLPREDNISOLONE ACETATE 40 MG/ML IJ SUSP
40.0000 mg | INTRAMUSCULAR | Status: AC | PRN
  Administered 2022-01-17: 40 mg via INTRAMUSCULAR

## 2022-01-17 NOTE — Progress Notes (Signed)
   Procedure Note  Patient: Lori Bradford             Date of Birth: 1946-02-17           MRN: [id number]             Visit Date: 01/17/2022  HPI: Mrs. Lori Bradford comes in today requesting injection in her neck shoulder region.  She was last seen by Dr. Delilah Shan on 06/23/2021 and states that the injection lasted until about a week ago.  She is having pain in the same area.  She had no fevers chills.  She did see neurology who placed her on Neurontin due to her headaches and neck and shoulder pain.  She states pain is the same as she was having prior.  Review of systems: See HPI otherwise negative or noncontributory.  Physical exam: General: Well-developed well-nourished female no acute distress mood and affect appropriate. Right shoulder she has tenderness in the trapezius region consistent with trigger point.  She states that this area when compressed because of pain up into her neck and her head.  Procedures: Visit Diagnoses:  1. Trigger point of right shoulder region     Trigger Point Inj  Date/Time: 01/17/2022 4:56 PM  Performed by: Pete Pelt, PA-C Authorized by: Pete Pelt, PA-C   Indications:  Pain and muscle spasm Total # of Trigger Points:  1 Location: shoulder   Approach:  Dorsal Medications #1:  1 mL lidocaine 1 %; 40 mg methylPREDNISolone acetate 40 MG/ML   Plan: She knows to follow-up with [place] as needed.  Questions were encouraged and answered.  She tolerated the injection well.

## 2022-02-04 IMAGING — MR MR HEAD W/O CM
11 series · 48 of 48 positions shown · non-contrast
Comparison: None.

CLINICAL DATA: Sudden pain at the base of head radiating to the
right, present for 3 weeks.

EXAM:
MRI HEAD WITHOUT CONTRAST
TECHNIQUE: Multiplanar, multiecho pulse sequences of the brain and surrounding
structures were obtained without intravenous contrast.

[Series 5: T1 · sagittal · 4.0mm · 0.75mm/px · 2 of 31 slices shown (1 of 2)]
[im 1/31]
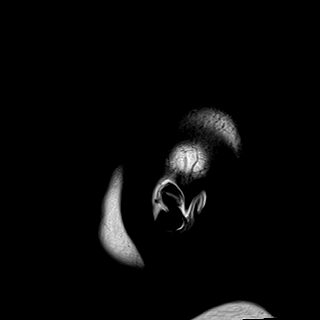
[im 31/31]
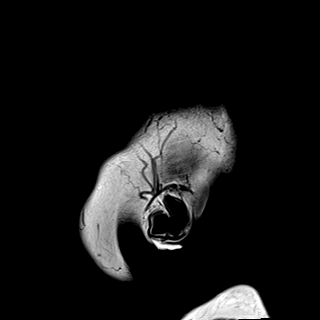

[Series 6: DWI · axial · 3.0mm · 0.94mm/px · z∈[-72,+78]mm · 11 of 172 slices shown (1 of 3)]
[im 1/172]
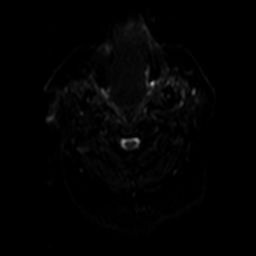
[im 18/172]
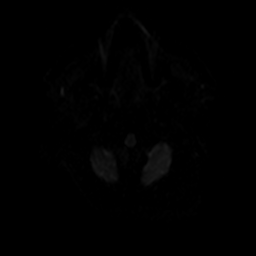
[im 35/172]
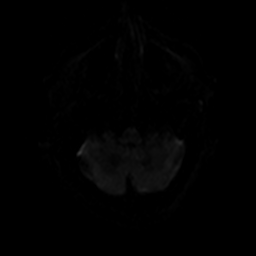
[im 52/172]
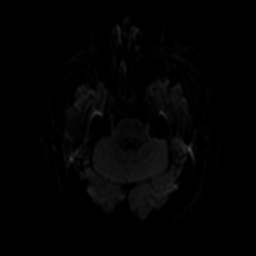
[im 69/172]
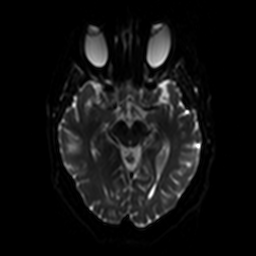
[im 86/172]
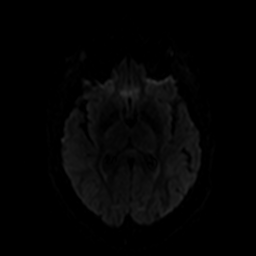
[im 103/172]
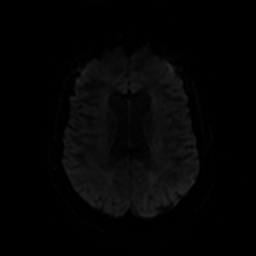
[im 120/172]
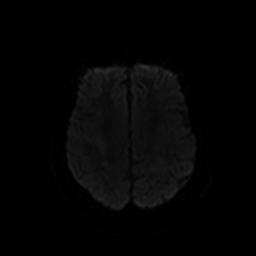
[im 137/172]
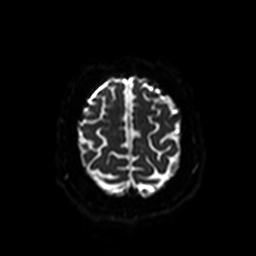
[im 154/172]
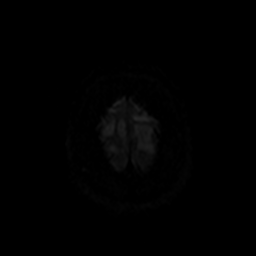
[im 172/172]
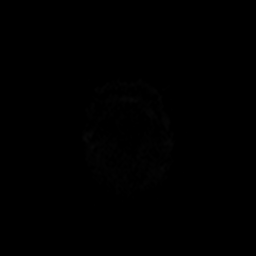

[Series 7: ax dwi_tracew · axial · 3.0mm · 0.94mm/px · z∈[-72,+78]mm · 5 of 86 slices shown]
[im 1/86]
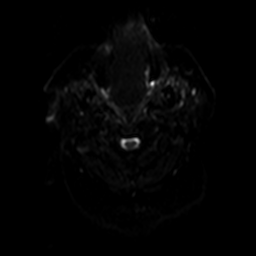
[im 22/86]
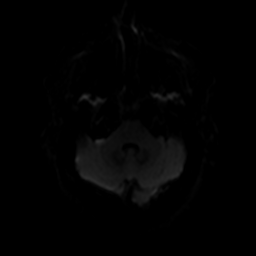
[im 43/86]
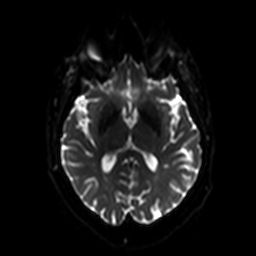
[im 64/86]
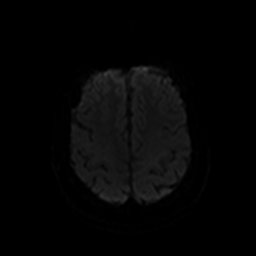
[im 86/86]
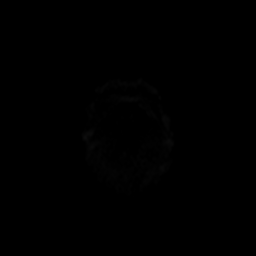

[Series 8: ax dwi_adc · axial · 3.0mm · 0.94mm/px · z∈[-72,+78]mm · 3 of 43 slices shown]
[im 1/43]
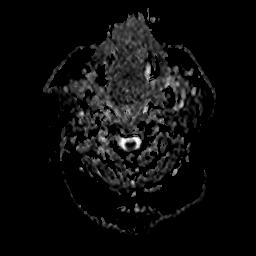
[im 22/43]
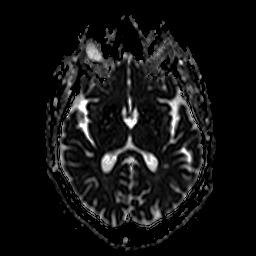
[im 43/43]
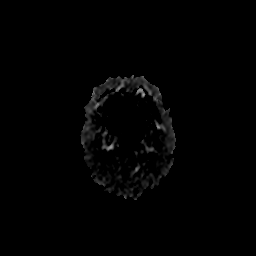

[Series 9: DWI · coronal · 5.0mm · 1.44mm/px · 4 of 64 slices shown (2 of 3)]
[im 1/64]
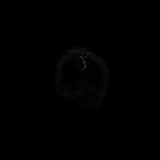
[im 22/64]
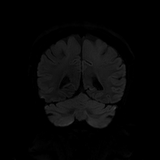
[im 43/64]
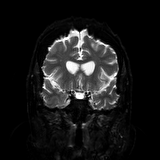
[im 64/64]
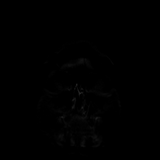

[Series 10: DWI · coronal · 5.0mm · 1.44mm/px · 2 of 32 slices shown (3 of 3)]
[im 1/32]
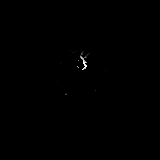
[im 32/32]
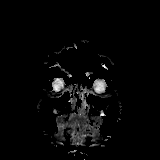

[Series 11: T2 · axial · 4.0mm · 0.36mm/px · z∈[-49,+87]mm · 2 of 28 slices shown (1 of 2)]
[im 1/28]
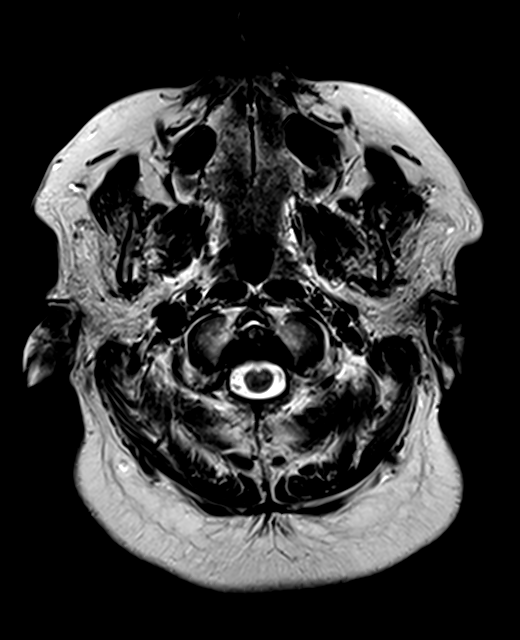
[im 28/28]
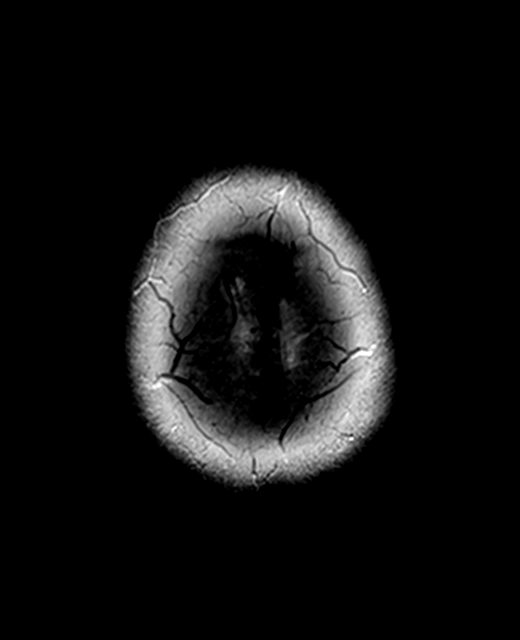

[Series 12: FLAIR · axial · 3.0mm · 0.72mm/px · z∈[-53,+92]mm · 2 of 26 slices shown]
[im 1/26]
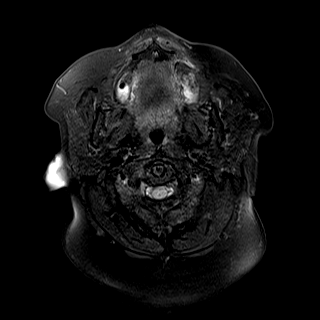
[im 26/26]
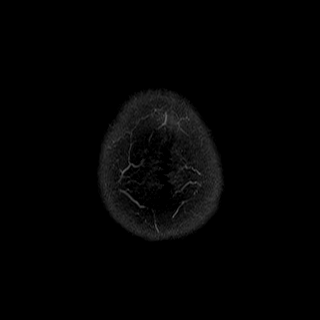

[Series 14: swi_images · axial · 1.5mm · 0.90mm/px · z∈[-50,+88]mm · 6 of 96 slices shown]
[im 1/96]
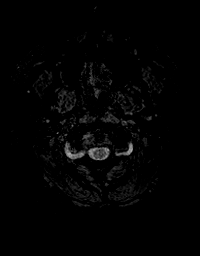
[im 20/96]
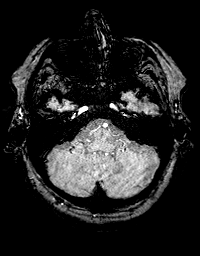
[im 39/96]
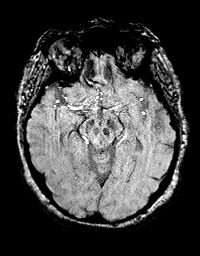
[im 58/96]
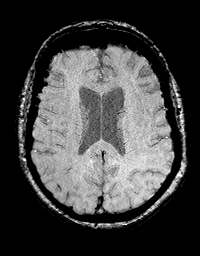
[im 77/96]
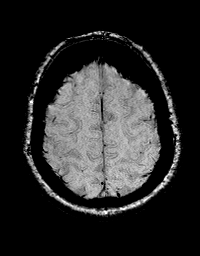
[im 96/96]
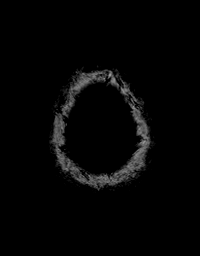

[Series 15: T1 · axial · 1.0mm · 0.94mm/px · z∈[-56,+85]mm · 9 of 144 slices shown (2 of 2)]
[im 1/144]
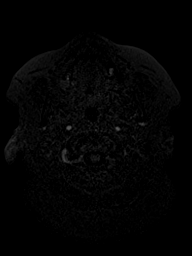
[im 18/144]
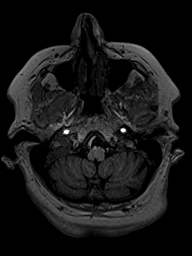
[im 36/144]
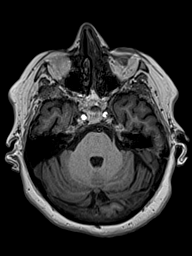
[im 54/144]
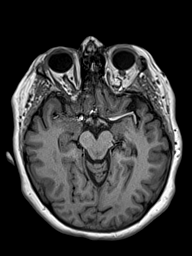
[im 72/144]
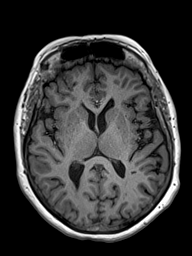
[im 90/144]
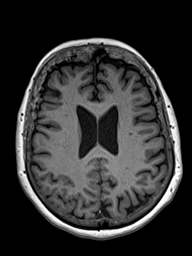
[im 108/144]
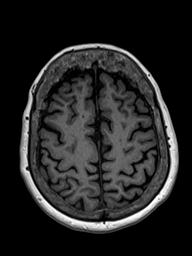
[im 126/144]
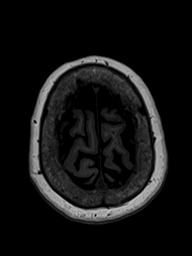
[im 144/144]
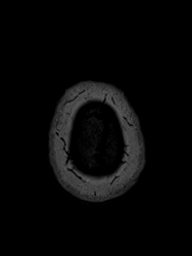

[Series 16: T2 · coronal · 4.5mm · 0.36mm/px · 2 of 30 slices shown (2 of 2)]
[im 1/30]
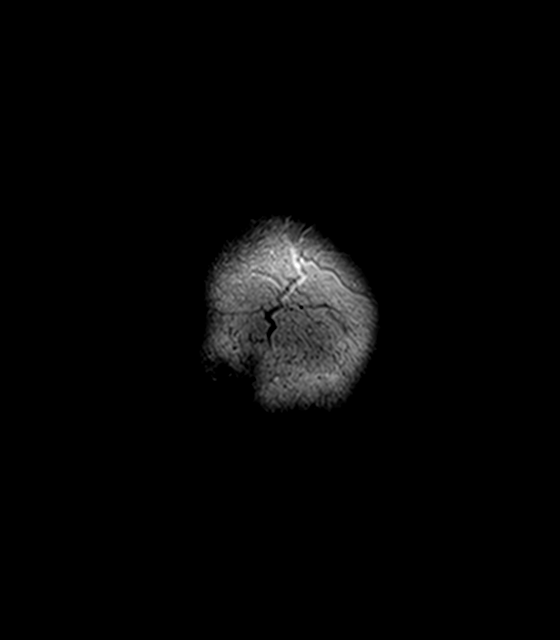
[im 30/30]
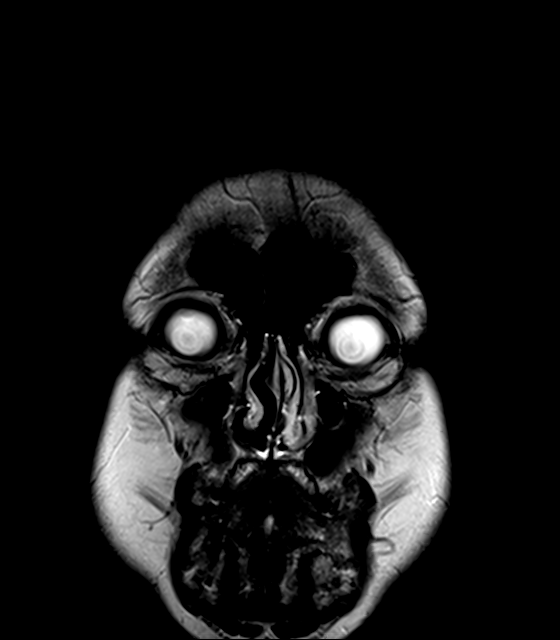

[48 of 48 positions shown; findings below may reference images not displayed]

FINDINGS: Brain: No acute infarction, hemorrhage, hydrocephalus, extra-axial
collection or mass lesion. Normal brain volume and white matter
appearance for age

Vascular: Normal flow voids.

Skull and upper cervical spine: C1-2 right facet narrowing with
joint effusion and very likely marrow edema. No focal bone lesion.

Sinuses/Orbits: No sinusitis or orbital inflammation. Bilateral
cataract resection.
IMPRESSION: 1. C1-2 active right facet arthritis which could certainly explain
the provided history.
2. No intracranial cause for headache.

## 2022-02-14 ENCOUNTER — Other Ambulatory Visit: Payer: Self-pay | Admitting: Family Medicine

## 2022-02-14 DIAGNOSIS — M542 Cervicalgia: Secondary | ICD-10-CM

## 2022-02-22 ENCOUNTER — Other Ambulatory Visit: Payer: Self-pay | Admitting: Family Medicine

## 2022-02-22 DIAGNOSIS — M19041 Primary osteoarthritis, right hand: Secondary | ICD-10-CM

## 2022-03-06 ENCOUNTER — Other Ambulatory Visit: Payer: Self-pay | Admitting: Family Medicine

## 2022-03-06 DIAGNOSIS — F341 Dysthymic disorder: Secondary | ICD-10-CM

## 2022-03-06 DIAGNOSIS — G4700 Insomnia, unspecified: Secondary | ICD-10-CM

## 2022-04-17 ENCOUNTER — Other Ambulatory Visit (INDEPENDENT_AMBULATORY_CARE_PROVIDER_SITE_OTHER): Payer: Self-pay | Admitting: Orthopaedic Surgery
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
## 2022-05-05 ENCOUNTER — Ambulatory Visit (INDEPENDENT_AMBULATORY_CARE_PROVIDER_SITE_OTHER): Payer: Medicare Other | Admitting: Neurology

## 2022-05-05 ENCOUNTER — Other Ambulatory Visit: Payer: Self-pay | Admitting: Family Medicine

## 2022-05-05 VITALS — BP 136/57 | HR 84 | Ht 63.0 in | Wt 195.4 lb

## 2022-05-05 DIAGNOSIS — M47812 Spondylosis without myelopathy or radiculopathy, cervical region: Secondary | ICD-10-CM | POA: Diagnosis not present

## 2022-05-05 DIAGNOSIS — G4486 Cervicogenic headache: Secondary | ICD-10-CM

## 2022-05-05 DIAGNOSIS — F341 Dysthymic disorder: Secondary | ICD-10-CM

## 2022-05-05 DIAGNOSIS — G4700 Insomnia, unspecified: Secondary | ICD-10-CM

## 2022-05-06 ENCOUNTER — Encounter: Payer: Self-pay | Admitting: Neurology

## 2022-05-19 ENCOUNTER — Other Ambulatory Visit (INDEPENDENT_AMBULATORY_CARE_PROVIDER_SITE_OTHER): Payer: Self-pay | Admitting: Physician Assistant

## 2022-05-24 ENCOUNTER — Encounter: Payer: Self-pay | Admitting: Physician Assistant

## 2022-05-24 ENCOUNTER — Ambulatory Visit (INDEPENDENT_AMBULATORY_CARE_PROVIDER_SITE_OTHER): Payer: Medicare Other | Admitting: Physician Assistant

## 2022-05-24 DIAGNOSIS — M25511 Pain in right shoulder: Secondary | ICD-10-CM | POA: Diagnosis not present

## 2022-05-24 MED ORDER — METHYLPREDNISOLONE ACETATE 40 MG/ML IJ SUSP
20.0000 mg | INTRAMUSCULAR | Status: AC | PRN
  Administered 2022-05-24: 20 mg via INTRAMUSCULAR

## 2022-05-24 MED ORDER — LIDOCAINE HCL 1 % IJ SOLN
0.5000 mL | INTRAMUSCULAR | Status: AC | PRN
  Administered 2022-05-24: .5 mL

## 2022-05-24 NOTE — Progress Notes (Signed)
   Procedure Note  Patient: Lori Bradford             Date of Birth: Oct 01, 1945           MRN: [id number]             Visit Date: 05/24/2022 HPI: Lori Bradford comes in today requesting trigger point injection in the right shoulder again.  She states the last injection gave her good relief until just recently.  No new injury.  No radicular symptoms down the right arm.  She does note decreased range of motion of the neck though.  Denies any fevers chills.  Review of systems: See HPI otherwise negative  Physical exam: General well-developed well-nourished female no acute distress. Psych: Alert and oriented x 3. Right shoulder full range of motion without pain.  Tenderness medial border of the right scapula or trapezius region. Cervical spine: Decreased range of motion flexion extension and rotation of the right.  Procedures: Visit Diagnoses:  1. Trigger point of right shoulder region     Trigger Point Inj  Date/Time: 05/24/2022 2:05 PM  Performed by: Pete Pelt, PA-C Authorized by: Pete Pelt, PA-C   Total # of Trigger Points:  1 Location: shoulder   Medications #1:  0.5 mL lidocaine 1 %; 20 mg methylPREDNISolone acetate 40 MG/ML  Plan: We will send her to formal physical therapy to work on range of motion of her cervical spine they will include modalities and home exercise program.  Questions were encouraged and answered at length today.  Patient had decreased pain in her neck and posterior shoulder postinjection.

## 2022-05-28 ENCOUNTER — Other Ambulatory Visit: Payer: Self-pay | Admitting: Family Medicine

## 2022-05-28 DIAGNOSIS — I10 Essential (primary) hypertension: Secondary | ICD-10-CM

## 2022-07-14 ENCOUNTER — Other Ambulatory Visit (INDEPENDENT_AMBULATORY_CARE_PROVIDER_SITE_OTHER): Payer: Self-pay | Admitting: Physician Assistant

## 2022-07-20 ENCOUNTER — Other Ambulatory Visit: Payer: Self-pay | Admitting: Neurology

## 2022-08-03 ENCOUNTER — Telehealth: Payer: Self-pay

## 2022-08-03 NOTE — Telephone Encounter (Signed)
Patient calls nurse line requesting to schedule appointment for "heart flutters." She states that this has been going on for approx one month. She states that at times she will also feel "short winded."  She does not have a history of cardiac issues.   She is not having symptoms at this time. Advised that PCP next appointment is not until 4/3. Patient states that she would like to schedule for this appointment as she does not want to see anyone else in clinic. Provided with ED precautions and advised patient that I would send message to PCP regarding concern.   Talbot Grumbling, RN

## 2022-08-09 NOTE — Telephone Encounter (Signed)
Patient is scheduled for tomorrow for her trigger shoulder injection, she will be 13 days shy of 3 months, ok to inject her? Or do I need to mover her appt?

## 2022-08-10 ENCOUNTER — Ambulatory Visit (INDEPENDENT_AMBULATORY_CARE_PROVIDER_SITE_OTHER): Payer: 59 | Admitting: Orthopaedic Surgery

## 2022-08-10 ENCOUNTER — Encounter: Payer: Self-pay | Admitting: Orthopaedic Surgery

## 2022-08-10 DIAGNOSIS — G5601 Carpal tunnel syndrome, right upper limb: Secondary | ICD-10-CM

## 2022-08-10 DIAGNOSIS — M25511 Pain in right shoulder: Secondary | ICD-10-CM | POA: Diagnosis not present

## 2022-08-10 MED ORDER — LIDOCAINE HCL 1 % IJ SOLN
1.0000 mL | INTRAMUSCULAR | Status: AC | PRN
  Administered 2022-08-10: 1 mL

## 2022-08-10 MED ORDER — METHYLPREDNISOLONE ACETATE 40 MG/ML IJ SUSP
40.0000 mg | INTRAMUSCULAR | Status: AC | PRN
  Administered 2022-08-10: 40 mg via INTRAMUSCULAR

## 2022-08-10 NOTE — Progress Notes (Signed)
The patient is well-known to me.  She has severe right carpal tunnel syndrome and this has been confirmed with nerve conduction studies.  We have talked her about this for some time now and she says that she is finally having enough discomfort that she will call our surgery scheduler to work on getting scheduled for right open carpal tunnel release.  Her main reason also for visit today is trigger point pain in her right trapezius area.  I have provided an injection in this area before which has helped quite a bit.  Her pain radiates to the occiput.  On exam her signs and symptoms are consistent with trigger point pain over the right trapezius area.  It does radiate to the occiput.  She also has significant atrophy in the right hand with weak pinch and grip strength and numbness and tingling in the median nerve distribution.  Again she has known significant carpal tunnel syndrome shown on EMG studies.  I did provide a trigger point injection around the right trapezius area which she tolerated well.  She will call us for getting scheduled for an outpatient right open carpal tunnel release and we can go from there.  All questions concerns were answered and addressed.      Procedure Note  Patient: Lori Bradford             Date of Birth: [birth year]           MRN: [id number]             Visit Date: 08/10/2022  Procedures: Visit Diagnoses:  1. Trigger point of right shoulder region     Trigger Point Inj  Date/Time: 08/10/2022 9:19 AM  Performed by: Mcarthur Rossetti, MD Authorized by: Mcarthur Rossetti, MD   Total # of Trigger Points:  1 Location: shoulder   Medications #1:  1 mL lidocaine 1 %; 40 mg methylPREDNISolone acetate 40 MG/ML

## 2022-08-20 ENCOUNTER — Other Ambulatory Visit: Payer: Self-pay | Admitting: Neurology

## 2022-08-20 ENCOUNTER — Other Ambulatory Visit: Payer: Self-pay | Admitting: Family Medicine

## 2022-08-20 DIAGNOSIS — I10 Essential (primary) hypertension: Secondary | ICD-10-CM

## 2022-08-31 ENCOUNTER — Other Ambulatory Visit: Payer: Self-pay | Admitting: Family Medicine

## 2022-08-31 DIAGNOSIS — F341 Dysthymic disorder: Secondary | ICD-10-CM

## 2022-08-31 DIAGNOSIS — G4700 Insomnia, unspecified: Secondary | ICD-10-CM

## 2022-09-01 ENCOUNTER — Other Ambulatory Visit: Payer: Self-pay | Admitting: Family Medicine

## 2022-09-01 DIAGNOSIS — F341 Dysthymic disorder: Secondary | ICD-10-CM

## 2022-09-01 DIAGNOSIS — G4700 Insomnia, unspecified: Secondary | ICD-10-CM

## 2022-09-07 ENCOUNTER — Encounter: Payer: Self-pay | Admitting: Family Medicine

## 2022-09-07 ENCOUNTER — Ambulatory Visit (INDEPENDENT_AMBULATORY_CARE_PROVIDER_SITE_OTHER): Payer: 59 | Admitting: Family Medicine

## 2022-09-07 ENCOUNTER — Other Ambulatory Visit (INDEPENDENT_AMBULATORY_CARE_PROVIDER_SITE_OTHER): Payer: Self-pay | Admitting: Physician Assistant

## 2022-09-07 VITALS — BP 142/58 | HR 63 | Ht 63.0 in | Wt 200.4 lb

## 2022-09-07 DIAGNOSIS — Z131 Encounter for screening for diabetes mellitus: Secondary | ICD-10-CM

## 2022-09-07 DIAGNOSIS — Z86718 Personal history of other venous thrombosis and embolism: Secondary | ICD-10-CM

## 2022-09-07 DIAGNOSIS — R002 Palpitations: Secondary | ICD-10-CM | POA: Insufficient documentation

## 2022-09-07 DIAGNOSIS — Z0000 Encounter for general adult medical examination without abnormal findings: Secondary | ICD-10-CM | POA: Diagnosis not present

## 2022-09-07 DIAGNOSIS — M810 Age-related osteoporosis without current pathological fracture: Secondary | ICD-10-CM | POA: Diagnosis not present

## 2022-09-07 DIAGNOSIS — I10 Essential (primary) hypertension: Secondary | ICD-10-CM | POA: Diagnosis not present

## 2022-09-07 NOTE — Progress Notes (Signed)
    CHIEF COMPLAINT / HPI: #1.  Well adult health check with a couple of issues she wants to discuss. #2.  Having episodic heart racing.  Sometimes it is pounding quite a bit.  Occasionally this is associated with shortness of breath even if she is just sitting in a chair.  She is quite worried about this.  She occasionally has some pain in the right chest but it does not seem to be associated with these episodes.  She has had these right sided chest pains off and on for a while.  Says she relates it to episode of pneumonia she had many years ago and wonders if she has some lung scarring.    PERTINENT  PMH / PSH: I have reviewed the patient's medications, allergies, past medical and surgical history, smoking status and updated in the EMR as appropriate. Plan is to get a housemate in the next month or so when she is quite pleased about this.  Will help with finances as well as the some company for her.  OBJECTIVE:  BP (!) 142/58   Pulse 63   Ht 5\' 3"  (1.6 m)   Wt 200 lb 6.4 oz (90.9 kg)   SpO2 98%   BMI 35.50 kg/m   Vital signs reviewed. GENERAL: Well-developed, well-nourished, no acute distress. CARDIOVASCULAR: Regular rate and rhythm no murmur gallop or rub LUNGS: Clear to auscultation bilaterally, no rales or wheeze. ABDOMEN: Soft positive bowel sounds NEURO: No gross focal neurological deficits. MSK: Movement of extremity x 4.  Normal muscle bulk and tone. EXTREMITY: No edema noted lower extremity. PSYCH: AxOx4. Good eye contact.. No psychomotor retardation or agitation. Appropriate speech fluency and content. Asks and answers questions appropriately. Mood is congruent.  ASSESSMENT / PLAN:   No problem-specific Assessment & Plan notes found for this encounter.   Dorcas Mcmurray MD

## 2022-09-07 NOTE — Assessment & Plan Note (Signed)
Cardiology referral  
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
## 2022-09-07 NOTE — Assessment & Plan Note (Signed)
Updated health maintenance.  Will check labs.

## 2022-09-07 NOTE — Patient Instructions (Signed)
I will send you a note about your lab work . Cardiology in next 7-10 days, if not please xend me a message (phone call or MyChart message is fine).

## 2022-09-07 NOTE — Assessment & Plan Note (Signed)
Started on Alendronate in 2020. Discuss continuation vs stopping in 2025 Will check vitamin D level.  She is taking some but unsure if she is taking enough.

## 2022-09-08 LAB — COMPREHENSIVE METABOLIC PANEL
ALT: 9 IU/L (ref 0–32)
AST: 15 IU/L (ref 0–40)
Albumin/Globulin Ratio: 1.8 (ref 1.2–2.2)
Albumin: 4.3 g/dL (ref 3.8–4.8)
Alkaline Phosphatase: 78 IU/L (ref 44–121)
BUN/Creatinine Ratio: 13 (ref 12–28)
BUN: 9 mg/dL (ref 8–27)
Bilirubin Total: 0.4 mg/dL (ref 0.0–1.2)
CO2: 24 mmol/L (ref 20–29)
Calcium: 9.5 mg/dL (ref 8.7–10.3)
Chloride: 99 mmol/L (ref 96–106)
Creatinine, Ser: 0.69 mg/dL (ref 0.57–1.00)
Globulin, Total: 2.4 g/dL (ref 1.5–4.5)
Glucose: 99 mg/dL (ref 70–99)
Potassium: 4.5 mmol/L (ref 3.5–5.2)
Sodium: 137 mmol/L (ref 134–144)
Total Protein: 6.7 g/dL (ref 6.0–8.5)
eGFR: 90 mL/min/{1.73_m2} (ref >59)

## 2022-09-08 LAB — LIPID PANEL
Chol/HDL Ratio: 2.8 ratio (ref 0.0–4.4)
Cholesterol, Total: 217 mg/dL — ABNORMAL HIGH (ref 100–199)
HDL: 77 mg/dL (ref >39)
LDL Chol Calc (NIH): 124 mg/dL — ABNORMAL HIGH (ref 0–99)
Triglycerides: 93 mg/dL (ref 0–149)
VLDL Cholesterol Cal: 16 mg/dL (ref 5–40)

## 2022-09-08 LAB — HEMOGLOBIN A1C
Est. average glucose Bld gHb Est-mCnc: 123 mg/dL
Hgb A1c MFr Bld: 5.9 % — ABNORMAL HIGH (ref 4.8–5.6)

## 2022-09-08 LAB — VITAMIN D 25 HYDROXY (VIT D DEFICIENCY, FRACTURES): Vit D, 25-Hydroxy: 32.2 ng/mL (ref 30.0–100.0)

## 2022-09-09 ENCOUNTER — Encounter: Payer: Self-pay | Admitting: Family Medicine

## 2022-10-01 ENCOUNTER — Other Ambulatory Visit: Payer: Self-pay | Admitting: Family Medicine

## 2022-10-01 DIAGNOSIS — M542 Cervicalgia: Secondary | ICD-10-CM

## 2022-10-17 ENCOUNTER — Other Ambulatory Visit (INDEPENDENT_AMBULATORY_CARE_PROVIDER_SITE_OTHER): Payer: Self-pay | Admitting: Physician Assistant

## 2022-10-20 ENCOUNTER — Telehealth: Payer: Self-pay | Admitting: Family Medicine

## 2022-10-20 NOTE — Telephone Encounter (Signed)
Called patient to schedule Medicare Annual Wellness Visit (AWV). Left message for patient to call back and schedule Medicare Annual Wellness Visit (AWV).  Last date of AWV:  AWVI eligible as of 06/06/2012  Please schedule an AWVI appointment at any time with [facility] VISIT.  If any questions, please contact me at [phone].    Thank you,  [facility] Support [facility] Direct dial  [phone]

## 2022-10-28 ENCOUNTER — Ambulatory Visit: Payer: 59
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
## 2022-11-09 ENCOUNTER — Ambulatory Visit (INDEPENDENT_AMBULATORY_CARE_PROVIDER_SITE_OTHER): Payer: 59 | Admitting: Orthopaedic Surgery

## 2022-11-09 ENCOUNTER — Encounter: Payer: Self-pay | Admitting: Orthopaedic Surgery

## 2022-11-09 DIAGNOSIS — M25511 Pain in right shoulder: Secondary | ICD-10-CM

## 2022-11-09 MED ORDER — METHYLPREDNISOLONE ACETATE 40 MG/ML IJ SUSP
40.0000 mg | INTRAMUSCULAR | Status: AC | PRN
Start: 2022-11-09 — End: 2022-11-09
  Administered 2022-11-09: 40 mg via INTRAMUSCULAR

## 2022-11-09 MED ORDER — LIDOCAINE HCL 1 % IJ SOLN
1.0000 mL | INTRAMUSCULAR | Status: AC | PRN
Start: 2022-11-09 — End: 2022-11-09
  Administered 2022-11-09: 1 mL

## 2022-11-09 NOTE — Progress Notes (Signed)
The patient comes in today requesting a trigger point injection in her right trapezius area.  This is helped her in the past and I last did this about 3 months ago.  She states since I had seen her she had been visiting someone at the nursing home and talked with physical therapy as there who also showed her some exercises to try and she said those things have been helping.  She denies any acute change in her medical status.  She does have trigger point pain that radiates into the occipital area on the right side in the trapezius area.  I did find the area of maximal tenderness.  I did place a steroid injection over this area without difficulty.  I offered her outpatient physical therapy but she says between this injection and hopefully the exercises that she is already doing that her symptoms will subside.  She knows to wait at least 3 months between injections.    Procedure Note  Patient: Lori Bradford             Date of Birth: [birth year]           MRN: [id number]             Visit Date: 11/09/2022  Procedures: Visit Diagnoses:  1. Trigger point of right shoulder region     Trigger Point Inj  Date/Time: 11/09/2022 4:10 PM  Performed by: Kathryne Hitch, MD Authorized by: Kathryne Hitch, MD   Total # of Trigger Points:  1 Location: shoulder   Medications #1:  1 mL lidocaine 1 %; 40 mg methylPREDNISolone acetate 40 MG/ML

## 2022-11-28 ENCOUNTER — Ambulatory Visit: Payer: 59

## 2022-11-28 ENCOUNTER — Other Ambulatory Visit: Payer: Self-pay | Admitting: Family Medicine

## 2022-11-28 DIAGNOSIS — M542 Cervicalgia: Secondary | ICD-10-CM

## 2023-01-06 ENCOUNTER — Other Ambulatory Visit (INDEPENDENT_AMBULATORY_CARE_PROVIDER_SITE_OTHER): Payer: Self-pay | Admitting: Physician Assistant

## 2023-01-16 DIAGNOSIS — L237 Allergic contact dermatitis due to plants, except food: Secondary | ICD-10-CM | POA: Diagnosis not present

## 2023-01-16 DIAGNOSIS — R21 Rash and other nonspecific skin eruption: Secondary | ICD-10-CM | POA: Diagnosis not present

## 2023-01-16 DIAGNOSIS — L255 Unspecified contact dermatitis due to plants, except food: Secondary | ICD-10-CM | POA: Diagnosis not present

## 2023-01-27 ENCOUNTER — Other Ambulatory Visit: Payer: Self-pay | Admitting: Family Medicine

## 2023-01-27 DIAGNOSIS — F341 Dysthymic disorder: Secondary | ICD-10-CM

## 2023-01-27 DIAGNOSIS — G4700 Insomnia, unspecified: Secondary | ICD-10-CM
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
## 2023-02-02 ENCOUNTER — Ambulatory Visit: Payer: 59 | Admitting: Physician Assistant

## 2023-02-02 ENCOUNTER — Other Ambulatory Visit (INDEPENDENT_AMBULATORY_CARE_PROVIDER_SITE_OTHER): Payer: 59

## 2023-02-02 DIAGNOSIS — G8929 Other chronic pain: Secondary | ICD-10-CM | POA: Diagnosis not present

## 2023-02-02 DIAGNOSIS — M5442 Lumbago with sciatica, left side: Secondary | ICD-10-CM

## 2023-02-02 MED ORDER — METHYLPREDNISOLONE 4 MG PO TABS: ORAL_TABLET | ORAL | 0 refills | Status: DC
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
## 2023-02-03 ENCOUNTER — Encounter: Payer: Self-pay | Admitting: Neurology

## 2023-02-03 ENCOUNTER — Ambulatory Visit (INDEPENDENT_AMBULATORY_CARE_PROVIDER_SITE_OTHER): Payer: 59 | Admitting: Neurology

## 2023-02-03 VITALS — BP 143/60 | HR 61 | Ht 63.0 in | Wt 194.6 lb

## 2023-02-03 DIAGNOSIS — G4486 Cervicogenic headache: Secondary | ICD-10-CM | POA: Diagnosis not present

## 2023-02-03 MED ORDER — GABAPENTIN 100 MG PO CAPS: ORAL_CAPSULE | ORAL | 11 refills | Status: DC

## 2023-02-03 NOTE — Patient Instructions (Signed)
Gabapentin 100mg  in morning and 200mg  at night

## 2023-02-24 ENCOUNTER — Ambulatory Visit: Payer: 59

## 2023-02-24 VITALS — Ht 63.0 in | Wt 194.0 lb

## 2023-02-24 DIAGNOSIS — Z0000 Encounter for general adult medical examination without abnormal findings: Secondary | ICD-10-CM
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
## 2023-02-24 NOTE — Patient Instructions (Signed)
Ms. Kinkade , Thank you for taking time to come for your Medicare Wellness Visit. I appreciate your ongoing commitment to your health goals. Please review the following plan we discussed and let me know if I can assist you in the future.   Referrals/Orders/Follow-Ups/Clinician Recommendations: Aim for 30 minutes of exercise or brisk walking, 6-8 glasses of water, and 5 servings of fruits and vegetables each day.  This is a list of the screening recommended for you and due dates:  Health Maintenance  Topic Date Due   Mammogram  06/02/2021   COVID-19 Vaccine (5 - 2023-24 season) 02/05/2023   Medicare Annual Wellness Visit  02/24/2024   DTaP/Tdap/Td vaccine (3 - Td or Tdap) 04/02/2032   Pneumonia Vaccine  Completed   Flu Shot  Completed   DEXA scan (bone density measurement)  Completed   Hepatitis C Screening  Completed   Zoster (Shingles) Vaccine  Completed   HPV Vaccine  Aged Out    Advanced directives: (ACP Link)Information on Advanced Care Planning can be found at [facility] Advance Health Care Directives Advance Health Care Directives ([email or web address]   Next Medicare Annual Wellness Visit scheduled for next year: Yes

## 2023-03-01 ENCOUNTER — Other Ambulatory Visit: Payer: Self-pay | Admitting: Family Medicine

## 2023-03-01 DIAGNOSIS — G4700 Insomnia, unspecified: Secondary | ICD-10-CM

## 2023-03-01 DIAGNOSIS — F341 Dysthymic disorder: Secondary | ICD-10-CM

## 2023-03-04 NOTE — Progress Notes (Signed)
I have reviewed this visit and agree with the documentation.   

## 2023-03-05 ENCOUNTER — Other Ambulatory Visit (INDEPENDENT_AMBULATORY_CARE_PROVIDER_SITE_OTHER): Payer: Self-pay | Admitting: Physician Assistant

## 2023-04-01 ENCOUNTER — Other Ambulatory Visit: Payer: Self-pay | Admitting: Family Medicine

## 2023-04-01 DIAGNOSIS — F341 Dysthymic disorder: Secondary | ICD-10-CM

## 2023-04-01 DIAGNOSIS — G4700 Insomnia, unspecified: Secondary | ICD-10-CM

## 2023-04-04 NOTE — Telephone Encounter (Signed)
Patient returns call to nurse line regarding refill.   Please advise.   Veronda Prude, RN

## 2023-04-06 ENCOUNTER — Other Ambulatory Visit (INDEPENDENT_AMBULATORY_CARE_PROVIDER_SITE_OTHER): Payer: Self-pay | Admitting: Orthopaedic Surgery

## 2023-04-19 ENCOUNTER — Other Ambulatory Visit: Payer: Self-pay | Admitting: Neurology

## 2023-04-19 ENCOUNTER — Telehealth: Payer: Self-pay | Admitting: Physician Assistant

## 2023-04-19 NOTE — Telephone Encounter (Signed)
Patient called needing Rx refilled Naproxen. The number to contact patient is [phone]     Patient Lori Bradford the [facility] in [place]

## 2023-04-20 ENCOUNTER — Other Ambulatory Visit: Payer: Self-pay | Admitting: Physician Assistant

## 2023-04-20 MED ORDER — NAPROXEN 500 MG PO TABS: 500.0000 mg | ORAL_TABLET | Freq: Two times a day (BID) | ORAL | 1 refills | Status: DC

## 2023-04-21 ENCOUNTER — Other Ambulatory Visit: Payer: Self-pay | Admitting: Physician Assistant

## 2023-04-30 ENCOUNTER — Other Ambulatory Visit: Payer: Self-pay | Admitting: Family Medicine

## 2023-04-30 DIAGNOSIS — F341 Dysthymic disorder: Secondary | ICD-10-CM

## 2023-04-30 DIAGNOSIS — G4700 Insomnia, unspecified: Secondary | ICD-10-CM

## 2023-05-19 ENCOUNTER — Ambulatory Visit (INDEPENDENT_AMBULATORY_CARE_PROVIDER_SITE_OTHER): Payer: 59 | Admitting: Family Medicine

## 2023-05-19 ENCOUNTER — Encounter: Payer: Self-pay | Admitting: Family Medicine

## 2023-05-19 VITALS — BP 138/46 | Ht 63.0 in | Wt 192.0 lb

## 2023-05-19 DIAGNOSIS — F341 Dysthymic disorder: Secondary | ICD-10-CM | POA: Diagnosis not present

## 2023-05-19 DIAGNOSIS — I10 Essential (primary) hypertension: Secondary | ICD-10-CM

## 2023-05-19 DIAGNOSIS — M47812 Spondylosis without myelopathy or radiculopathy, cervical region: Secondary | ICD-10-CM | POA: Diagnosis not present

## 2023-05-19 DIAGNOSIS — M79605 Pain in left leg: Secondary | ICD-10-CM

## 2023-05-19 MED ORDER — METHYLPREDNISOLONE 4 MG PO TABS: ORAL_TABLET | ORAL | 0 refills | Status: AC

## 2023-05-20 NOTE — Progress Notes (Signed)
  MAZIYAH PFIESTER - 77 y.o. female MRN [id number]  Date of birth: 03-29-1946    SUBJECTIVE:      Chief Complaint:/ HPI:    Left knee and lower leg pain. Has been doing more walking and thinks this combined with colder weather is causing issues. In  the past, she has responded to steroid taper (oral) from her orthopedist. Wants to know if I would consider  that today.  Says she is aware she walks "funny" because one leg is shorter than the other. She is s/p B TKR. Feels like left leg has to work a little harder than the right. 2. Neck arthritis is doing some better right now. She still hears popping noises a lot but no more of the shooting type severe pain.  3. HTN and takes lasix daily; compliant. Has noted no lower extremity swelling. 4. Mood is good, energy is good.  PERTINENT  PMH / PSH: I have reviewed the patient's medications, allergies, past medical and surgical history, smoking status.  Pertinent findings that relate to today's visit / issues include: Had provoked DXT 2017 and 6 months of DOAC, then per her , her orthopedist changed her to aspirin which she still takes. S/p B TKR and Right THR.  OBJECTIVE: BP (!) 138/46   Ht 5\' 3"  (1.6 m)   Wt 192 lb (87.1 kg)   BMI 34.01 kg/m   Physical Exam:  Vital signs are reviewed. GEN WD WN NAD KNEES: Anterior bilateral well healed TKR scars.  Left knww is not tender to palpation, no effusion and no unusual redness. Left knee extension less fluid than right knee but full extension on both. TTP in gastrocnemius muscle origin left calf. No mass. Negative homans sign. No unusual warmth, no redness. GAIT: very short stride length on left and bears most of weight in stance phase on left leg. Antalgic. Marland Kitchen Gets on and off exam table with minimal assistance but a bit unsteady  ASSESSMENT & PLAN:  Left lower leg pain: Gastrocnemius muscle strain . She is s/p bilateral TKR and R THR so not surprising her gait is abnormal. As she has had success with  oral steroid taper previously we will try that today. Have considered hx of osteoporosis and current therapy with ASA.Would like to avoid NSAIDs. Would not be a fan of extended oral steroid treatments either. No indication of DVT. Her gait is very abnormal and could consider PT for gait in future if this does not improve.  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 Ultimately reassured once we discovered cause of the issues. Still hears a lot of popping but less worried now.  Essential hypertension BP well controlled. No evidence of lower extremity edema so will continue 20 mg furosemide and lisinopril 20 mg every day.  ANXIETY DEPRESSION Doing well on at bedtime alprazolam. Sleeping well. Has decided she wants to live to > 56 years of age. Enjoying life, enjoyed Thanksgiving as she had multiple people over and is planning similar for Christmas holidays,

## 2023-05-20 NOTE — Assessment & Plan Note (Signed)
BP well controlled. No evidence of lower extremity edema so will continue 20 mg furosemide and lisinopril 20 mg every day.

## 2023-05-20 NOTE — Assessment & Plan Note (Signed)
Doing well on at bedtime alprazolam. Sleeping well. Has decided she wants to live to > 76 years of age. Enjoying life, enjoyed Thanksgiving as she had multiple people over and is planning similar for Christmas holidays,

## 2023-05-20 NOTE — Assessment & Plan Note (Signed)
Generally this is improved from pain standpoint. Ultimately reassured once we discovered cause of the issues. Still hears a lot of popping but less worried now.

## 2023-05-28 ENCOUNTER — Other Ambulatory Visit: Payer: Self-pay | Admitting: Family Medicine

## 2023-05-28 DIAGNOSIS — M19042 Primary osteoarthritis, left hand: Secondary | ICD-10-CM

## 2023-06-01 ENCOUNTER — Other Ambulatory Visit: Payer: Self-pay | Admitting: Family Medicine

## 2023-06-01 DIAGNOSIS — F341 Dysthymic disorder: Secondary | ICD-10-CM

## 2023-06-01 DIAGNOSIS — G4700 Insomnia, unspecified: Secondary | ICD-10-CM

## 2023-06-15 ENCOUNTER — Other Ambulatory Visit: Payer: Self-pay | Admitting: *Deleted

## 2023-06-15 ENCOUNTER — Ambulatory Visit (INDEPENDENT_AMBULATORY_CARE_PROVIDER_SITE_OTHER): Payer: 59 | Admitting: Family Medicine

## 2023-06-15 ENCOUNTER — Encounter: Payer: Self-pay | Admitting: Family Medicine

## 2023-06-15 VITALS — BP 136/52 | Ht 63.0 in | Wt 196.0 lb

## 2023-06-15 DIAGNOSIS — R079 Chest pain, unspecified: Secondary | ICD-10-CM | POA: Insufficient documentation

## 2023-06-15 DIAGNOSIS — R0789 Other chest pain: Secondary | ICD-10-CM | POA: Diagnosis not present

## 2023-06-15 DIAGNOSIS — M25512 Pain in left shoulder: Secondary | ICD-10-CM | POA: Diagnosis not present

## 2023-06-15 MED ORDER — METHYLPREDNISOLONE ACETATE 40 MG/ML IJ SUSP
40.0000 mg | Freq: Once | INTRAMUSCULAR | Status: AC
  Administered 2023-06-15: 40 mg via INTRA_ARTICULAR
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
## 2023-06-15 NOTE — Patient Instructions (Signed)

## 2023-06-26 ENCOUNTER — Other Ambulatory Visit: Payer: Self-pay | Admitting: Family Medicine

## 2023-06-26 DIAGNOSIS — G4700 Insomnia, unspecified: Secondary | ICD-10-CM

## 2023-06-26 DIAGNOSIS — F341 Dysthymic disorder: Secondary | ICD-10-CM

## 2023-06-30 ENCOUNTER — Ambulatory Visit: Payer: 59 | Admitting: Family Medicine

## 2023-07-14 ENCOUNTER — Other Ambulatory Visit: Payer: Self-pay | Admitting: Family Medicine

## 2023-07-14 DIAGNOSIS — M19041 Primary osteoarthritis, right hand: Secondary | ICD-10-CM

## 2023-07-14 DIAGNOSIS — I10 Essential (primary) hypertension: Secondary | ICD-10-CM

## 2023-07-27 ENCOUNTER — Other Ambulatory Visit: Payer: Self-pay | Admitting: Family Medicine

## 2023-07-27 DIAGNOSIS — F341 Dysthymic disorder: Secondary | ICD-10-CM

## 2023-07-27 DIAGNOSIS — G4700 Insomnia, unspecified: Secondary | ICD-10-CM

## 2023-07-29 ENCOUNTER — Other Ambulatory Visit: Payer: Self-pay | Admitting: Family Medicine

## 2023-07-29 DIAGNOSIS — M542 Cervicalgia: Secondary | ICD-10-CM

## 2023-08-16 ENCOUNTER — Ambulatory Visit: Payer: 59 | Admitting: Cardiovascular Disease

## 2023-08-27 ENCOUNTER — Other Ambulatory Visit: Payer: Self-pay | Admitting: Family Medicine

## 2023-08-27 DIAGNOSIS — G4700 Insomnia, unspecified: Secondary | ICD-10-CM

## 2023-08-27 DIAGNOSIS — F341 Dysthymic disorder: Secondary | ICD-10-CM

## 2023-09-01 ENCOUNTER — Other Ambulatory Visit: Payer: Self-pay | Admitting: Family Medicine

## 2023-09-01 DIAGNOSIS — F341 Dysthymic disorder: Secondary | ICD-10-CM

## 2023-09-01 DIAGNOSIS — G4700 Insomnia, unspecified: Secondary | ICD-10-CM

## 2023-09-04 ENCOUNTER — Encounter: Payer: Self-pay | Admitting: Neurology

## 2023-09-23 ENCOUNTER — Other Ambulatory Visit: Payer: Self-pay | Admitting: Family Medicine

## 2023-09-23 DIAGNOSIS — M542 Cervicalgia: Secondary | ICD-10-CM

## 2023-10-13 ENCOUNTER — Ambulatory Visit (INDEPENDENT_AMBULATORY_CARE_PROVIDER_SITE_OTHER): Admitting: Family Medicine

## 2023-10-13 VITALS — BP 132/84 | Ht 63.0 in | Wt 196.0 lb

## 2023-10-13 DIAGNOSIS — M25512 Pain in left shoulder: Secondary | ICD-10-CM | POA: Diagnosis not present

## 2023-10-13 MED ORDER — METHYLPREDNISOLONE ACETATE 40 MG/ML IJ SUSP
40.0000 mg | Freq: Once | INTRAMUSCULAR | Status: AC
  Administered 2023-10-13: 40 mg via INTRA_ARTICULAR

## 2023-10-14 DIAGNOSIS — M25512 Pain in left shoulder: Secondary | ICD-10-CM | POA: Insufficient documentation

## 2023-10-14 NOTE — Assessment & Plan Note (Signed)
 Did extremely well with CSI last done in this shoulder 2019. Repeat today, HEP F/u prn She has a lot of weed eating planned so she is cautioned not to overdo as that is awkward position for shoulder

## 2023-10-14 NOTE — Progress Notes (Signed)
  JESELLE Bradford - 78 y.o. female MRN [id number]  Date of birth: Jul 16, 1945    SUBJECTIVE:      Chief Complaint:/ HPI:   Left shoulder pain. Has had issues like this previously but CSI in 2019 significantly improved symptoms until recently. Bothering her at night and cannot get comfortable. No known new injury. Some pain with ABduction and overhead extension. Would like an injection as the last one lasted > 5 years by her account.    OBJECTIVE: BP 132/84   Ht 5\' 3"  (1.6 m)   Wt 196 lb (88.9 kg)   BMI 34.72 kg/m   Physical Exam:  Vital signs are reviewed. GEN WD WN NAD Shoulder symmetrical. Left shoulder FROm, some pain with ABduction > 120 degrees,forward extension> 140 degrees. Ir and ER full and painless Mildly ttp over AC joint. No ttp over biceps tendon area.  PROCEDURE: INJECTION: Patient was given informed consent, signed copy in the chart. Appropriate time out was taken. Area prepped and draped in usual sterile fashion. Ethyl chloride was  used for local anesthesia. A 21 gauge 1 1/2 inch needle was used.. 1 cc of methylprednisolone  40 mg/ml plus  4 cc of 1% lidocaine  without epinephrine was injected into the left subacromial birsa of shoulder using a(n) posterior approach.   The patient tolerated the procedure well. There were no complications. Post procedure instructions were given.   ASSESSMENT & PLAN:  See problem based charting & AVS for pt instructions. Pain in joint of left shoulder Did extremely well with CSI last done in this shoulder 2019. Repeat today, HEP F/u prn She has a lot of weed eating planned so she is cautioned not to overdo as that is awkward position for shoulder

## 2023-10-23 ENCOUNTER — Ambulatory Visit: Attending: Internal Medicine | Admitting: Internal Medicine

## 2023-10-23 ENCOUNTER — Telehealth: Payer: Self-pay | Admitting: Pharmacy Technician

## 2023-10-23 VITALS — BP 108/63 | Ht 63.0 in | Wt 197.0 lb

## 2023-10-23 DIAGNOSIS — R011 Cardiac murmur, unspecified: Secondary | ICD-10-CM | POA: Diagnosis not present

## 2023-10-23 DIAGNOSIS — R079 Chest pain, unspecified: Secondary | ICD-10-CM

## 2023-10-23 DIAGNOSIS — I10 Essential (primary) hypertension: Secondary | ICD-10-CM | POA: Diagnosis not present

## 2023-10-23 DIAGNOSIS — R002 Palpitations: Secondary | ICD-10-CM | POA: Diagnosis not present

## 2023-10-23 DIAGNOSIS — R072 Precordial pain: Secondary | ICD-10-CM

## 2023-10-23 MED ORDER — IVABRADINE HCL 7.5 MG PO TABS: 15.0000 mg | ORAL_TABLET | Freq: Once | ORAL | 0 refills | Status: AC

## 2023-10-23 NOTE — Telephone Encounter (Signed)
 Pharmacy Patient Advocate Encounter  Received notification from [employer] that Prior Authorization for ivabraidne has been DENIED.  Due to only being 2 tablets   I called the pharmacy and they are filling this for 17.00

## 2023-10-23 NOTE — Patient Instructions (Signed)
 Medication Instructions:  Your physician recommends that you continue on your current medications as directed. Please refer to the Current Medication list given to you today.  *If you need a refill on your cardiac medications before your next appointment, please call your pharmacy*  Lab Work: NONE  If you have labs (blood work) drawn today and your tests are completely normal, you will receive your results only by: MyChart Message (if you have MyChart) OR A paper copy in the mail If you have any lab test that is abnormal or we need to change your treatment, we will call you to review the results.  Testing/Procedures: Your physician has requested that you have an echocardiogram. Echocardiography is a painless test that uses sound waves to create images of your heart. It provides your doctor with information about the size and shape of your heart and how well your heart's chambers and valves are working. This procedure takes approximately one hour. There are no restrictions for this procedure. Please do NOT wear cologne, perfume, aftershave, or lotions (deodorant is allowed). Please arrive 15 minutes prior to your appointment time.  Please note: We ask at that you not bring children with you during ultrasound (echo/ vascular) testing. Due to room size and safety concerns, children are not allowed in the ultrasound rooms during exams. Our front office staff cannot provide observation of children in our lobby area while testing is being conducted. An adult accompanying a patient to their appointment will only be allowed in the ultrasound room at the discretion of the ultrasound technician under special circumstances. We apologize for any inconvenience.  Your physician has requested that you have cardiac CT. Cardiac computed tomography (CT) is a painless test that uses an x-ray machine to take clear, detailed pictures of your heart. For further information please visit [email or web address] Please  follow instruction sheet as given.    Follow-Up:As needed At [facility], you and your health needs are our priority.  As part of our continuing mission to provide you with exceptional heart care, our providers are all part of one team.  This team includes your primary Cardiologist (physician) and Advanced Practice Providers or APPs (Physician Assistants and Nurse Practitioners) who all work together to provide you with the care you need, when you need it.   Provider:  Gloriann Larger, MD    Other Instructions   Your cardiac CT will be scheduled at one of the below locations:   [facility] [address] [place], [place] [address] [phone]  OR  [facility] [address] [place], [place] [address] [phone]  OR   [facility] [address] [place], [place] [address] [phone]  OR   [facility] [address] [place], [place] [address] [phone]  OR   [facility] and Vascular Tower [address]  [place], [place] [address] Opening October 02, 2023  If scheduled at [facility], please arrive at the [facility] and Children's Entrance (Entrance C2) of [facility] 30 minutes prior to test start time. You can use the FREE valet parking offered at entrance C (encouraged to control the heart rate for the test)  Proceed to the [facility] Radiology Department (first floor) to check-in and test prep.   All radiology patients and guests should use entrance C2 at [facility], accessed from [facility], even though the hospital's physical address listed is [address]  [place].    If scheduled at the Heart and Vascular Tower at [address], please enter the parking lot using the [address] entrance and use the FREE valet service at the patient drop-off area. Enter the buidling and check-in with  registration on the main floor.  If scheduled at [facility] or [facility], please arrive 15 mins early for check-in and test prep.  There is spacious parking and easy access to the radiology department from the [facility] Heart and Vascular entrance. Please enter here and check-in with the desk attendant.   If scheduled at [facility], please arrive 30 minutes early for check-in and test prep.  Please follow these instructions carefully (unless otherwise directed):  An IV will be required for this test and Nitroglycerin  will be given.  Hold all erectile dysfunction medications at least 3 days (72 hrs) prior to test. (Ie viagra, cialis, sildenafil, tadalafil, etc)   On the Night Before the Test: Be sure to Drink plenty of water. Do not consume any caffeinated/decaffeinated beverages or chocolate 12 hours prior to your test. Do not take any antihistamines 12 hours prior to your test.   On the Day of the Test: Drink plenty of water until 1 hour prior to the test. Do not eat any food 1 hour prior to test. You may take your regular medications prior to the test.  Take ivabradine  (Corlanor) 15 mg two hours prior to test. If you take Furosemide /Hydrochlorothiazide /Spironolactone/Chlorthalidone, please HOLD on the morning of the test. Patients who wear a continuous glucose monitor MUST remove the device prior to scanning. FEMALES- please wear underwire-free bra if available, avoid dresses & tight clothing        After the Test: Drink plenty of water. After receiving IV contrast, you may experience a mild flushed feeling. This is normal. On occasion, you may experience a mild rash up to 24 hours after the test. This is not dangerous. If this occurs, you can take Benadryl  25 mg, Zyrtec, Claritin, or Allegra and increase your fluid intake. (Patients taking Tikosyn should avoid Benadryl , and may take Zyrtec, Claritin, or Allegra) If you  experience trouble breathing, this can be serious. If it is severe call 911 IMMEDIATELY. If it is mild, please call our office.  We will call to schedule your test 2-4 weeks out understanding that some insurance companies will need an authorization prior to the service being performed.   For more information and frequently asked questions, please visit our website : [email or web address]  For non-scheduling related questions, please contact the cardiac imaging nurse navigator should you have any questions/concerns: Cardiac Imaging Nurse Navigators Direct Office Dial: [phone]   For scheduling needs, including cancellations and rescheduling, please call [place], [phone].

## 2023-10-23 NOTE — Progress Notes (Signed)
 Cardiology Office Note:  .    Date:  10/23/2023  ID:  Lori Bradford, DOB 05/13/1946, MRN [id number] PCP: Lori Companion, MD  [facility] Providers Cardiologist:  None     CC: Chest pain Consulted for the evaluation of Chest pain eval at the behest of Dr. Andree Kayser   History of Present Illness: .    Lori Bradford is a 78 y.o. female with hypertension and aortic atherosclerosis who presents with chest pain.  She experiences chest pain associated with physical activity, such as moving her right shoulder or walking. The pain began a couple of weeks ago and coincides with her arthritis symptoms, particularly in her shoulder and arm.  Her past medical history includes hypertension and aortic atherosclerosis, identified in a CT chest scan performed in 2023, which showed plaque buildup in the aorta but no coronary artery calcifications. An EKG performed recently was normal, similar to one from 2018. She has a history of elevated cholesterol, which remains slightly elevated.  She has experienced adverse effects from cholesterol medications in the past, including severe neck pain that radiated upwards and was described as worse than a migraine. This was managed with gabapentin , which alleviated her symptoms and improved her neck mobility.  She has a history of arthritis affecting multiple joints, including her shoulder and head, which she believes contributes to her pain. She also mentions having hip replacements and swelling in her legs, which she attributes to orthopedic issues.  In her social history, she mentions living in [place] and previously residing in [place], which is how she established care with her current doctors.  Discussed the use of AI scribe software for clinical note transcription with the patient, who gave verbal consent to proceed.   Relevant histories: .  Social  - chest pain that started when her ex-husband started moving back in ROS: As per HPI.   Studies  Reviewed: Lori Bradford    RADIOLOGY CT chest: Aortic atherosclerosis. Fibrosis without evidence of coronary artery calcifications. (2023) CT chest: Minimal aortic valve calcification (11/09/2020)  Physical Exam:    VS:  BP 108/63 (BP Location: Right Arm)   Ht 5\' 3"  (1.6 m)   Wt 197 lb (89.4 kg)   SpO2 98%   BMI 34.90 kg/m    Wt Readings from Last 3 Encounters:  10/23/23 197 lb (89.4 kg)  10/13/23 196 lb (88.9 kg)  06/15/23 196 lb (88.9 kg)    Gen: no distress,  morbid obesity   Neck: No JVD Cardiac: No Rubs or Gallops, systolic murmur, RRR +2 radial pulses Respiratory: Clear to auscultation bilaterally, normal effort, normal  respiratory rate GI: Soft, nontender, non-distended  MS: No  edema;  moves all extremities Integument: Skin feels warm Neuro:  At time of evaluation, alert and oriented to person/place/time/situation  Psych: Normal affect, patient feels ok   ASSESSMENT AND PLAN: .     An EKG was ordered for CP and shows Sinus rhythm  Chest pain Intermittent chest pain, likely musculoskeletal in origin, associated with movement and possibly related to shoulder arthritis. No recent episodes. Normal EKG and previous CT chest showing aortic atherosclerosis without coronary artery calcifications. Stress related to personal circumstances may contribute to symptoms. - Order cardiac CT scan to evaluate for blockages and assess valve condition - Order echocardiogram to evaluate heart function and valve status  Heart murmur Heart murmur noted on examination, with previous evidence of valve calcification on CT scan in 2022. Further evaluation needed to assess severity and  implications. Cardiac CT and echocardiogram will provide detailed assessment of valve and arterial condition. - Order cardiac CT scan to evaluate valve condition - Order echocardiogram to assess heart function and valve status  HLD Aortic atherosclerosis Aortic atherosclerosis identified on previous CT scan in 2022,  with no evidence of coronary artery calcifications at that time. Monitoring for potential progression is necessary. - LDL goal < 70  Hypertension Hypertension well-controlled with normal blood pressure readings during the visit. No change in therapy  Arthritis Arthritis affecting multiple joints, including the shoulder, which may contribute to chest pain. Managed with gabapentin , which has improved symptoms.  IF no cardiac findings PRN f/u    Lori Larger, MD FASE [facility] Cardiologist [place]  [address] [place], [address] [place], [place] [address] [phone]  1:03 PM

## 2023-10-26 ENCOUNTER — Other Ambulatory Visit: Payer: Self-pay | Admitting: Family Medicine

## 2023-10-26 DIAGNOSIS — G4700 Insomnia, unspecified: Secondary | ICD-10-CM

## 2023-10-26 DIAGNOSIS — F341 Dysthymic disorder: Secondary | ICD-10-CM

## 2023-11-08 ENCOUNTER — Telehealth (HOSPITAL_COMMUNITY): Payer: Self-pay | Admitting: *Deleted

## 2023-11-08 NOTE — Telephone Encounter (Signed)
 Attempted to call patient regarding upcoming cardiac CT appointment. Left message on voicemail with name and callback number  Larey Brick RN Navigator Cardiac Imaging [facility] Heart and Vascular Services [phone] Office [phone] Cell

## 2023-11-09 ENCOUNTER — Ambulatory Visit (HOSPITAL_COMMUNITY): Admission: RE | Admit: 2023-11-09 | Source: Ambulatory Visit

## 2023-11-17 ENCOUNTER — Other Ambulatory Visit: Payer: Self-pay | Admitting: Family Medicine

## 2023-11-17 DIAGNOSIS — I10 Essential (primary) hypertension: Secondary | ICD-10-CM

## 2023-11-26 ENCOUNTER — Other Ambulatory Visit: Payer: Self-pay | Admitting: Physician Assistant

## 2023-11-27 ENCOUNTER — Other Ambulatory Visit: Payer: Self-pay | Admitting: Family Medicine

## 2023-11-27 DIAGNOSIS — G4700 Insomnia, unspecified: Secondary | ICD-10-CM

## 2023-11-27 DIAGNOSIS — F341 Dysthymic disorder: Secondary | ICD-10-CM

## 2023-12-01 ENCOUNTER — Telehealth (HOSPITAL_COMMUNITY): Payer: Self-pay | Admitting: Internal Medicine

## 2023-12-01 NOTE — Telephone Encounter (Signed)
 12/01/23 Patient left an irrate message that she wasnt coming for appt  for echocardiogram and to cancel.  She states she does not need and does not want us  to call her anymore regarding scheduling.  Order will be removed from the echo WQ. 8:21 am

## 2023-12-04 ENCOUNTER — Ambulatory Visit (HOSPITAL_COMMUNITY)

## 2023-12-28 ENCOUNTER — Other Ambulatory Visit: Payer: Self-pay | Admitting: Family Medicine

## 2023-12-28 DIAGNOSIS — F341 Dysthymic disorder: Secondary | ICD-10-CM

## 2023-12-28 DIAGNOSIS — G4700 Insomnia, unspecified: Secondary | ICD-10-CM

## 2024-01-20 ENCOUNTER — Other Ambulatory Visit: Payer: Self-pay | Admitting: Physician Assistant

## 2024-01-24 ENCOUNTER — Other Ambulatory Visit: Payer: Self-pay | Admitting: Family Medicine

## 2024-01-24 DIAGNOSIS — G4700 Insomnia, unspecified: Secondary | ICD-10-CM

## 2024-01-24 DIAGNOSIS — F341 Dysthymic disorder: Secondary | ICD-10-CM
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
## 2024-02-06 ENCOUNTER — Ambulatory Visit: Payer: 59 | Admitting: Neurology

## 2024-02-06 ENCOUNTER — Encounter: Payer: Self-pay | Admitting: Internal Medicine

## 2024-02-07 ENCOUNTER — Encounter: Payer: Self-pay | Admitting: Neurology

## 2024-02-07 ENCOUNTER — Ambulatory Visit (INDEPENDENT_AMBULATORY_CARE_PROVIDER_SITE_OTHER): Admitting: Neurology

## 2024-02-07 VITALS — BP 149/67 | HR 81 | Ht 63.0 in | Wt 201.0 lb

## 2024-02-07 DIAGNOSIS — G4486 Cervicogenic headache: Secondary | ICD-10-CM | POA: Diagnosis not present

## 2024-02-07 MED ORDER — GABAPENTIN 100 MG PO CAPS: 100.0000 mg | ORAL_CAPSULE | Freq: Three times a day (TID) | ORAL | 11 refills | Status: AC

## 2024-02-07 NOTE — Patient Instructions (Signed)
 Continue gabapentin  100mg  in morning, 100mg  at 2 PM and 100mg  at bedtime

## 2024-02-20 ENCOUNTER — Other Ambulatory Visit: Payer: Self-pay | Admitting: Family Medicine

## 2024-02-20 DIAGNOSIS — I10 Essential (primary) hypertension: Secondary | ICD-10-CM

## 2024-02-26 ENCOUNTER — Other Ambulatory Visit: Payer: Self-pay | Admitting: Family Medicine

## 2024-02-26 DIAGNOSIS — G4700 Insomnia, unspecified: Secondary | ICD-10-CM

## 2024-02-26 DIAGNOSIS — F341 Dysthymic disorder: Secondary | ICD-10-CM

## 2024-03-11 ENCOUNTER — Other Ambulatory Visit: Payer: Self-pay | Admitting: Surgical

## 2024-03-11 ENCOUNTER — Ambulatory Visit (INDEPENDENT_AMBULATORY_CARE_PROVIDER_SITE_OTHER)

## 2024-03-11 VITALS — Ht 63.0 in | Wt 201.0 lb

## 2024-03-11 DIAGNOSIS — Z0000 Encounter for general adult medical examination without abnormal findings: Secondary | ICD-10-CM | POA: Diagnosis not present
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
## 2024-03-11 NOTE — Patient Instructions (Signed)
 Lori Bradford,  Thank you for taking the time for your Medicare Wellness Visit. I appreciate your continued commitment to your health goals. Please review the care plan we discussed, and feel free to reach out if I can assist you further.  Medicare recommends these wellness visits once per year to help you and your care team stay ahead of potential health issues. These visits are designed to focus on prevention, allowing your provider to concentrate on managing your acute and chronic conditions during your regular appointments.  Please note that Annual Wellness Visits do not include a physical exam. Some assessments may be limited, especially if the visit was conducted virtually. If needed, we may recommend a separate in-person follow-up with your provider.  Ongoing Care Seeing your primary care provider every 3 to 6 months helps us  monitor your health and provide consistent, personalized care.   Referrals If a referral was made during today's visit and you haven't received any updates within two weeks, please contact the referred provider directly to check on the status.  Recommended Screenings:  Health Maintenance  Topic Date Due   Breast Cancer Screening  06/02/2021   Flu Shot  01/05/2024   COVID-19 Vaccine (5 - 2025-26 season) 02/05/2024   Medicare Annual Wellness Visit  03/11/2025   DTaP/Tdap/Td vaccine (3 - Td or Tdap) 04/02/2032   Pneumococcal Vaccine for age over 45  Completed   DEXA scan (bone density measurement)  Completed   Hepatitis C Screening  Completed   Zoster (Shingles) Vaccine  Completed   Meningitis B Vaccine  Aged Out       03/11/2024    4:05 PM  Advanced Directives  Does Patient Have a Medical Advance Directive? No  Would patient like information on creating a medical advance directive? Yes (MAU/Ambulatory/Procedural Areas - Information given)   Advance Care Planning is important because it: Ensures you receive medical care that aligns with your values, goals,  and preferences. Provides guidance to your family and loved ones, reducing the emotional burden of decision-making during critical moments.  Vision: Annual vision screenings are recommended for early detection of glaucoma, cataracts, and diabetic retinopathy. These exams can also reveal signs of chronic conditions such as diabetes and high blood pressure.  Dental: Annual dental screenings help detect early signs of oral cancer, gum disease, and other conditions linked to overall health, including heart disease and diabetes.  Please see the attached documents for additional preventive care recommendations.

## 2024-03-11 NOTE — Telephone Encounter (Signed)
Gil patient 

## 2024-03-26 ENCOUNTER — Other Ambulatory Visit: Payer: Self-pay | Admitting: Family Medicine

## 2024-03-26 DIAGNOSIS — G4700 Insomnia, unspecified: Secondary | ICD-10-CM

## 2024-03-26 DIAGNOSIS — F341 Dysthymic disorder: Secondary | ICD-10-CM

## 2024-04-12 ENCOUNTER — Ambulatory Visit: Admitting: Family Medicine

## 2024-04-12 VITALS — BP 137/82 | Ht 63.0 in | Wt 200.0 lb

## 2024-04-12 DIAGNOSIS — M25512 Pain in left shoulder: Secondary | ICD-10-CM | POA: Diagnosis not present

## 2024-04-12 DIAGNOSIS — R252 Cramp and spasm: Secondary | ICD-10-CM

## 2024-04-12 MED ORDER — METHYLPREDNISOLONE ACETATE 40 MG/ML IJ SUSP
40.0000 mg | Freq: Once | INTRAMUSCULAR | Status: AC
  Administered 2024-04-12: 40 mg via INTRA_ARTICULAR

## 2024-04-12 NOTE — Patient Instructions (Signed)
 Located in: [facility] Address: [address] [place], [place], [place] [address]  Today you received an injection with corticosteroid. This injection is usually done in response to pain and inflammation. There is some numbing medicine also in the shot so the injected area may be numb and feel really good for the next couple of hours. The numbing medicine usually wears off in 2-3 hours though, and then your pain level will be right back where it was before the injection.   The actually benefit from the steroid injection is usually noticed in 2-7 days. You may actually experience a small (as in 10%) INCREASE in pain in the first 24 hours---that is common.   Things to watch out for that you should contact us  or a health care provider urgently would include: 1. Unusual (as in more than 10%) increase in pain 2. New fever > 101.5 3. New swelling or redness of the injected area.  4. Streaking of red lines around the area injected.

## 2024-04-13 NOTE — Progress Notes (Signed)
  Lori Bradford - 78 y.o. female MRN [id number]  Date of birth: May 18, 1946    SUBJECTIVE:      Chief Complaint:/ HPI:  #1.  Left shoulder pain getting worse over the last month.  It has been quite a while since she had an injection she would like to consider 1  today.  #2.  Leg cramps particularly in the left calf.  She is status post total knee replacement several years ago and this leg and wonders if that contributes to her recent issues with cramps.  They usually occur once or twice a week usually at night when she goes to bed.  She has been taking an over-the-counter medication that she brings with her.  Wants to know if this is an okay medicine to take.  Main ingredient is Arnica.    OBJECTIVE: BP 137/82   Ht 5' 3 (1.6 m)   Wt 200 lb (90.7 kg)   BMI 35.43 kg/m   Physical Exam:  Vital signs are reviewed. GENERAL: Well-developed no acute distress SHOULDERS: Symmetrical.  Left shoulder pain with abduction above 90 degrees, forward flexion above 90 degrees.  Inability to place the left hand to her midline back, she can only get to the posterior left hip.  Distally neurovascularly intact. KNEE: Left.  Well-healed anterior incision.  Full range of motion flexion extension.  Calf is soft.  No mass.  No swelling noted.  No unusual warmth.  She has normal dorsiflexion and plantarflexion at the ankle.  No movement is painful today.   PROCEDURE: INJECTION: Patient was given informed consent, signed copy in the chart. Appropriate time out was taken. Area prepped and draped in usual sterile fashion. Ethyl chloride was  used for local anesthesia. A 21 gauge 1 1/2 inch needle was used..  1 cc of methylprednisolone  40 mg/ml plus 3 cc of 1% lidocaine  without epinephrine was injected into the left subacromial bursa using a(n) posterior approach.   The patient tolerated the procedure well. There were no complications. Post procedure instructions were given.  ASSESSMENT & PLAN: #1.  Left shoulder  pain, rotator cuff tendinopathy.  Subacromial corticosteroid injection today.  Home exercise program. 2.  Unilateral leg cramps, intermittent.  I do not see any problem with the medication she is taking over-the-counter.  States she just takes it when she has cramps and that seems to help.  I reviewed her med list and do not see any interactions.  She will follow-up as needed. See problem based charting & AVS for pt instructions. No problem-specific Assessment & Plan notes found for this encounter.

## 2024-05-11 ENCOUNTER — Other Ambulatory Visit: Payer: Self-pay | Admitting: Physician Assistant

## 2024-05-11 ENCOUNTER — Other Ambulatory Visit: Payer: Self-pay | Admitting: Family Medicine

## 2024-05-11 DIAGNOSIS — M19041 Primary osteoarthritis, right hand: Secondary | ICD-10-CM

## 2024-05-11 DIAGNOSIS — M542 Cervicalgia: Secondary | ICD-10-CM

## 2024-05-25 ENCOUNTER — Other Ambulatory Visit: Payer: Self-pay | Admitting: Family Medicine

## 2024-05-25 DIAGNOSIS — I10 Essential (primary) hypertension: Secondary | ICD-10-CM

## 2024-06-24 ENCOUNTER — Other Ambulatory Visit: Payer: Self-pay

## 2024-06-24 ENCOUNTER — Ambulatory Visit: Admitting: Physician Assistant

## 2024-06-24 ENCOUNTER — Encounter: Payer: Self-pay | Admitting: Physician Assistant

## 2024-06-24 DIAGNOSIS — M7989 Other specified soft tissue disorders: Secondary | ICD-10-CM

## 2024-06-24 DIAGNOSIS — M7661 Achilles tendinitis, right leg: Secondary | ICD-10-CM

## 2024-06-24 DIAGNOSIS — M79662 Pain in left lower leg: Secondary | ICD-10-CM
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
## 2024-06-29 ENCOUNTER — Other Ambulatory Visit: Payer: Self-pay | Admitting: Orthopaedic Surgery

## 2024-07-06 ENCOUNTER — Other Ambulatory Visit: Payer: Self-pay | Admitting: Family Medicine

## 2024-07-15 ENCOUNTER — Ambulatory Visit (HOSPITAL_COMMUNITY)

## 2024-08-05 ENCOUNTER — Ambulatory Visit: Admitting: Physician Assistant

## 2025-02-06 ENCOUNTER — Ambulatory Visit: Admitting: Neurology
